# Patient Record
Sex: Female | Born: 1950
Health system: Southern US, Community
[De-identification: ages and names within clinical notes are randomized; demographics above are authoritative.]

## PROBLEM LIST (undated history)

## (undated) ENCOUNTER — Emergency Department (HOSPITAL_COMMUNITY): Payer: PRIVATE HEALTH INSURANCE | Source: Home / Self Care

## (undated) DIAGNOSIS — F419 Anxiety disorder, unspecified: Secondary | ICD-10-CM

## (undated) DIAGNOSIS — R42 Dizziness and giddiness: Secondary | ICD-10-CM

## (undated) DIAGNOSIS — Z972 Presence of dental prosthetic device (complete) (partial): Secondary | ICD-10-CM

## (undated) DIAGNOSIS — E785 Hyperlipidemia, unspecified: Secondary | ICD-10-CM

## (undated) DIAGNOSIS — Z794 Long term (current) use of insulin: Secondary | ICD-10-CM

## (undated) DIAGNOSIS — M1990 Unspecified osteoarthritis, unspecified site: Secondary | ICD-10-CM

## (undated) DIAGNOSIS — G8929 Other chronic pain: Secondary | ICD-10-CM

## (undated) DIAGNOSIS — F329 Major depressive disorder, single episode, unspecified: Secondary | ICD-10-CM

## (undated) DIAGNOSIS — R5383 Other fatigue: Secondary | ICD-10-CM

## (undated) DIAGNOSIS — G4700 Insomnia, unspecified: Secondary | ICD-10-CM

## (undated) DIAGNOSIS — I10 Essential (primary) hypertension: Secondary | ICD-10-CM

## (undated) DIAGNOSIS — E119 Type 2 diabetes mellitus without complications: Secondary | ICD-10-CM

## (undated) DIAGNOSIS — N182 Chronic kidney disease, stage 2 (mild): Secondary | ICD-10-CM

## (undated) DIAGNOSIS — G629 Polyneuropathy, unspecified: Secondary | ICD-10-CM

## (undated) DIAGNOSIS — E1140 Type 2 diabetes mellitus with diabetic neuropathy, unspecified: Secondary | ICD-10-CM

## (undated) DIAGNOSIS — F32A Depression, unspecified: Secondary | ICD-10-CM

## (undated) HISTORY — DX: Polyneuropathy, unspecified: G62.9

## (undated) HISTORY — DX: Major depressive disorder, single episode, unspecified: F32.9

## (undated) HISTORY — DX: Other fatigue: R53.83

## (undated) HISTORY — DX: Insomnia, unspecified: G47.00

## (undated) HISTORY — DX: Type 2 diabetes mellitus with diabetic neuropathy, unspecified: E11.40

## (undated) HISTORY — PX: ABDOMINAL HYSTERECTOMY: SHX81

## (undated) HISTORY — DX: Other chronic pain: G89.29

## (undated) HISTORY — DX: Long term (current) use of insulin: Z79.4

## (undated) HISTORY — DX: Chronic kidney disease, stage 2 (mild): N18.2

## (undated) HISTORY — DX: Hyperlipidemia, unspecified: E78.5

## (undated) HISTORY — DX: Essential (primary) hypertension: I10

## (undated) HISTORY — DX: Depression, unspecified: F32.A

## (undated) HISTORY — DX: Type 2 diabetes mellitus without complications: E11.9

## (undated) HISTORY — PX: COLON SURGERY: SHX602

## (undated) HISTORY — DX: Anxiety disorder, unspecified: F41.9

---

## 2012-10-20 ENCOUNTER — Inpatient Hospital Stay: Payer: Self-pay | Admitting: Specialist

## 2012-10-20 LAB — COMPREHENSIVE METABOLIC PANEL
Albumin: 4.1 g/dL (ref 3.4–5.0)
Alkaline Phosphatase: 101 U/L (ref 50–136)
Anion Gap: 13 (ref 7–16)
BUN: 15 mg/dL (ref 7–18)
Bilirubin,Total: 0.5 mg/dL (ref 0.2–1.0)
Calcium, Total: 9.3 mg/dL (ref 8.5–10.1)
Co2: 21 mmol/L (ref 21–32)
Creatinine: 0.97 mg/dL (ref 0.60–1.30)
EGFR (African American): >60
Osmolality: 286 (ref 275–301)
SGOT(AST): 11 U/L — ABNORMAL LOW (ref 15–37)
Sodium: 136 mmol/L (ref 136–145)

## 2012-10-20 LAB — URINALYSIS, COMPLETE
Bilirubin,UR: NEGATIVE
Blood: NEGATIVE
Ketone: NEGATIVE
Nitrite: NEGATIVE
Specific Gravity: 1.015 (ref 1.003–1.030)

## 2012-10-20 LAB — IRON AND TIBC: Iron Saturation: 3 %

## 2012-10-20 LAB — RETICULOCYTES: Reticulocyte: 2.93 % — ABNORMAL HIGH (ref 0.5–2.2)

## 2012-10-20 LAB — CBC
HCT: 23.4 % — ABNORMAL LOW (ref 35.0–47.0)
HGB: 6.6 g/dL — ABNORMAL LOW (ref 12.0–16.0)
MCV: 61 fL — ABNORMAL LOW (ref 80–100)
RBC: 3.81 10*6/uL (ref 3.80–5.20)
RDW: 18.9 % — ABNORMAL HIGH (ref 11.5–14.5)
WBC: 4.7 10*3/uL (ref 3.6–11.0)

## 2012-10-20 LAB — FERRITIN: Ferritin (ARMC): 3 ng/mL — ABNORMAL LOW (ref 8–388)

## 2012-10-20 LAB — FOLATE: Folic Acid: 16.4 ng/mL (ref 3.1–100.0)

## 2012-10-21 LAB — CBC WITH DIFFERENTIAL/PLATELET
Basophil #: 0 10*3/uL (ref 0.0–0.1)
Basophil %: 0.4 %
Comment - H1-Com5: NORMAL
Eosinophil #: 0 10*3/uL (ref 0.0–0.7)
Eosinophil %: 0.6 %
Eosinophil: 2 %
HCT: 21.5 % — ABNORMAL LOW (ref 35.0–47.0)
Lymphocyte %: 19.6 %
Lymphocytes: 19 %
MCHC: 30.8 g/dL — ABNORMAL LOW (ref 32.0–36.0)
Monocytes: 2 %
Neutrophil %: 72.8 %
RDW: 22 % — ABNORMAL HIGH (ref 11.5–14.5)
Segmented Neutrophils: 77 %
WBC: 4.7 10*3/uL (ref 3.6–11.0)

## 2012-10-21 LAB — HEMOGLOBIN: HGB: 8.9 g/dL — ABNORMAL LOW (ref 12.0–16.0)

## 2012-10-22 LAB — URINE CULTURE

## 2012-11-27 ENCOUNTER — Ambulatory Visit: Payer: Self-pay | Admitting: Emergency Medicine

## 2012-11-27 LAB — CBC WITH DIFFERENTIAL/PLATELET
Basophil #: 0 10*3/uL (ref 0.0–0.1)
Basophil %: 0.5 %
Eosinophil #: 0.1 10*3/uL (ref 0.0–0.7)
Eosinophil %: 2.1 %
HCT: 32.6 % — ABNORMAL LOW (ref 35.0–47.0)
HGB: 10.5 g/dL — ABNORMAL LOW (ref 12.0–16.0)
Lymphocyte %: 19.7 %
MCH: 25.4 pg — ABNORMAL LOW (ref 26.0–34.0)
MCHC: 32.4 g/dL (ref 32.0–36.0)
MCV: 79 fL — ABNORMAL LOW (ref 80–100)
Monocyte %: 5.8 %
Neutrophil #: 4.4 10*3/uL (ref 1.4–6.5)
RBC: 4.15 10*6/uL (ref 3.80–5.20)
RDW: 27.5 % — ABNORMAL HIGH (ref 11.5–14.5)
WBC: 6.1 10*3/uL (ref 3.6–11.0)

## 2012-11-27 LAB — BASIC METABOLIC PANEL
BUN: 14 mg/dL (ref 7–18)
Creatinine: 0.75 mg/dL (ref 0.60–1.30)
Potassium: 3.7 mmol/L (ref 3.5–5.1)
Sodium: 138 mmol/L (ref 136–145)

## 2012-11-28 ENCOUNTER — Inpatient Hospital Stay: Payer: Self-pay | Admitting: Emergency Medicine

## 2012-11-29 LAB — HEMOGLOBIN
HGB: 9.7 g/dL — ABNORMAL LOW (ref 12.0–16.0)
HGB: 9.6 g/dL — ABNORMAL LOW (ref 12.0–16.0)

## 2012-11-29 LAB — HEMOGLOBIN A1C: Hemoglobin A1C: 7.1 % — ABNORMAL HIGH (ref 4.2–6.3)

## 2012-11-30 DIAGNOSIS — I517 Cardiomegaly: Secondary | ICD-10-CM

## 2012-11-30 LAB — COMPREHENSIVE METABOLIC PANEL
Albumin: 2.9 g/dL — ABNORMAL LOW (ref 3.4–5.0)
Alkaline Phosphatase: 68 U/L (ref 50–136)
Anion Gap: 6 — ABNORMAL LOW (ref 7–16)
BUN: 8 mg/dL (ref 7–18)
Bilirubin,Total: 0.8 mg/dL (ref 0.2–1.0)
Calcium, Total: 8.4 mg/dL — ABNORMAL LOW (ref 8.5–10.1)
Chloride: 102 mmol/L (ref 98–107)
Co2: 31 mmol/L (ref 21–32)
Creatinine: 0.73 mg/dL (ref 0.60–1.30)
EGFR (African American): >60
EGFR (Non-African Amer.): >60
Glucose: 153 mg/dL — ABNORMAL HIGH (ref 65–99)
Osmolality: 279 (ref 275–301)
Potassium: 3.1 mmol/L — ABNORMAL LOW (ref 3.5–5.1)
SGOT(AST): 20 U/L (ref 15–37)
SGPT (ALT): 18 U/L (ref 12–78)
Sodium: 139 mmol/L (ref 136–145)
Total Protein: 6.8 g/dL (ref 6.4–8.2)

## 2012-11-30 LAB — CBC WITH DIFFERENTIAL/PLATELET
Eosinophil #: 0 10*3/uL (ref 0.0–0.7)
Eosinophil %: 0.6 %
HCT: 28.2 % — ABNORMAL LOW (ref 35.0–47.0)
Lymphocyte #: 0.4 10*3/uL — ABNORMAL LOW (ref 1.0–3.6)
Lymphocyte %: 6.8 %
MCHC: 33 g/dL (ref 32.0–36.0)
MCV: 79 fL — ABNORMAL LOW (ref 80–100)
Neutrophil #: 5.6 10*3/uL (ref 1.4–6.5)
Neutrophil %: 87.9 %
RBC: 3.57 10*6/uL — ABNORMAL LOW (ref 3.80–5.20)
RDW: 27 % — ABNORMAL HIGH (ref 11.5–14.5)

## 2012-12-01 LAB — BASIC METABOLIC PANEL
Anion Gap: 5 — ABNORMAL LOW (ref 7–16)
Calcium, Total: 8.3 mg/dL — ABNORMAL LOW (ref 8.5–10.1)
Creatinine: 0.6 mg/dL (ref 0.60–1.30)
EGFR (Non-African Amer.): >60
Osmolality: 281 (ref 275–301)
Potassium: 3.3 mmol/L — ABNORMAL LOW (ref 3.5–5.1)
Sodium: 141 mmol/L (ref 136–145)

## 2012-12-01 LAB — PATHOLOGY REPORT

## 2012-12-02 LAB — BASIC METABOLIC PANEL
Anion Gap: 3 — ABNORMAL LOW (ref 7–16)
Co2: 33 mmol/L — ABNORMAL HIGH (ref 21–32)
Osmolality: 279 (ref 275–301)
Sodium: 139 mmol/L (ref 136–145)

## 2012-12-06 LAB — CULTURE, BLOOD (SINGLE)

## 2012-12-26 ENCOUNTER — Ambulatory Visit: Payer: Self-pay | Admitting: Oncology

## 2012-12-27 ENCOUNTER — Ambulatory Visit: Payer: Self-pay | Admitting: Oncology

## 2012-12-27 LAB — CBC CANCER CENTER
Eosinophil #: 0 x10 3/mm (ref 0.0–0.7)
Eosinophil %: 0.4 %
MCH: 25.8 pg — ABNORMAL LOW (ref 26.0–34.0)
MCHC: 33.1 g/dL (ref 32.0–36.0)
Monocyte %: 6.5 %
Neutrophil #: 5.8 x10 3/mm (ref 1.4–6.5)
Platelet: 200 x10 3/mm (ref 150–440)
RBC: 4.8 10*6/uL (ref 3.80–5.20)

## 2012-12-27 LAB — COMPREHENSIVE METABOLIC PANEL
Alkaline Phosphatase: 96 U/L (ref 50–136)
BUN: 11 mg/dL (ref 7–18)
Co2: 30 mmol/L (ref 21–32)
Creatinine: 0.9 mg/dL (ref 0.60–1.30)
EGFR (Non-African Amer.): >60
Osmolality: 276 (ref 275–301)
SGOT(AST): 11 U/L — ABNORMAL LOW (ref 15–37)
SGPT (ALT): 19 U/L (ref 12–78)
Total Protein: 8.5 g/dL — ABNORMAL HIGH (ref 6.4–8.2)

## 2012-12-29 LAB — CEA: CEA: 4.6 ng/mL (ref 0.0–4.7)

## 2013-01-02 ENCOUNTER — Ambulatory Visit: Payer: Self-pay | Admitting: Oncology

## 2013-02-01 ENCOUNTER — Ambulatory Visit: Payer: Self-pay | Admitting: Oncology

## 2013-02-18 ENCOUNTER — Emergency Department: Payer: Self-pay | Admitting: Internal Medicine

## 2013-02-18 LAB — COMPREHENSIVE METABOLIC PANEL
Alkaline Phosphatase: 72 U/L (ref 50–136)
BUN: 8 mg/dL (ref 7–18)
Bilirubin,Total: 0.5 mg/dL (ref 0.2–1.0)
Calcium, Total: 9.4 mg/dL (ref 8.5–10.1)
Chloride: 100 mmol/L (ref 98–107)
Creatinine: 0.81 mg/dL (ref 0.60–1.30)
EGFR (Non-African Amer.): >60
Osmolality: 282 (ref 275–301)
SGOT(AST): 25 U/L (ref 15–37)
Sodium: 135 mmol/L — ABNORMAL LOW (ref 136–145)
Total Protein: 7.8 g/dL (ref 6.4–8.2)

## 2013-02-18 LAB — CBC
HCT: 36.5 % (ref 35.0–47.0)
HGB: 12.2 g/dL (ref 12.0–16.0)
MCHC: 33.3 g/dL (ref 32.0–36.0)
RDW: 15.2 % — ABNORMAL HIGH (ref 11.5–14.5)

## 2013-04-03 ENCOUNTER — Ambulatory Visit: Payer: Self-pay | Admitting: Oncology

## 2013-04-06 ENCOUNTER — Emergency Department: Payer: Self-pay | Admitting: Emergency Medicine

## 2013-04-06 LAB — BASIC METABOLIC PANEL
Co2: 30 mmol/L (ref 21–32)
Creatinine: 0.94 mg/dL (ref 0.60–1.30)
EGFR (Non-African Amer.): >60
Glucose: 226 mg/dL — ABNORMAL HIGH (ref 65–99)
Sodium: 141 mmol/L (ref 136–145)

## 2013-04-06 LAB — CBC WITH DIFFERENTIAL/PLATELET
Basophil %: 0.3 %
Eosinophil #: 0 10*3/uL (ref 0.0–0.7)
HGB: 12.4 g/dL (ref 12.0–16.0)
Lymphocyte #: 1.1 10*3/uL (ref 1.0–3.6)
Lymphocyte %: 18.2 %
MCH: 27.4 pg (ref 26.0–34.0)
MCV: 81 fL (ref 80–100)
Monocyte #: 0.3 x10 3/mm (ref 0.2–0.9)
Monocyte %: 5.4 %
Neutrophil %: 75.6 %
Platelet: 193 10*3/uL (ref 150–440)
RBC: 4.53 10*6/uL (ref 3.80–5.20)
RDW: 16.7 % — ABNORMAL HIGH (ref 11.5–14.5)
WBC: 6.2 10*3/uL (ref 3.6–11.0)

## 2013-04-11 ENCOUNTER — Ambulatory Visit: Payer: Self-pay

## 2013-04-25 LAB — CBC CANCER CENTER
Basophil %: 0.5 %
Eosinophil #: 0 x10 3/mm (ref 0.0–0.7)
Eosinophil %: 0.7 %
HCT: 37.8 % (ref 35.0–47.0)
HGB: 13.4 g/dL (ref 12.0–16.0)
MCHC: 35.3 g/dL (ref 32.0–36.0)
Monocyte %: 6.4 %
Neutrophil %: 60.8 %
Platelet: 197 x10 3/mm (ref 150–440)
RBC: 4.57 10*6/uL (ref 3.80–5.20)
RDW: 17.3 % — ABNORMAL HIGH (ref 11.5–14.5)
WBC: 4.1 x10 3/mm (ref 3.6–11.0)

## 2013-04-25 LAB — COMPREHENSIVE METABOLIC PANEL
Albumin: 3.9 g/dL (ref 3.4–5.0)
Anion Gap: 4 — ABNORMAL LOW (ref 7–16)
Calcium, Total: 9.4 mg/dL (ref 8.5–10.1)
Co2: 35 mmol/L — ABNORMAL HIGH (ref 21–32)
EGFR (African American): >60
Glucose: 239 mg/dL — ABNORMAL HIGH (ref 65–99)
Osmolality: 284 (ref 275–301)
SGPT (ALT): 17 U/L (ref 12–78)
Sodium: 139 mmol/L (ref 136–145)
Total Protein: 7.3 g/dL (ref 6.4–8.2)

## 2013-04-26 LAB — CEA: CEA: 1.9 ng/mL (ref 0.0–4.7)

## 2013-05-04 ENCOUNTER — Ambulatory Visit: Payer: Self-pay | Admitting: Oncology

## 2013-09-11 LAB — HM COLONOSCOPY

## 2013-10-26 ENCOUNTER — Ambulatory Visit: Payer: Self-pay | Admitting: Oncology

## 2013-11-22 ENCOUNTER — Ambulatory Visit: Payer: Self-pay | Admitting: Oncology

## 2013-11-22 LAB — CBC CANCER CENTER
Basophil #: 0 x10 3/mm (ref 0.0–0.1)
Basophil %: 0.4 %
Eosinophil #: 0.1 x10 3/mm (ref 0.0–0.7)
Eosinophil %: 1.7 %
HCT: 40.2 % (ref 35.0–47.0)
HGB: 13.3 g/dL (ref 12.0–16.0)
LYMPHS ABS: 0.8 x10 3/mm — AB (ref 1.0–3.6)
LYMPHS PCT: 15.6 %
MCH: 29.8 pg (ref 26.0–34.0)
MCHC: 33 g/dL (ref 32.0–36.0)
MCV: 90 fL (ref 80–100)
MONO ABS: 0.2 x10 3/mm (ref 0.2–0.9)
Monocyte %: 4.1 %
NEUTROS PCT: 78.2 %
Neutrophil #: 4.1 x10 3/mm (ref 1.4–6.5)
PLATELETS: 180 x10 3/mm (ref 150–440)
RBC: 4.45 10*6/uL (ref 3.80–5.20)
RDW: 13.3 % (ref 11.5–14.5)
WBC: 5.2 x10 3/mm (ref 3.6–11.0)

## 2013-11-22 LAB — COMPREHENSIVE METABOLIC PANEL
ANION GAP: 9 (ref 7–16)
Albumin: 3.9 g/dL (ref 3.4–5.0)
Alkaline Phosphatase: 72 U/L
BILIRUBIN TOTAL: 0.5 mg/dL (ref 0.2–1.0)
BUN: 13 mg/dL (ref 7–18)
CHLORIDE: 95 mmol/L — AB (ref 98–107)
CREATININE: 1 mg/dL (ref 0.60–1.30)
Calcium, Total: 8.6 mg/dL (ref 8.5–10.1)
Co2: 28 mmol/L (ref 21–32)
EGFR (African American): >60
EGFR (Non-African Amer.): 60 — ABNORMAL LOW
Glucose: 537 mg/dL (ref 65–99)
Osmolality: 289 (ref 275–301)
Potassium: 4.6 mmol/L (ref 3.5–5.1)
SGOT(AST): 14 U/L — ABNORMAL LOW (ref 15–37)
SGPT (ALT): 20 U/L (ref 12–78)
Sodium: 132 mmol/L — ABNORMAL LOW (ref 136–145)
TOTAL PROTEIN: 7.5 g/dL (ref 6.4–8.2)

## 2013-11-23 LAB — CEA: CEA: 4.9 ng/mL — ABNORMAL HIGH (ref 0.0–4.7)

## 2013-12-02 ENCOUNTER — Ambulatory Visit: Payer: Self-pay | Admitting: Oncology

## 2014-01-30 ENCOUNTER — Ambulatory Visit: Payer: Self-pay

## 2014-01-30 LAB — HM MAMMOGRAPHY

## 2014-04-09 ENCOUNTER — Telehealth: Payer: Self-pay

## 2014-04-09 NOTE — Telephone Encounter (Signed)
Message opened in error

## 2014-08-16 DIAGNOSIS — E1129 Type 2 diabetes mellitus with other diabetic kidney complication: Secondary | ICD-10-CM | POA: Insufficient documentation

## 2014-08-16 DIAGNOSIS — Z794 Long term (current) use of insulin: Secondary | ICD-10-CM | POA: Insufficient documentation

## 2014-08-16 DIAGNOSIS — E1149 Type 2 diabetes mellitus with other diabetic neurological complication: Secondary | ICD-10-CM | POA: Insufficient documentation

## 2014-08-16 DIAGNOSIS — G629 Polyneuropathy, unspecified: Secondary | ICD-10-CM | POA: Insufficient documentation

## 2014-08-16 DIAGNOSIS — R809 Proteinuria, unspecified: Secondary | ICD-10-CM

## 2014-08-29 IMAGING — CR DG LUMBAR SPINE 2-3V
1 series · 3 of 3 positions shown · non-contrast
Comparison: none

REASON FOR EXAM: back pain
COMMENTS:

PROCEDURE:     DXR - DXR LUMBAR SPINE AP AND LATERAL  - April 06, 2013  [DATE]
RESULT:     Comparison: None

[Series 1: t lumbar spine ap · 0.14mm/px · 3 of 3 slices shown]
[im 1/3]
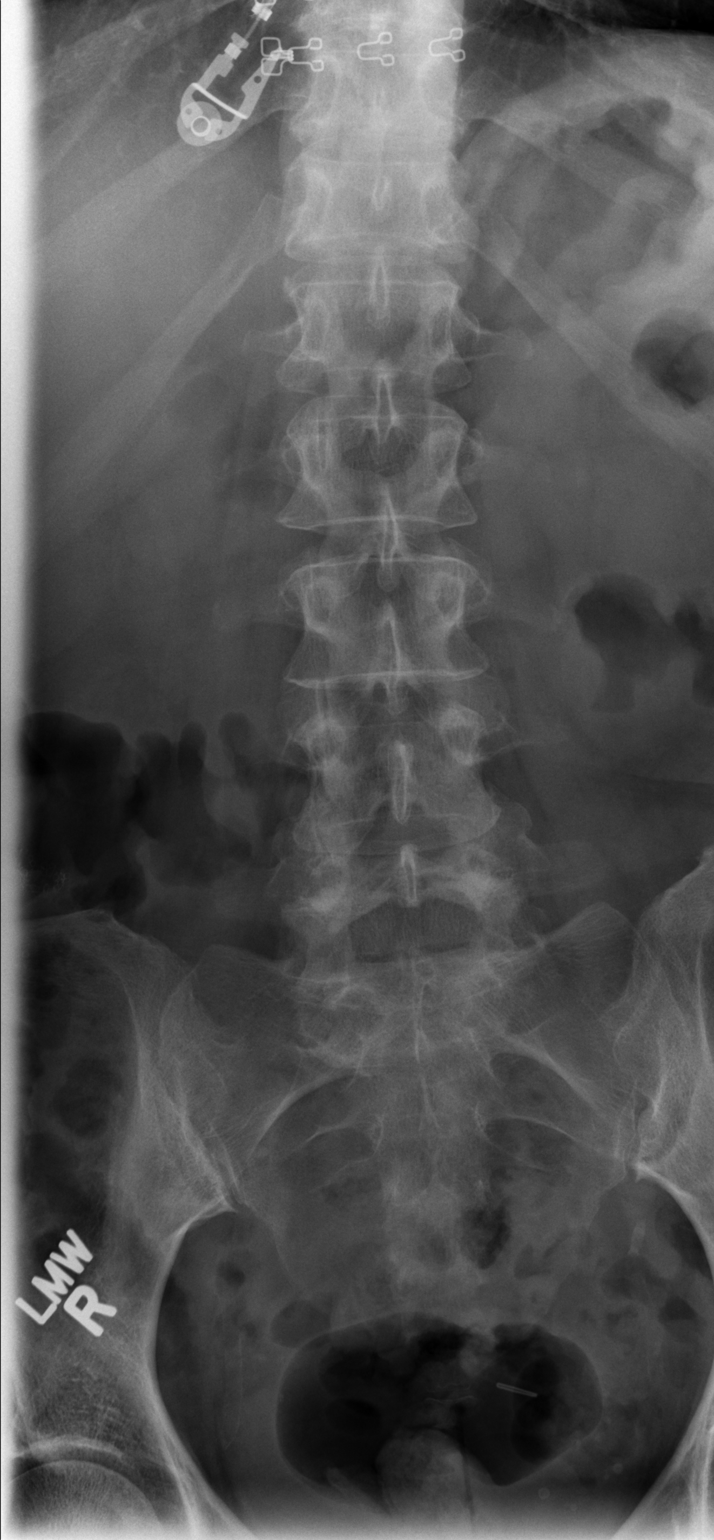
[im 2/3]
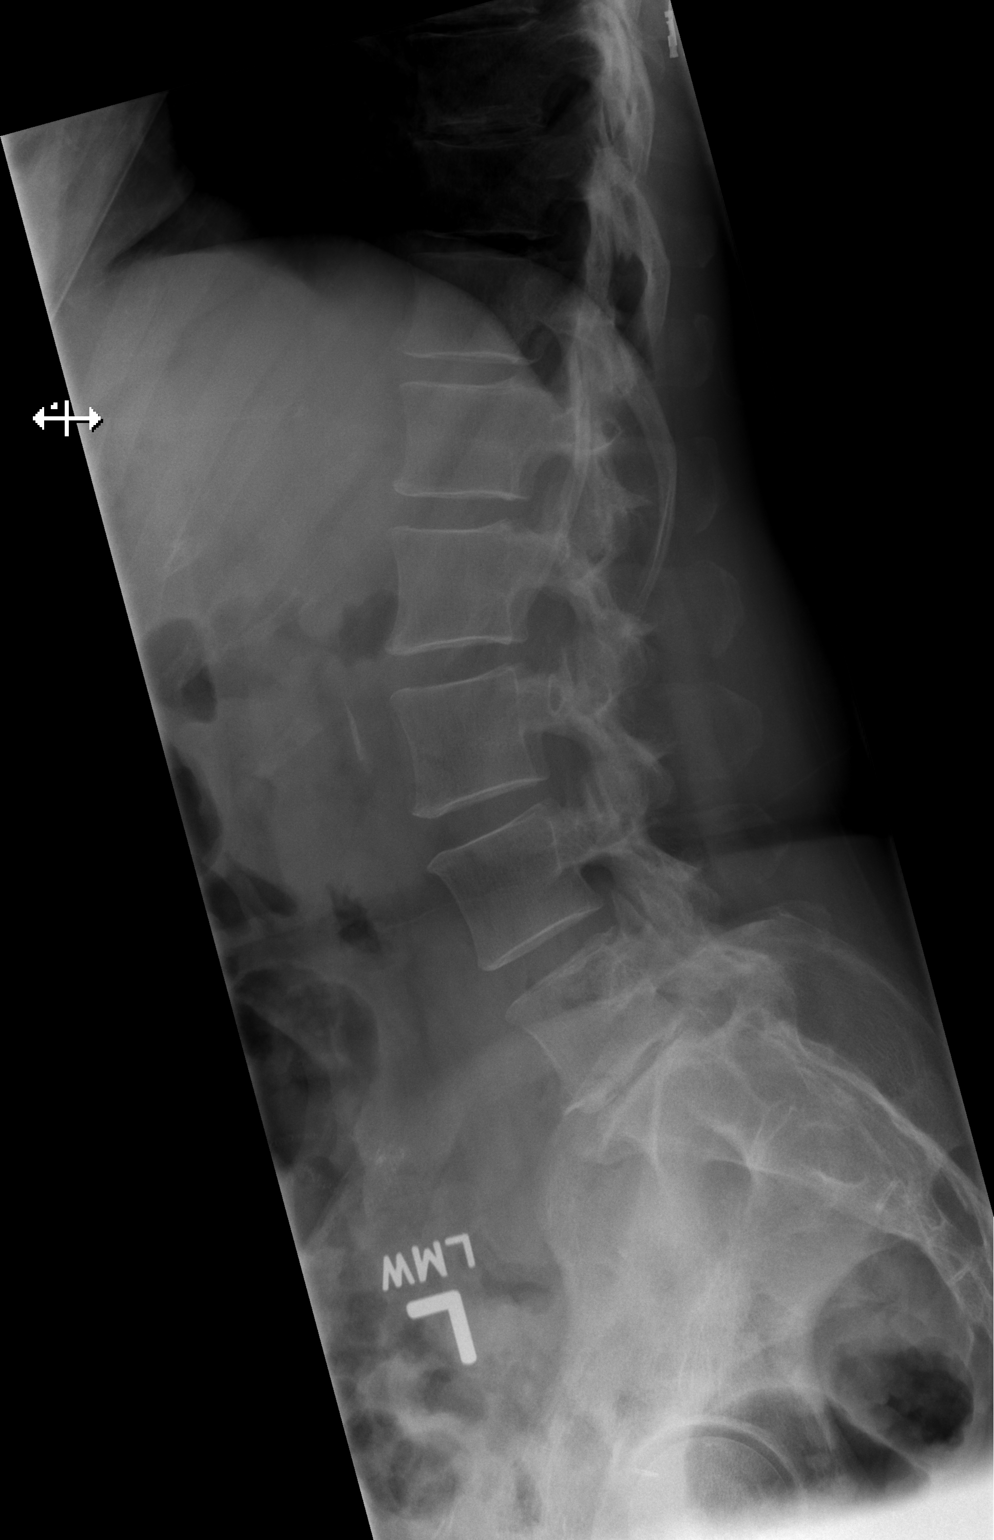
[im 3/3]
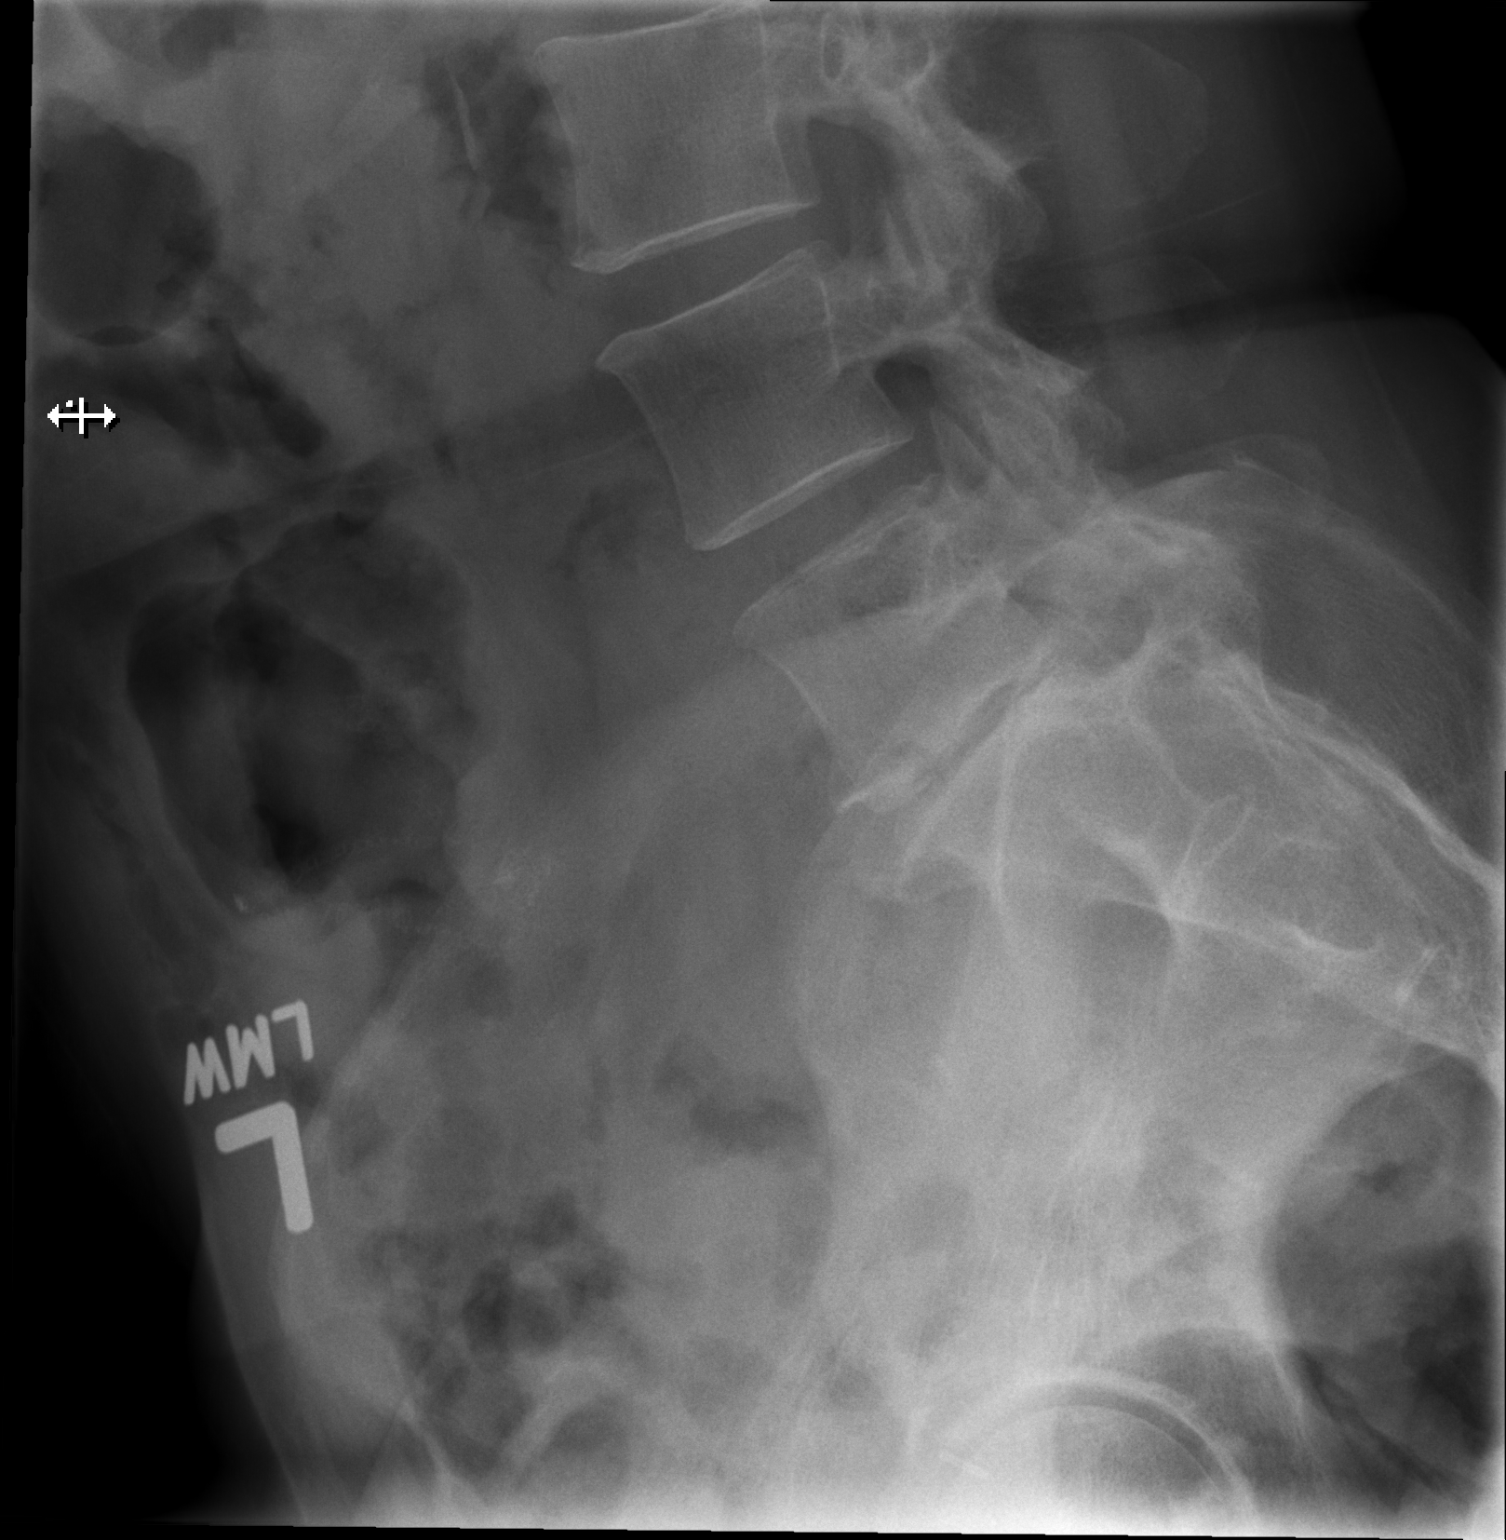

[3 of 3 positions shown; findings below may reference images not displayed]

FINDINGS: AP and lateral views of the lumbar spine and a coned down view of the
lumbosacral junction are provided.

There are 5 nonrib bearing lumbar-type vertebral bodies. The vertebral body
heights are maintained. The alignment is anatomic. There is no
spondylolysis. There is no acute fracture or static listhesis. There is
degenerative disc disease at L5-S1.

The SI joints are unremarkable.
IMPRESSION: 1. No acute osseous abnormality of the lumbar spine.

[REDACTED]

## 2015-01-24 NOTE — Consult Note (Signed)
Brief Consult Note: Comments: Patient admitted with symptomatic anemia most likely secondary to chronic GI blood loss. No signs of active GI bleed such as melena, hematochezia etc. Case discussed with Dr. Verdell Carmine yesterday and agree with OP folow up in 1-2 weeks for an OP colonoscopy/ EGD. Discussed with patient and she is in full agreement. She is being discharged after completing blood transfusion and will see her as above. Please call me if needed. Thanks.  Electronic Signatures: Jill Side (MD)  (Signed 18-Jan-14 12:07)  Authored: Brief Consult Note   Last Updated: 18-Jan-14 12:07 by Jill Side (MD)

## 2015-01-24 NOTE — H&P (Signed)
PATIENT NAME:  Jessica Montoya, Jessica Montoya MR#:  [id number] DATE OF BIRTH:  [birth year]  DATE OF ADMISSION:  10/20/2012  PRIMARY CARE PHYSICIAN: Dr. Golden Pop.   CHIEF COMPLAINT: Weakness, fatigue and dizziness.   HISTORY OF PRESENT ILLNESS: This is a 64 year old female who presents from her primary care physician's office due to weakness, fatigue and dizziness. The patient says she has been feeling like that for the past few days to a few weeks. The patient apparently was seen by her primary care physician's office in October of last year and was told to be somewhat anemic, but cannot recall what her hemoglobin was. She has been feeling increasingly fatigued, weak and dizzy and therefore went to see them, had a repeat CBC that showed a hemoglobin of 6.3. She was then referred to the ER for further evaluation. The patient denies any chest pain. She denies any shortness of breath. She denies any nausea, vomiting, abdominal pain. She denies any melena, any hematochezia, any hematuria, any hemoptysis or hematemesis. Given her symptomatic anemia and need for transfusion, hospitalist services were called in for further treatment, evaluation and admission.   REVIEW OF SYSTEMS:  CONSTITUTIONAL: No documented fever. Positive fatigue. Positive weakness. Positive 8-pound weight loss. No weight gain.  EYES: No blurred or double vision.  ENT: No tinnitus, no postnasal drip, no redness of the oropharynx.  RESPIRATORY: No cough, no wheeze, no hemoptysis, no dyspnea.  CARDIOVASCULAR: No chest pain, no orthopnea, no palpitations, no syncope.  GASTROINTESTINAL: No nausea, no vomiting, no diarrhea, no abdominal pain, no melena, no hematochezia.  GENITOURINARY: No dysuria or hematuria.  ENDOCRINE: No polyuria or nocturia. No heat or cold intolerance.  HEMATOLOGIC: Positive anemia. No acute bruising or bleeding.  INTEGUMENTARY: No rashes, no lesions.  MUSCULOSKELETAL: No arthritis, no swelling, no gout.  NEUROLOGIC: No  numbness, no tingling, no ataxia, no seizure-type activity.  PSYCHIATRIC: No anxiety, no insomnia, no ADD.   PAST MEDICAL HISTORY: Consistent with diabetes, hypertension, GERD, depression.   ALLERGIES: No known drug allergies.   SOCIAL HISTORY: No smoking. No alcohol abuse. No illicit drug abuse. Lives at home with her husband.   FAMILY HISTORY: The patient's mother died from complications of breast cancer and melanoma. Father died from pancreatic cancer.   CURRENT MEDICATIONS: Prozac 20 mg daily, metformin 1000 mg b.i.d., Lotensin 20 mg daily, Lantus 60 units in the morning and 72 units in the evening, lansoprazole 30 mg daily, Apidra 2 to 10 units with a sliding scale, Ambien 10 mg at bedtime as needed, Prilosec 20 mg daily.   PHYSICAL EXAMINATION: On admission:  VITAL SIGNS: Temperature is 99, pulse 92, respiration 20, blood pressure 124/48, sats 100% on room air.  GENERAL: She is a pleasant appearing female, in no apparent distress.  HEENT: Atraumatic, normocephalic. Her extraocular muscles are intact. Her pupils are equal and reactive to light. Sclerae anicteric. No conjunctival injection. Positive conjunctival pallor. No oropharyngeal erythema.  NECK: Supple. There is no jugular venous distention, no bruits, no lymphadenopathy, no thyromegaly.  HEART: Regular rate and rhythm, tachycardic. No murmurs, rubs or clicks.  LUNGS: Clear to auscultation bilaterally. No rales or rhonchi. No wheezes.  ABDOMEN: Soft, flat, nontender, nondistended. Has good bowel sounds. No hepatosplenomegaly appreciated.  EXTREMITIES: No evidence of any cyanosis, clubbing or peripheral edema. Has +2 pedal and radial pulses bilaterally.  NEUROLOGIC: The patient is alert, awake, oriented x 3 with no focal motor or sensory deficits appreciated bilaterally.  SKIN: Moist and warm with no rash appreciated.  LYMPHATIC: There is no cervical or axillary lymphadenopathy.   LABORATORY DATA: Serum glucose 343, BUN 15,  creatinine 0.9, sodium 136, potassium 3.9, chloride 102, bicarbonate 21. LFTs are within normal limits. White cell count 4.7, hemoglobin 6.6, hematocrit 23.4, platelet count 316, MCV 61.   ASSESSMENT AND PLAN: This is a 64 year old female with a history of diabetes, gastroesophageal reflux disease, depression and hypertension who presented to the hospital with weakness, fatigue and dizziness and with symptomatic anemia.  1.  Symptomatic microcytic anemia: The patient presented with fatigue, weakness and dizziness. Likely secondary to her anemia. I will go ahead and transfuse her 1 unit of packed red blood cells, follow serial hemoglobins. Will check iron, TIBC, ferritin, B12, folate and reticulocyte count. The patient is heme-negative from below. She has never had a screening colonoscopy and likely will need one as an outpatient. I discussed with Dr. Dionne Milo. He said to set her up an appointment with his office within a week after discharge. Will follow her clinically.  2.  Diabetes: Seems to be slightly uncontrolled. I will continue her Lantus, metformin and Apidra sliding scale. Follow blood sugars closely. Place her on a carb-controlled diet. 3.  Gastroesophageal reflux disease: Continue Prilosec.  4.  Hypertension: Blood pressure has been on the lower side. Hold her Lotensin for now.  5.  Depression: Continue Prozac.   CODE STATUS: The patient is a full code.   TIME SPENT: 45 minutes.   ____________________________ Belia Heman. Verdell Carmine, MD vjs:jm D: 10/20/2012 17:52:04 ET T: 10/20/2012 20:04:05 ET JOB#: [id number]  cc: Belia Heman. Verdell Carmine, MD, <Dictator> Henreitta Leber MD ELECTRONICALLY SIGNED 10/20/2012 21:04

## 2015-01-24 NOTE — Discharge Summary (Signed)
PATIENT NAME:  TISH, BEGIN MR#:  [id number] DATE OF BIRTH:  [birth year]  DATE OF ADMISSION:  10/20/2012 DATE OF DISCHARGE:  10/21/2012  ADMITTING DIAGNOSIS:  Anemia.   DISCHARGE DIAGNOSES:  1. Severe symptomatic iron deficiency anemia, status post 3 units of packed red blood cells transfusion.  2. Fatigue, generalized weakness, dizziness.  3. Urinary tract infection.  4. History of diabetes mellitus.  5. Hypertension.  6. Gastroesophageal reflux disease.  7. Depression.   DISCHARGE CONDITION: Stable.   DISCHARGE MEDICATIONS: The patient is to resume her outpatient medications which are: 1. Zolpidem 10 mg p.o. at bedtime as needed.  2. Apidra sliding scale and 5 to 6 units subcutaneously 3 times daily after meals.  3. Lantus 60 units subcutaneously once in the morning and 72 units subcutaneously in the evening.  4. Lotensin 20 mg p.o. daily.  5. Metformin 500 mg, 2 tablets which would be 1000 mg twice daily.  6. Fluoxetine 20 mg p.o. daily.   New medications:  1. Omeprazole 40 mg p.o. twice daily.  2. Ciprofloxacin 500 mg p.o. twice daily for 3days.  3. Vitamin C 500 mg p.o. once daily.  4. Feosol 325 mg p.o. 3 times a day.   NOTE: The patient is not to take Latanoprost or aspirin.   HOME OXYGEN: None.  DIET: 2 grams salt, low fat, low cholesterol, carbohydrate-controlled diet, regular consistency.   ACTIVITY LIMITATIONS: As tolerated.   FOLLOW-UP APPOINTMENT: With Dr. Dionne Milo in 1 to 2 weeks after discharge, as well as Dr. Jeananne Rama in 2 to 3 days after discharge.    CONSULTANTS: Dr. Dionne Milo.   RADIOLOGIC STUDIES: None   HISTORY AND PHYSICAL: The patient is a 64 year old Caucasian female with history of diabetes who presented to the hospital with complaints of fatigue, weakness, as well as dizziness. Please refer to Dr. Edward Jolly admission on 08/20/2013.  Apparently, the patient was seen by her primary care physician and had her CBC done which revealed a hemoglobin level  of 6.3. She was referred to the Emergency Room for further evaluation. In the Emergency Room, she had guaiac of her stool checked, and it was negative; but because of symptomatic anemia, hospitalist services were contacted for admission.   On the day of admission, temperature was 99, pulse was 92, respiratory rate was 20, blood pressure 124/48, and saturation was 100% on room air. The physical exam was unremarkable.   HOSPITAL LABORATORY DATA: The patient's lab data done on the day of admission, 10/20/2012, showed glucose of 343. BMP was unremarkable. The patient's folic acid level was normal at 16.4. Iron in serum level, however, was only 15, iron binding capacity was elevated at 473, iron saturation was only 3, and ferritin level was only 3.0. The patient's liver enzymes revealed a total protein elevated to 8.3, otherwise unremarkable study. The patient's CBC white blood cell count was 4.7, hemoglobin was 6.6, and platelet count 316 with MCV low at 61. The patient's reticulocyte count was elevated at 2.93, absolute reticulocyte count was 0.0912.  The patient's urinalysis revealed hazy yellow urine, more than 500 glucose, negative for bilirubin or ketones, specific gravity 1.015, pH was 5.0, negative for blood, protein, nitrites, 3+ leukocytes esterase, 7 red blood cells, 9 white blood cells, trace bacteria was noted as well as 5 epithelial cells and mucous was present. Vitamin B12 level was normal at 314.   HOSPITAL COURSE: The patient was transfused with 1 unit of packed red blood cells. After transfusion, however, in the morning  of 10/21/2013, the patient's hemoglobin level was still found to be 6.6. It was felt that the patient would benefit from 2 more units of packed red blood cell transfusion, and very likely the patient's hemoglobin level was somewhat low because of rehydration which he received in the Emergency Room. If, after 2 more units of packed red blood cell transfusion on 10/21/2012 the  patient's hemoglobin level is better, the patient will likely be discharged home. The patient was seen by Dr. Dionne Milo, consultant, on 10/21/2012 before the patient's symptomatic anemia, most likely secondary to chronic GI blood loss. However, no signs of active GI bleed such as melena, hematochezia were noted. He recommended for the patient to follow up with him in the next 1 to 2 weeks after discharge for outpatient colonoscopy as well as EGD.  This was discussed with the patient, and she was in full agreement. The patient will be likely discharged today after completing blood transfusion. The patient is to hold her aspirin therapy. Her proton pump inhibitor was increased to 40 mg twice daily dose. She is also to continue iron supplementation 45 by vitamin C orally.   In regards to other medical problems such as urinary tract infection, the patient is to continue antibiotic therapy for a few more days to complete course. For chronic medical problems such as diabetes, hypertension, gastroesophageal reflux disease as well as depression, the patient is to continue her outpatient medications.   DISPOSITION: The patient is being discharged in stable condition with the above-mentioned medications and followup. Her hemoglobin level A1c will be checked later after transfusion, and depending on the level, we will make decisions about discharge.   During the patient's transfusion, the patient's vital signs were temperature 97.4, pulse 89, respiration rate was 18, blood pressure 110/65, saturation 96% on room air at rest.   TIME SPENT:  40 minutes.   ____________________________ Theodoro Grist, MD rv:cb D: 10/21/2012 18:12:28 ET T: 10/22/2012 19:59:57 ET JOB#: [id number]  cc: Theodoro Grist, MD, <Dictator> Guadalupe Maple, MD [place] MD ELECTRONICALLY SIGNED 10/29/2012 21:38

## 2015-01-24 NOTE — Op Note (Signed)
PATIENT NAME:  Jessica Montoya, RYBKA MR#:  [id number] DATE OF BIRTH:  06-20-1951  DATE OF PROCEDURE:  11/28/2012  PREOPERATIVE DIAGNOSIS: Cancer of the cecum.   POSTOPERATIVE DIAGNOSIS: Ventral hernia.   PROCEDURE:  1.  Exploratory laparotomy and lysis of small bowel adhesions for partial small bowel obstruction due to multiple adhesions in the previous surgery.  2.  Partial omentectomy. 3.  Left hemicolectomy for cancer of the cecum.   SURGEON: Sharene Butters, M.D.   FINDINGS: This patient had previous history of endometriosis for which she had a hysterectomy and bilateral salpingo-oophorectomy. The patient had massive adhesions. Preoperatively she was complaining of pain in the left side of the abdomen and in the suprapubic area.  The tumor she was found to have on the cecum because she was found to have anemia and it was a lesion, which was next to the cecum. The patient had a CAT scan  done in the office and a CT scan was basically negative but her CEA was elevated over 7.  I really could not feel any big mass in the abdomen. I thought I could but she was an obese lady and I able to feel the mass before surgery.   OPERATIVE FINDINGS: The patient had massive small bowel adhesions. There was an area of the proximal small bowel which looked like a volvulus because of the adhesions and it was stuck in the left lower quadrant on the previous bladder flap which was then dissected.  All the loops of small bowel were then straightened out.  It took more time to straighten the small bowel loop and finally, after releasing all the adhesions we were able to do the left hemicolectomy and took part of the omentum  for possible metastatic disease to the pathologist.   OPERATIVE PROCEDURE:  Partial small bowel obstruction release:  After the patient was put to sleep, the abdomen was then prepped and draped. Foley catheter was inserted. The abdomen opened from the right paramedian incision. After cutting skin and  subcutaneous tissue, the fascia was cut, the muscle was separated and the peritoneum was entered. After entering the abdomen, first exploration revealed that the patient had no liver metastasis. I could not feel any major lymph nodes. I could feel a  mass in the cecum, but the cecum was way down in the pelvis stuck to the previous surgery site.  First of all, I grasped the small bowel and went to the ligament of Treitz and started the jejunum.  The middle of the jejunum was stuck to each other and then stuck down to the pelvis like a volvulus.  I dissected that off completely and taken off from of the bladder flap and then straightened out all the small bowel.  One by one, there were massive adhesions down there and took almost an hour and a half to do that.  The terminal ileum was the most difficult part. It was twisted on itself and was going into the bladder flap area behind the cecum. The cecum the cecum itself was also stuck to the retroperitoneal structures as well stuck down to the bladder flap. It was also then dissected off of the right ureter and we dissected off of all the cecum and the terminal ileum, completely freeing from the adhesions. After that I put a lap pad there to stop the oozing of the blood again ran all the small bowel.  Then the omentum and the right side of the omentum showed like metastatic  nodules. I omentectomy on that side completely. The other side looked okay and there was no other obvious disease anywhere else, but the cecum. The right hemicolectomy was then performed by taking the blood vessels in tying with 0 Vicryl suture. Also used the Harmonic scalpel here and there also and transected the transverse colon with the GIA stapler and the terminal ileum with the GIA stapler and then and did a side-to-side anastomosis of the terminal ileum to the transverse colon with the GIA and then closed with a TA stapler. The mesocolon was then closed to close the rent space. After that,  irrigation of the abdomen was performed to make sure there was no bleeding. Again, I saw the ureter going okay and after this was done, the liver was palpated again, okay and there was no tumor in the liver seen. The abdomen was closed in routine fashion, after nurses told me all the sponge count and instrument counts were correct. The peritoneum was closed with a running 0 Vicryl suture. The fascia was closed with interrupted 0 Vicryl sutures. Skin was closed with staples.   SUMMARY OF THE CASE: The patient had advanced tumor of the right colon due to cecum had metastatic nodules in omentum and in between the small bowel I did not see any tumor.  The small bowel was stuck to the pelvic floor with multiple adhesions from the previous surgery which was the most difficult part of the surgery.  I did not see any liver metastasis.  Overall she did very well and she tolerated the procedure well and was sent to the recovery room in satisfactory condition.    ____________________________ Welford Roche Phylis Bougie, MD msh:ct D: 11/28/2012 14:17:58 ET T: 11/28/2012 14:56:53 ET JOB#: [id number]  cc: Demani Mcbrien S. Phylis Bougie, MD, <Dictator> Hervey Ard. Julian Hy, NP Jill Side, MD Wendee Copp Sherilyn Banker MD ELECTRONICALLY SIGNED 11/29/2012 12:48

## 2015-01-24 NOTE — Discharge Summary (Signed)
Dates of Admission and Diagnosis:  Date of Admission 28-Nov-2012   Date of Discharge 03-Dec-2012   Admitting Diagnosis cancer cecum   Final Diagnosis cancer ceccum    Chief Complaint/History of Present Illness pt had colonoscopy showed lesion in the cecum and had right hemicolectomy done with partial omentectomy   Chief Complaint/History of Present Illness cont'd pt was found to be anemic   LabObservation:  27-Feb-14 19:23   OBSERVATION Reason for Test  Hepatic:  27-Feb-14 11:41   Bilirubin, Total 0.8  Alkaline Phosphatase 68  SGPT (ALT) 18  SGOT (AST) 20  Total Protein, Serum 6.8  Albumin, Serum  2.9  Pathology:  25-Feb-14 00:12   Pathology Report ========== TEST NAME ==========  ========= RESULTS =========  = REFERENCE RANGE =  PATHOLOGY REPORT  Pathology Report .                               [   Final Report         ]                   Material submitted:         [name] PART A: INTESTINAL MASS PART B: COLON, RIGHT .                               [   Final Report         ]                   Pre-operative diagnosis:                                        . INTESTINAL MASS, EXP LAP RIGHT HEMICOLECTOMY .                               [   Final Report         ]                   Frozen section diagnosis:                                       . FSA1. REPRESENTATIVE SECTION:       - METASTATIC ADENOCARCINOMA.       - CALLED TO DR. Marion AT 1:27 PMON 11/28/12. . DR. TARA RUBINAS Aram Beecham .                               [   Final Report         ]                   ********************************************************************** Diagnosis: Part A: OMENTUM, EXCISION: - NEGATIVE FOR MALIGNANCY. - SEE COMMENT. . Part B: RIGHT COLON, RIGHT HEMICOLECTOMY: - INVASIVE COLORECTAL ADENOCARCINOMA. - SEE CANCER SUMMARY BELOW. - TUBULAR ADENOMA, 0.9 CM. - APPENDIX NOT IDENTIFIED. [name] ONCOLOGY SUMMARY: COLON AND RECTUM, RESECTION, AJCC 7TH EDITION Specimen: right colon with  terminal ileum, omentum Procedure: right hemicolectomy with biopsy of omentum Tumor site: cecum Tumor size: 3.9  cm Macroscopic tumor perforation: not specified Histologic type: invasive adenocarcinoma Histologic grade: well differentiated Microscopic tumor extension: into but not through muscularis propria Margins:      Proximal margin: negative      Distal margin: negative      Circumferential (radial) or mesenteric margin: negative      Distanceof invasive carcinoma from closest margin: 3.5 cm to retroperitoneal margin Treatment effect: not applicable Lymph-vascular invasion: not identified Perineural invasion: not identified Tumor deposits (discontinuous extramural extension): not identified Pathologic staging:      Primary tumor: pT2      Regional lymph nodes: pN0           Number of nodes examined: 18           Number of nodes involved: 0      Distant metastases: not applicable . Comment Representative sections of partA (omental fat) were submitted for frozen section. On the frozen section slide, fragments of carcinoma are present within incomplete sections of difficult to cut fat. However, on the permanent sections, several deeper sections, and multiple additional sections carcinoma is not identified. These slides underwent intra departmental review and the presence of carcinoma in the frozen section slide is interpreted as likely contamination in the laboratory. The omentum is negative for malignancy. These findings were discussed with Dr. Phylis Bougie on 12/01/12. Of note, per outside pathology report of the patients prior biopsy of the cecal mass immunohistochemical stains for MSH6 and PMS2 are normal. This indicates that the tumor is most likely microsatellite stable and that Lynch syndrome is very unlikely. RUB/12/01/2012 ********************************************************************** .                               [   Final Report         ]                    Electronically signed:                                     [name] Hulda Humphrey, MD, Pathologist .                               [   Final Report         ]                   Gross description:                                         . A. Received fresh for frozen section labeled Jessica Montoya and carcinoma of cecum is a 15.5 x 11.5 x 2.2 cm portion of apparent omental fat. The exterior surfaces are mottled pink yellow to red purple.  Sectioning demonstrates ill defined slightly firm areas. Four representative sections are submitted for frozen section as FSA1-FSA2. The frozen section remnants are entirely resubmitted in cassettes A1-A2. No definitive gross discrete masses are grossly seen. Additional representative sections are submitted for routine processing in cassettes A3-A4. A5-9 - Additional representative sections. . B. Right colon: Received in a formalin filled container labeled Jessica Montoya is a 34.5 cm in length segment of bowel including the terminal ileum  and attached right colon.The segment of terminal ileum measures 11.0 cm in length. Within the cecum is a 3.9 X 3.0 X 1.5 cm pink tan to red tan sessile polypoid mass. The serosal surfaces overlying the mass demonstrate focal pink purple adhesions. Sectioning demonstratesa pink white slightly friable cut surface. Gross invasion demonstrates an apparent focal transmural extension into the attached pericolonic fat. The mass is situated 13.0 cm from the proximal margin and 18.6 cm from the distal margin. The mass issituated 3.5 cm from the retroperitoneal margin. Distal from the mass is a 0.9 X 0.5 X 0.4 cm pink tan sessile polyp located 3.2 cm from the distal margin. Sectioning of the polyp demonstrates no discreet gross invasion. Elsewhere the mucosa is tan pink with grossly normal transverse folds. The appendix is not present. Palpation of the attached fat demonstrates 19 lymph node candidates ranging from 0.4 to 1.0  cm in greatest dimensions. Representative sections are submitted; B1- proximal and distal margins en fosse; B2-B5 - mass; B6- polyp; B7- representative sections of terminal ileum and right colon; B8- five intact lymph node candidates; B9- four intact lymph node candidates; B10- one bisected lymph node candidate; B11- four intact lymph node candidates; B12- five intact lymph node candidates. XAC . QAC/KCT .                               [   Final Report         ]                   Pathologist provided ICD-9: 153.4 .                               [   Final Report        ]                   CPT                                                        . [id number], [id number], [phone], [place]            No: [id number]           [address], [place], [place] [address]           [place], [place]                                         [phone] [id number]   Result(s) reported on 01 Dec 2012 at 04:47PM.  Routine BB:  24-Feb-14 15:53   Crossmatch Unit 1 Cancelled  Crossmatch Unit 2 Cancelled  Routine Micro:  27-Feb-14 11:41   Micro Text Report BLOOD CULTURE   COMMENT                   NO GROWTH IN 48 HOURS   ANTIBIOTIC  Culture Comment NO GROWTH IN 48 HOURS  Result(s) reported on 02 Dec 2012 at 08:40AM.  Cardiology:  27-Feb-14 13:11   Echo Doppler REASON FOR EXAM:     COMMENTS:     PROCEDURE: [facility] - ECHO DOPPLER  - Nov 30 2012  1:11PM   RESULT: Transthoracic Echocardiogram Report  Patient Name:   Jessica Montoya Date of Exam: 11/30/2012 Medical Rec #:  [id number]              Custom1: Date of Birth:  July 30, 1951           Height:       66.0 in Patient Age:    29 years            Weight:       156.0 lb Patient Gender: F                   BSA:          1.80 m??  Indications: Shortness of Breath Sonographer:    Janalee Dane RCS Referring Phys: Phylis Bougie, MASUD, S  2D AND M-MODE  MEASUREMENTS (normal ranges within parentheses): Left Ventricle:          Normal    Aorta/Left Atrium:    Normal IVSd (2D):      0.93 cm (0.7-1.1) Aortic Root (Mmode):  (2.4-3.7) LVPWd (2D):     0.93cm (0.7-1.1) AoV Cusp Separation:  (1.5-2.6) LVIDd (2D):     3.95 cm (3.4-5.7) Left Atrium (Mmode):  (1.9-4.0) LVIDs (2D):     2.27 cm LV FS (2D):     42.5 %   (>25%)   Aorta/LA:                  Normal LV EF (2D):     74.2 %   (>50%)   Aortic Root (2D): 2.90 cm (2.4-3.7)                                   Left Atrium (2D): 4.50 cm (1.9-4.0)                                    Right Ventricle:                                   RVd (2D): LV DIASTOLIC FUNCTION: MV Peak E: 1.15 m/s E/e' Ratio: 10.50 MV Peak A: 1.12 m/s Decel Time: 116 msec E/A Ratio: 1.03 SPECTRAL DOPPLER ANALYSIS (where applicable): Mitral Valve: MV P1/2 Time: 33.64 msec MV Area, PHT: 6.54 cm??  PROCEDURE: After discussion of the risks and benefits of the TEE, an informed consent was obtained. The TEE probe was passed without  difficulty. The patient's vital signs; including heart rate, blood  pressure, and oxygen saturation; remained stable throughout the  procedure. The patient tolerated the procedure well and without  complications. PHYSICIAN INTERPRETATION: Left Ventricle: Normal left ventricular size and wall thicknesses, with  normal systolic and diastolic function. The left ventricular internal  cavity size was normal. LV posterior wall thickness was normal. Left  ventricular ejection fraction, by visual estimation, is 55 to 60%. Right Ventricle: Normal right ventricular size, wall thickness, and  systolic function. Left Atrium: The left atrium is normal in size and structure. Theleft  atrium is  mildly dilated. Right Atrium: The right atrium is normal in size and structure. Pericardium: There is no evidence of pericardial effusion. There is  pleural effusion in either the left or right lateral region. Mitral  Valve: Structurally normal mitral valve, with normal leaflet  excursion; without any evidence of mitral stenosis or significant  regurgitation. Tricuspid Valve: Structurally normal tricuspid valve, with normal leaflet  excursion. The tricuspid valve is normal. Trivial tricuspid regurgitation  is visualized. Aortic Valve: The aortic valve is trileaflet and structurally normal,  with normal leaflet excursion; without any evidence of aortic stenosis or  insufficiency. Pulmonic Valve: Structurally normalpulmonic valve, with normal leaflet  excursion. Trace pulmonic valve regurgitation. Aorta: The aortic root and ascending aorta are structurally normal, with  no evidence of dilitation. Pulmonary Artery: The main pulmonary artery is normal in size, origin and  position; with normal bifurcation into the left and right pulmonary  arteries. Shunts: There is no evidence of a patent ductus arteriosus. There is no  evidence of a patent foramen ovale. No ventricular septal defect is seen   or detected. There is no evidence of an atrial septal defect.  Summary:  1. Sinus tachycardia noted  2. Normal Echocardiogram.  3. Left ventricular ejection fraction, by visual estimation, is 55 to  60%.  4. All cardiac valves are normal in structure and function.  5. Mildly dilated left atrium.  6. RVSP is within normal range. [id number] Ida Rogue MD Electronically signed by [id number] Ida Rogue MD Signature Date/Time: 11/30/2012/7:27:04 PM  *** Final *** IMPRESSION: .    Verified By: Minna Merritts, M.D., MD  Routine Chem:  24-Feb-14 15:53   Result Comment XMATCH - PRODUCTS NOT USED FOR SURGERY  Result(s) reported on 30 Nov 2012 at 02:58PM.  26-Feb-14 13:38   Hemoglobin A1c [facility])  7.1 (The American Diabetes Association recommends that a primary goal of therapy should be <7% and that physicians should reevaluate the treatment regimen in patients with HbA1c values consistently >8%.)  27-Feb-14  11:41   Glucose, Serum  153  BUN 8  Creatinine (comp) 0.73  Sodium, Serum 139  Potassium, Serum  3.1  Chloride, Serum 102  CO2, Serum 31  Calcium (Total), Serum  8.4  Anion Gap  6  Osmolality (calc) 279  eGFR (African American) >60  eGFR (Non-African American) >60 (eGFR values <64m/min/1.73 m2 may be an indication of chronic kidney disease (CKD). Calculated eGFR is useful in patients with stable renal function. The eGFR calculation will not be reliable in acutely ill patients when serum creatinine is changing rapidly. It is not useful in  patients on dialysis. The eGFR calculation may not be applicable to patients at the low and high extremes of body sizes, pregnant women, and vegetarians.)  28-Feb-14 05:51   Magnesium, Serum  1.4 (1.8-2.4 THERAPEUTIC RANGE: 4-7 mg/dL TOXIC: > 10 mg/dL  -----------------------)  Glucose, Serum  129  BUN 7  Creatinine (comp) 0.60  Sodium, Serum 141  Potassium, Serum  3.3  Chloride, Serum 105  CO2, Serum 31  Calcium (Total), Serum  8.3  Anion Gap  5  Osmolality (calc) 281  eGFR (African American) >60  eGFR (Non-African American) >60 (eGFR values <65mmin/1.73 m2 may be an indication of chronic kidney disease (CKD). Calculated eGFR is useful in patients with stable renal function. The eGFR calculation will not be reliable in acutely ill patients when serum creatinine is changing rapidly. It is not useful in  patients on dialysis. The eGFR calculation may not be  applicable to patients at the low and high extremes of body sizes, pregnant women, and vegetarians.)  01-Mar-14 05:41   Magnesium, Serum 2.3 (1.8-2.4 THERAPEUTIC RANGE: 4-7 mg/dL TOXIC: > 10 mg/dL  -----------------------)  Glucose, Serum  161  BUN  6  Creatinine (comp) 0.60  Sodium, Serum 139  Potassium, Serum  3.4  Chloride, Serum 103  CO2, Serum  33  Calcium (Total), Serum  8.1  Anion Gap  3  Osmolality (calc) 279  eGFR (African American) >60  eGFR (Non-African  American) >60 (eGFR values <38m/min/1.73 m2 may be an indication of chronic kidney disease (CKD). Calculated eGFR is useful in patients with stable renal function. The eGFR calculation will not be reliable in acutely ill patients when serum creatinine is changing rapidly. It is not useful in  patients on dialysis. The eGFR calculation may not be applicable to patients at the low and high extremes of body sizes, pregnant women, and vegetarians.)  Routine Hem:  26-Feb-14 13:38   Hemoglobin (CBC)  9.7 (Result(s) reported on 29 Nov 2012 at 02:20PM.)    20:24   Hemoglobin (CBC)  9.6 (Result(s) reported on 29 Nov 2012 at 08:57PM.)  27-Feb-14 04:19   Hemoglobin (CBC)  9.1 (Result(s) reported on 30 Nov 2012 at 05:32AM.)    11:41   WBC (CBC) 6.4  RBC (CBC)  3.57  Hemoglobin (CBC)  9.3  Hematocrit (CBC)  28.2  Platelet Count (CBC) 200  MCV  79  MCH 26.0  MCHC 33.0  RDW  27.0  Neutrophil % 87.9  Lymphocyte % 6.8  Monocyte % 4.3  Eosinophil % 0.6  Basophil % 0.4  Neutrophil # 5.6  Lymphocyte #  0.4  Monocyte # 0.3  Eosinophil # 0.0  Basophil # 0.0 (Result(s) reported on 30 Nov 2012 at 12:18PM.)   PERTINENT RADIOLOGY STUDIES: XRay:    26-Feb-14 22:41, Chest Portable Single View  Chest Portable Single View   REASON FOR EXAM:    shortness of breath  COMMENTS:       PROCEDURE: DXR - DXR PORTABLE CHEST SINGLE VIEW  - Nov 29 2012 10:41PM     RESULT: Comparison: None    Findings:     Single portable AP chest radiograph is provided. There is bilateral   diffuse interstitial thickening likely representing interstitial edema   versus interstitial pneumonitis secondary to an infectious or   inflammatory etiology. There is no pleural effusion or pneumothorax.   Normal cardiomediastinal silhouette. The osseous structures are   unremarkable.  There is a small amount of pneumoperitoneum under the right hemidiaphragm   which may be related to recent hemicolectomy.    IMPRESSION:      1. There is bilateral diffuse interstitial thickening likely representing   interstitial edema versus interstitial pneumonitis secondary to an   infectious or inflammatory etiology.    2. There is a small amount of pneumoperitoneum under the right   hemidiaphragm which may be related to recent hemicolectomy.    Dictation Site: 1      Verified By: HJennette Banker M.D., MVincoDid very well and statrted to move bowels and discharged without any complication   Condition on Discharge Good   DISCHARGE INSTRUCTIONS HOME MEDS:  Medication Reconciliation: Patient's Home Medications at Discharge:     Medication Instructions  zolpidem 10 mg oral tablet  1 tab(s) orally once a day (at bedtime) as needed for sleep.   omeprazole 40 mg oral delayed release  capsule  1 cap(s) orally 2 times a day   vitamin c 500 mg oral tablet  1 tab(s) orally once a day   feosol 325 mg oral tablet  1 tab(s) orally 3 times a day   fluoxetine 20 mg oral capsule  1 cap(s) orally once a day (at bedtime)   benazepril 40 mg oral tablet  1 tab(s) orally once a day (in the morning)   acetaminophen-oxycodone 325 mg-5 mg oral tablet  1 tab(s) orally every 4 hours, As needed, pain   percocet 5/325  1 tab(s) orrally 4 times a day, As Needed    PRESCRIPTIONS: PRINTED AND PLACED ON CHART   Physician's Instructions:  Home Health? No   Treatments None   Dressing Care Stitches have been used to close your incision.  Keep this area clean and dry.  Keep the bandage in place unless it becomes loose or soiled.  keep staples open   Home Oxygen? No   Diet Carbohydrate Controlled (ADA) Diet   Diet Consistency Regular Consistency   Activity Limitations No exertional activity   Referrals endocrinologist   Return to Work after follow up visit with MD   Time frame for Follow Up Appointment 1-2 weeks  friday   Electronic Signatures: Vella Kohler (MD)  (Signed 02-Mar-14  11:37)  Authored: ADMISSION DATE AND DIAGNOSIS, CHIEF COMPLAINT/HPI, PERTINENT LABS, [place], PATIENT INSTRUCTIONS   Last Updated: 02-Mar-14 11:37 by Vella Kohler (MD)

## 2015-01-24 NOTE — Consult Note (Signed)
PATIENT NAME:  Jessica Montoya, Jessica Montoya MR#:  [id number] DATE OF BIRTH:  1951-06-07  DATE OF CONSULTATION:  11/29/2012  REFERRING PHYSICIAN:  Dr. Trey Paula.   CONSULTING PHYSICIAN:  Jessica Montoya, Jessica Montoya  REASON FOR CONSULTATION:  Medical management. The patient underwent a right hemicolectomy for cancer of the cecum.   HISTORY OF PRESENT ILLNESS:  [name] is a 64 year old pleasant Caucasian female with a history of systemic hypertension, diabetes mellitus type 2, and gastroesophageal reflux disease, recently found to have anemia, and workup revealed colon cancer located at the cecal area. The patient was admitted to the hospital and underwent a right hemicolectomy and exploratory laparotomy and lysis of small bowel adhesions. Along with that underwent also a partial omentectomy. Postoperatively, the patient is doing well, however today she developed a generalized itching sensation. She is receiving PCA using morphine for pain control.   Regarding her diabetes she is on insulin, sliding-scale. The patient is still n.p.o.   REVIEW OF SYSTEMS:  CONSTITUTIONAL:  Denies having any fever. No chills. No fatigue.  EYES:  No blurred vision. No double vision.  ENT: No hearing impairment. No sore throat. No dysphagia.  CARDIOVASCULAR:  No chest pain. No shortness of breath. No edema.  RESPIRATORY:  No cough. No sputum production. No chest pain. No shortness of breath.  GASTROINTESTINAL:  No abdominal pain other than the tenderness at the site of the surgery, and no vomiting. No diarrhea.  GENITOURINARY:  No dysuria. No frequency of urination.  MUSCULOSKELETAL:  No joint pain. No swelling. No muscular pain or swelling.  INTEGUMENTARY:  No skin rash. No ulcers, but she has generalized itching.  NEUROLOGIC:  No focal weakness. No seizure activity. No headache.  PSYCHIATRIC:  She has no anxiety, but she has a history of depression.  ENDOCRINE:  No polyuria or polydipsia. No heat or cold intolerance.   PAST  MEDICAL HISTORY:  Systemic hypertension, diabetes mellitus type 2,  gastroesophageal reflux disease, depression.   SOCIAL HABITS:  Nonsmoker. No history of alcohol or other drug abuse.   SOCIAL HISTORY:  The patient is married, living with her husband.   FAMILY HISTORY:  Her mother suffered from melanoma with metastasis, and then at the end of her life she also suffered from breast cancer. Her father died from pancreatic cancer. Two of her grandmothers died from cancer.   MEDICATIONS:  Prozac 20 mg a day, Lantus 60 units in the morning and 60 units in the evening, Apidra; she uses that only occasionally for concurrent blood sugar according to sliding scale, Ambien 10 mg at bedtime. Lotensin 20 mg a day, metformin 1000 mg twice a day, Prozac 20 mg a day, Prilosec 20 mg a day.   ALLERGIES:  No known drug allergies.   Blood pressure 116/70, respiratory rate 16, pulse 99, temperature 98.2, oxygen saturation 93%.   GENERAL APPEARANCE:  This is an elderly female lying in bed in no acute distress.  HEAD AND NECK EXAMINATION:  No pallor. No icterus. No cyanosis.  EAR EXAMINATION:  Reveals normal hearing. No discharge, no lesions. No ulcers. Nasal mucosa was normal, without ulcers. No discharge. Oropharyngeal area was normal, without oral thrush. No exudates.  EYE EXAMINATION:  Revealed normal eyelids and conjunctivae. Pupils are about 4 to 5 mm, round, equal, and sluggishly reactive to light.  NECK:  Supple. Trachea is midline. No thyromegaly. No cervical lymphadenopathy. No masses.  HEART EXAM:  Normal S1, S2. No S3, S4. No murmur. No gallop. No carotid  bruits.  RESPIRATORY EXAM:  Showed normal breathing pattern without use of accessory muscles. No rales. No wheezing.  ABDOMEN:  Soft, without tenderness. No hepatosplenomegaly. She is slightly tender around the incision site of the midline. The incision is covered with a clean dressing, without any evidence of discharge or bleeding.  MUSCULOSKELETAL:   No joint swelling. No clubbing.  SKIN EXAM:  No ulcers. No subcutaneous nodules.  NEUROLOGIC:  Cranial nerves II-XII were intact. No focal motor deficit.  PSYCHIATRIC EVALUATION:  The patient is alert and oriented x 3. Mood and affect were normal.   LABORATORY FINDINGS:  Had a glucose of 140; subsequent Accu-Cheks showed 216, 222, 226. BUN 14, creatinine 0.7, sodium 138, potassium 3.7, calcium 8.6.   CBC showed a white count of 6000, hemoglobin 10.5, hematocrit 32.6, platelet count 210.   ASSESSMENT: 1.  Perioperative medical evaluation.  2.  Patient is status post right hemicolectomy due to presence of cancer of the cecum. The patient also underwent adhesiolysis and partial omentectomy.  3.  Diabetes mellitus, type 2.  4.  Systemic hypertension.  5.  The patient developed generalized itching, likely secondary to the morphine use.  6.  Iron deficiency anemia.  PLAN: Benadryl was given to the patient, with resolution of her itching. Continue Accu-Cheks and insulin sliding-scale. Her Lantus is on-hold for the time being, and also metformin since the patient is n.p.o.  These need to be resumed once she is able to tolerate oral feeding.   I thank Dr. Andres Shad for asking [place] to participate in this pleasant patient's care. The medical team will continue to follow up on her condition.   Time spent in evaluating this patient took more than 30 minutes.      ____________________________ Jessica Montoya, Jessica Montoya amd:dm D: 11/29/2012 06:05:00 ET T: 11/29/2012 08:54:31 ET JOB#: [id number]  cc: Jessica Montoya, Jessica Montoya, <Dictator> Jessica Montoya ELECTRONICALLY SIGNED 11/30/2012 0:15

## 2015-04-21 LAB — HM DIABETES EYE EXAM

## 2015-04-28 ENCOUNTER — Other Ambulatory Visit: Payer: Self-pay | Admitting: Unknown Physician Specialty

## 2015-04-28 MED ORDER — RAMIPRIL 2.5 MG PO CAPS: 2.5000 mg | ORAL_CAPSULE | Freq: Every day | ORAL | Status: DC

## 2015-04-28 NOTE — Telephone Encounter (Signed)
Routing to provider. Patient was last seen on 09/11/14 with no follow-up indicated, practice partner number is [id number], and pharmacy is [facility] in [place].

## 2015-04-28 NOTE — Telephone Encounter (Signed)
E-Fax came through for refill on: Rx: Ramipril Copy in basket

## 2015-05-05 ENCOUNTER — Other Ambulatory Visit: Payer: Self-pay

## 2015-05-05 NOTE — Telephone Encounter (Signed)
Opened encounter for med refill, but I noticed that the medication was already refilled.

## 2015-05-21 ENCOUNTER — Encounter: Payer: Self-pay | Admitting: Unknown Physician Specialty

## 2015-05-21 ENCOUNTER — Ambulatory Visit (INDEPENDENT_AMBULATORY_CARE_PROVIDER_SITE_OTHER): Payer: BLUE CROSS/BLUE SHIELD | Admitting: Unknown Physician Specialty

## 2015-05-21 DIAGNOSIS — F5101 Primary insomnia: Secondary | ICD-10-CM

## 2015-05-21 DIAGNOSIS — E1165 Type 2 diabetes mellitus with hyperglycemia: Secondary | ICD-10-CM

## 2015-05-21 DIAGNOSIS — N181 Chronic kidney disease, stage 1: Secondary | ICD-10-CM

## 2015-05-21 DIAGNOSIS — F32A Depression, unspecified: Secondary | ICD-10-CM

## 2015-05-21 DIAGNOSIS — E785 Hyperlipidemia, unspecified: Secondary | ICD-10-CM

## 2015-05-21 DIAGNOSIS — I129 Hypertensive chronic kidney disease with stage 1 through stage 4 chronic kidney disease, or unspecified chronic kidney disease: Secondary | ICD-10-CM

## 2015-05-21 DIAGNOSIS — E1142 Type 2 diabetes mellitus with diabetic polyneuropathy: Secondary | ICD-10-CM

## 2015-05-21 DIAGNOSIS — N182 Chronic kidney disease, stage 2 (mild): Secondary | ICD-10-CM | POA: Diagnosis not present

## 2015-05-21 DIAGNOSIS — G894 Chronic pain syndrome: Secondary | ICD-10-CM

## 2015-05-21 DIAGNOSIS — G629 Polyneuropathy, unspecified: Secondary | ICD-10-CM

## 2015-05-21 DIAGNOSIS — K219 Gastro-esophageal reflux disease without esophagitis: Secondary | ICD-10-CM

## 2015-05-21 DIAGNOSIS — I10 Essential (primary) hypertension: Secondary | ICD-10-CM

## 2015-05-21 DIAGNOSIS — N185 Chronic kidney disease, stage 5: Secondary | ICD-10-CM

## 2015-05-21 DIAGNOSIS — F329 Major depressive disorder, single episode, unspecified: Secondary | ICD-10-CM

## 2015-05-21 DIAGNOSIS — E119 Type 2 diabetes mellitus without complications: Secondary | ICD-10-CM

## 2015-05-21 DIAGNOSIS — N183 Chronic kidney disease, stage 3 (moderate): Secondary | ICD-10-CM

## 2015-05-21 DIAGNOSIS — N189 Chronic kidney disease, unspecified: Secondary | ICD-10-CM | POA: Diagnosis not present

## 2015-05-21 DIAGNOSIS — IMO0002 Reserved for concepts with insufficient information to code with codable children: Secondary | ICD-10-CM | POA: Insufficient documentation

## 2015-05-21 DIAGNOSIS — E1159 Type 2 diabetes mellitus with other circulatory complications: Secondary | ICD-10-CM | POA: Insufficient documentation

## 2015-05-21 DIAGNOSIS — N184 Chronic kidney disease, stage 4 (severe): Secondary | ICD-10-CM

## 2015-05-21 DIAGNOSIS — E1122 Type 2 diabetes mellitus with diabetic chronic kidney disease: Secondary | ICD-10-CM | POA: Insufficient documentation

## 2015-05-21 DIAGNOSIS — E1169 Type 2 diabetes mellitus with other specified complication: Secondary | ICD-10-CM | POA: Insufficient documentation

## 2015-05-21 DIAGNOSIS — F419 Anxiety disorder, unspecified: Secondary | ICD-10-CM

## 2015-05-21 DIAGNOSIS — Z794 Long term (current) use of insulin: Secondary | ICD-10-CM

## 2015-05-21 DIAGNOSIS — E1140 Type 2 diabetes mellitus with diabetic neuropathy, unspecified: Secondary | ICD-10-CM | POA: Insufficient documentation

## 2015-05-21 LAB — BAYER DCA HB A1C WAIVED: HB A1C: 9.5 % — AB (ref <7.0)

## 2015-05-21 LAB — LIPID PANEL PICCOLO, WAIVED
CHOL/HDL RATIO PICCOLO,WAIVE: 2.6 mg/dL
CHOLESTEROL PICCOLO, WAIVED: 125 mg/dL (ref <200)
HDL CHOL PICCOLO, WAIVED: 47 mg/dL — AB (ref >59)
LDL CHOL CALC PICCOLO WAIVED: 33 mg/dL (ref <100)
TRIGLYCERIDES PICCOLO,WAIVED: 225 mg/dL — AB (ref <150)
VLDL Chol Calc Piccolo,Waive: 45 mg/dL — ABNORMAL HIGH (ref <30)

## 2015-05-21 MED ORDER — ZOLPIDEM TARTRATE 10 MG PO TABS: 10.0000 mg | ORAL_TABLET | Freq: Every day | ORAL | Status: DC

## 2015-05-21 MED ORDER — FLUOXETINE HCL 20 MG PO CAPS: 20.0000 mg | ORAL_CAPSULE | Freq: Every day | ORAL | Status: DC

## 2015-05-21 MED ORDER — INSULIN GLARGINE 100 UNIT/ML SOLOSTAR PEN: 60.0000 [IU] | PEN_INJECTOR | Freq: Every day | SUBCUTANEOUS | Status: DC

## 2015-05-21 MED ORDER — GABAPENTIN 300 MG PO CAPS: 300.0000 mg | ORAL_CAPSULE | Freq: Every day | ORAL | Status: DC

## 2015-05-21 MED ORDER — SIMVASTATIN 40 MG PO TABS: 40.0000 mg | ORAL_TABLET | Freq: Every day | ORAL | Status: DC

## 2015-05-21 MED ORDER — INSULIN GLULISINE 100 UNIT/ML SOLOSTAR PEN: 15.0000 [IU] | PEN_INJECTOR | Freq: Three times a day (TID) | SUBCUTANEOUS | Status: DC

## 2015-05-21 MED ORDER — OMEPRAZOLE 20 MG PO CPDR: 20.0000 mg | DELAYED_RELEASE_CAPSULE | Freq: Every day | ORAL | Status: DC

## 2015-05-21 MED ORDER — RAMIPRIL 2.5 MG PO CAPS: 2.5000 mg | ORAL_CAPSULE | Freq: Every day | ORAL | Status: DC

## 2015-05-21 NOTE — Assessment & Plan Note (Addendum)
Agreed to wean off Ambien.  Continue Gabapentin 300 mg QHS.

## 2015-05-21 NOTE — Assessment & Plan Note (Signed)
Taking Ramipril for kidney protection.  But, we will stop and seee if it helps her dizzyness.  Will need to restart it.

## 2015-05-21 NOTE — Progress Notes (Signed)
BP 111/71 mmHg  Pulse 105  Ht 5' 6.2" (1.681 m)  Wt 176 lb 9.6 oz (80.105 kg)  BMI 28.35 kg/m2  SpO2 96%  LMP  (LMP Unknown)   Subjective:    Patient ID: Jessica Montoya, female    DOB: Nov 28, 1950, 64 y.o.   MRN: [id number]  HPI: Jessica Montoya is a 64 y.o. female  Chief Complaint  Patient presents with  . Medication Refill    pt states she needs all medications refilled, also wants to talk about medications   Diabetes Has been seeing Endocrine but missed an appointment.  States her numbers "have been running high." 190 this AM but 336 yesterday.  She is taking 50 units of Lantus at night.  Taking Apidra sliding scale 16-18 with meals.  Later in the day BS is around 180.    Hypertension This is a chronic problem. The problem is controlled. The current treatment provides significant improvement. There are no compliance problems.   Hyperlipidemia This is a chronic problem. The problem is controlled. There are no compliance problems.    Vertigo Has been seeing Dr. Richardson Landry.  She is now set up with Dr. Manuella Ghazi.  He wants her to talk to me with any medications that cause dizzyness.  She has cut back to Gabapentin QHS and it helps her restless legs.  Discussed the Ambien which helps her sleep through the night.    Relevant past medical, surgical, family and social history reviewed and updated as indicated. Interim medical history since our last visit reviewed. Allergies and medications reviewed and updated.  Review of Systems  Per HPI unless specifically indicated above     Objective:    BP 111/71 mmHg  Pulse 105  Ht 5' 6.2" (1.681 m)  Wt 176 lb 9.6 oz (80.105 kg)  BMI 28.35 kg/m2  SpO2 96%  LMP  (LMP Unknown)  Wt Readings from Last 3 Encounters:  05/21/15 176 lb 9.6 oz (80.105 kg)  09/11/14 180 lb (81.647 kg)    Physical Exam  Results for orders placed or performed in visit on 05/21/15  HM MAMMOGRAPHY  Result Value Ref Range   HM Mammogram from [facility]   HM  COLONOSCOPY  Result Value Ref Range   HM Colonoscopy from [facility]       Assessment & Plan:   Problem List Items Addressed This Visit      Unprioritized   Depression    ENT suggested Fluoxetine may be contributing to dizzyness but I am reluctant to change as has bee on it for a number of years.  Substituting with something else should have similar or worse potential side effects.        Hyperlipidemia   Relevant Orders   Lipid Panel Piccolo, Waived   Primary insomnia    Agreed to wean off Ambien.  Continue Gabapentin 300 mg QHS.        Hypertensive CKD (chronic kidney disease)    Suspect Ramipril may be contributing to dizzyness but needs an ACE or ARB for kidney protection.        Relevant Orders   Lipid Panel Piccolo, Waived   Microalbumin, Urine Waived   Uric acid   Comprehensive metabolic panel    Other Visit Diagnoses    diabetes uncontrolled with chronic kidney        Relevant Orders    Bayer DCA Hb A1c Waived        Follow up plan: Return in about 4 weeks (around  06/18/2015).       

## 2015-05-21 NOTE — Assessment & Plan Note (Signed)
Recommeded to titrate Lantus up.  Written instructions given.  Make appointment with endocrinologist.

## 2015-05-21 NOTE — Patient Instructions (Signed)
Base your long acting insulin on your fasting (usually in the morning) blood sugar.  Increase long acting (daily insulin) 2 units if fasting blood sugar is greater than 120.  Decrease by 2 units if fasting blood sugar is less than 95.    

## 2015-05-21 NOTE — Assessment & Plan Note (Signed)
ENT suggested Fluoxetine may be contributing to dizzyness but I am reluctant to change as has bee on it for a number of years.  Substituting with something else should have similar or worse potential side effects.

## 2015-05-21 NOTE — Assessment & Plan Note (Signed)
Suspect Ramipril may be contributing to dizzyness but needs an ACE or ARB for kidney protection.

## 2015-05-22 ENCOUNTER — Telehealth: Payer: Self-pay | Admitting: Unknown Physician Specialty

## 2015-05-22 ENCOUNTER — Encounter: Payer: Self-pay | Admitting: Unknown Physician Specialty

## 2015-05-22 LAB — COMPREHENSIVE METABOLIC PANEL
ALBUMIN: 4.1 g/dL (ref 3.6–4.8)
ALT: 15 IU/L (ref 0–32)
AST: 14 IU/L (ref 0–40)
Albumin/Globulin Ratio: 1.9 (ref 1.1–2.5)
Alkaline Phosphatase: 59 IU/L (ref 39–117)
BILIRUBIN TOTAL: 0.4 mg/dL (ref 0.0–1.2)
BUN / CREAT RATIO: 19 (ref 11–26)
BUN: 18 mg/dL (ref 8–27)
CHLORIDE: 100 mmol/L (ref 97–108)
CO2: 21 mmol/L (ref 18–29)
CREATININE: 0.96 mg/dL (ref 0.57–1.00)
Calcium: 9.5 mg/dL (ref 8.7–10.3)
GFR calc non Af Amer: 63 mL/min/{1.73_m2} (ref >59)
GFR, EST AFRICAN AMERICAN: 72 mL/min/{1.73_m2} (ref >59)
GLOBULIN, TOTAL: 2.2 g/dL (ref 1.5–4.5)
GLUCOSE: 168 mg/dL — AB (ref 65–99)
Potassium: 4.4 mmol/L (ref 3.5–5.2)
Sodium: 140 mmol/L (ref 134–144)
TOTAL PROTEIN: 6.3 g/dL (ref 6.0–8.5)

## 2015-05-22 LAB — URIC ACID: Uric Acid: 4.9 mg/dL (ref 2.5–7.1)

## 2015-05-22 MED ORDER — FLUCONAZOLE 150 MG PO TABS: 150.0000 mg | ORAL_TABLET | Freq: Once | ORAL | Status: DC

## 2015-05-22 NOTE — Telephone Encounter (Signed)
PT CALLED IN AND WOULD LIKE TO HAVE A PILL SENT TO [facility] FOR A YEAST INFECTION.

## 2015-05-22 NOTE — Telephone Encounter (Signed)
Called and let patient know that [facility] sent something for her to the pharmacy.

## 2015-05-22 NOTE — Telephone Encounter (Signed)
No, but it is OK.

## 2015-05-22 NOTE — Telephone Encounter (Signed)
Patient was just seen yesterday. I dont see anything in the note about a yeast infection. Did she mention anything to you about this?

## 2015-06-26 ENCOUNTER — Other Ambulatory Visit: Payer: Self-pay | Admitting: Neurology

## 2015-06-26 DIAGNOSIS — R51 Headache: Secondary | ICD-10-CM

## 2015-06-26 DIAGNOSIS — R42 Dizziness and giddiness: Secondary | ICD-10-CM

## 2015-06-26 DIAGNOSIS — R519 Headache, unspecified: Secondary | ICD-10-CM

## 2015-06-30 ENCOUNTER — Ambulatory Visit: Payer: BLUE CROSS/BLUE SHIELD | Admitting: Unknown Physician Specialty

## 2015-07-02 ENCOUNTER — Ambulatory Visit (INDEPENDENT_AMBULATORY_CARE_PROVIDER_SITE_OTHER): Payer: BLUE CROSS/BLUE SHIELD | Admitting: Unknown Physician Specialty

## 2015-07-02 ENCOUNTER — Encounter: Payer: Self-pay | Admitting: Unknown Physician Specialty

## 2015-07-02 VITALS — BP 137/75 | HR 89 | Temp 98.2°F | Ht 65.8 in | Wt 176.4 lb

## 2015-07-02 DIAGNOSIS — E1122 Type 2 diabetes mellitus with diabetic chronic kidney disease: Secondary | ICD-10-CM | POA: Diagnosis not present

## 2015-07-02 DIAGNOSIS — N189 Chronic kidney disease, unspecified: Secondary | ICD-10-CM

## 2015-07-02 DIAGNOSIS — N183 Chronic kidney disease, stage 3 (moderate): Secondary | ICD-10-CM | POA: Diagnosis not present

## 2015-07-02 DIAGNOSIS — I129 Hypertensive chronic kidney disease with stage 1 through stage 4 chronic kidney disease, or unspecified chronic kidney disease: Secondary | ICD-10-CM

## 2015-07-02 DIAGNOSIS — IMO0002 Reserved for concepts with insufficient information to code with codable children: Secondary | ICD-10-CM

## 2015-07-02 DIAGNOSIS — N182 Chronic kidney disease, stage 2 (mild): Secondary | ICD-10-CM | POA: Diagnosis not present

## 2015-07-02 DIAGNOSIS — G4700 Insomnia, unspecified: Secondary | ICD-10-CM | POA: Diagnosis not present

## 2015-07-02 DIAGNOSIS — N184 Chronic kidney disease, stage 4 (severe): Secondary | ICD-10-CM

## 2015-07-02 DIAGNOSIS — N181 Chronic kidney disease, stage 1: Secondary | ICD-10-CM

## 2015-07-02 DIAGNOSIS — N185 Chronic kidney disease, stage 5: Secondary | ICD-10-CM | POA: Diagnosis not present

## 2015-07-02 DIAGNOSIS — E1165 Type 2 diabetes mellitus with hyperglycemia: Principal | ICD-10-CM

## 2015-07-02 MED ORDER — METFORMIN HCL ER 500 MG PO TB24: 2000.0000 mg | ORAL_TABLET | Freq: Every day | ORAL | Status: DC

## 2015-07-02 MED ORDER — RAMIPRIL 2.5 MG PO CAPS: 2.5000 mg | ORAL_CAPSULE | Freq: Every day | ORAL | Status: DC

## 2015-07-02 NOTE — Progress Notes (Addendum)
BP 137/75 mmHg  Pulse 89  Temp(Src) 98.2 F (36.8 C)  Ht 5' 5.8" (1.671 m)  Wt 176 lb 6.4 oz (80.015 kg)  BMI 28.66 kg/m2  SpO2 95%  LMP  (LMP Unknown)   Subjective:    Patient ID: Jessica Montoya, female    DOB: 04-22-1951, 64 y.o.   MRN: [id number]  HPI: Jessica Montoya is a 64 y.o. female  Chief Complaint  Patient presents with  . Hyperglycemia    pt states she is here because her blood sugar has been high. States she has been checking at home, runs higher in the mornings. States she is also seeing Dr. Manuella Ghazi   Diabetes Pt is not currently seeing her endocrinologist as she missed her appointment.  She is planning on rescheduling and takes 4-6 weeks to see her.  Her blood sugars are 190 this AM.  The highest is 246 while the lowest is 118.  She is taking 60 u Lantus QHS with Apidra on a sliding scale but 15u if not checking her blood sugar at that time.  She checks her BS twice a day. Last Hgb A1C is 9.5.  Dr. Brigitte Pulse ordered additional tests and were reviewed.  She is also taking Metformin  Dizzyness W/u started with Dr. Brigitte Pulse who feels symptoms may be related to neuropathy.    Insomnia She has tapered down her sleeping pills and now she "doesn't sleep."  She states she goes to bed 10-8.  She is not sleeping during the day.  She is walking for exercise.  She states she went to the web sites I recommended previously but has not committed to a program.    Relevant past medical, surgical, family and social history reviewed and updated as indicated. Interim medical history since our last visit reviewed. Allergies and medications reviewed and updated.  Review of Systems  Per HPI unless specifically indicated above     Objective:    BP 137/75 mmHg  Pulse 89  Temp(Src) 98.2 F (36.8 C)  Ht 5' 5.8" (1.671 m)  Wt 176 lb 6.4 oz (80.015 kg)  BMI 28.66 kg/m2  SpO2 95%  LMP  (LMP Unknown)  Wt Readings from Last 3 Encounters:  07/02/15 176 lb 6.4 oz (80.015 kg)  05/21/15  176 lb 9.6 oz (80.105 kg)  09/11/14 180 lb (81.647 kg)    Physical Exam  Constitutional: She is oriented to person, place, and time. She appears well-developed and well-nourished. No distress.  HENT:  Head: Normocephalic and atraumatic.  Eyes: Conjunctivae and lids are normal. Right eye exhibits no discharge. Left eye exhibits no discharge. No scleral icterus.  Cardiovascular: Normal rate, regular rhythm and normal heart sounds.   Pulmonary/Chest: Effort normal and breath sounds normal. No respiratory distress.  Abdominal: Normal appearance. There is no splenomegaly or hepatomegaly.  Musculoskeletal: Normal range of motion.  Neurological: She is alert and oriented to person, place, and time.  Skin: Skin is intact. No rash noted. No pallor.  Psychiatric: She has a normal mood and affect. Her behavior is normal. Judgment and thought content normal.    Results for orders placed or performed in visit on 05/21/15  Bayer DCA Hb A1c Waived  Result Value Ref Range   Bayer DCA Hb A1c Waived 9.5 (H) <7.0 %  Lipid Panel Piccolo, Waived  Result Value Ref Range   Cholesterol Piccolo, Waived 125 <200 mg/dL   HDL Chol Piccolo, Waived 47 (L) >59 mg/dL   Triglycerides Piccolo,Waived 225 (H) <  150 mg/dL   Chol/HDL Ratio Piccolo,Waive 2.6 mg/dL   LDL Chol Calc Piccolo Waived 33 <100 mg/dL   VLDL Chol Calc Piccolo,Waive 45 (H) <30 mg/dL  Uric acid  Result Value Ref Range   Uric Acid 4.9 2.5 - 7.1 mg/dL  Comprehensive metabolic panel  Result Value Ref Range   Glucose 168 (H) 65 - 99 mg/dL   BUN 18 8 - 27 mg/dL   Creatinine, Ser 0.96 0.57 - 1.00 mg/dL   GFR calc non Af Amer 63 >59 mL/min/1.73   GFR calc Af Amer 72 >59 mL/min/1.73   BUN/Creatinine Ratio 19 11 - 26   Sodium 140 134 - 144 mmol/L   Potassium 4.4 3.5 - 5.2 mmol/L   Chloride 100 97 - 108 mmol/L   CO2 21 18 - 29 mmol/L   Calcium 9.5 8.7 - 10.3 mg/dL   Total Protein 6.3 6.0 - 8.5 g/dL   Albumin 4.1 3.6 - 4.8 g/dL   Globulin, Total  2.2 1.5 - 4.5 g/dL   Albumin/Globulin Ratio 1.9 1.1 - 2.5   Bilirubin Total 0.4 0.0 - 1.2 mg/dL   Alkaline Phosphatase 59 39 - 117 IU/L   AST 14 0 - 40 IU/L   ALT 15 0 - 32 IU/L      Assessment & Plan:   Problem List Items Addressed This Visit      Unprioritized   Hypertensive CKD (chronic kidney disease)    Stable, continue present medications.       Uncontrolled type 2 diabetes mellitus with chronic kidney disease - Primary    Again asked pt to make an appointment with her endocrinologist who needs to f/u on her blood sugar.  This was recommended last visit as well.  Needs Metformin until she can see her specialist.        Relevant Medications   ramipril (ALTACE) 2.5 MG capsule   metFORMIN (GLUCOPHAGE-XR) 500 MG 24 hr tablet   Insomnia    Tapered off Ambien.  Not sleeping at all.  Encouraged to do CBT training through on-line resources          Follow up plan: Return in about 5 months (around 12/02/2015) for Welcome to Medicare exam.

## 2015-07-02 NOTE — Assessment & Plan Note (Signed)
Tapered off Ambien.  Not sleeping at all.  Encouraged to do CBT training through on-line resources

## 2015-07-02 NOTE — Assessment & Plan Note (Signed)
Stable, continue present medications.   

## 2015-07-02 NOTE — Assessment & Plan Note (Addendum)
Again asked pt to make an appointment with her endocrinologist who needs to f/u on her blood sugar.  This was recommended last visit as well.  Needs Metformin until she can see her specialist.

## 2015-07-03 ENCOUNTER — Ambulatory Visit: Payer: BLUE CROSS/BLUE SHIELD

## 2015-07-18 ENCOUNTER — Ambulatory Visit
Admission: RE | Admit: 2015-07-18 | Discharge: 2015-07-18 | Disposition: A | Discharge status: Home / Self Care | Payer: Medicare Other | Source: Ambulatory Visit | Attending: Neurology | Admitting: Neurology

## 2015-07-18 DIAGNOSIS — Z85038 Personal history of other malignant neoplasm of large intestine: Secondary | ICD-10-CM | POA: Insufficient documentation

## 2015-07-18 DIAGNOSIS — R51 Headache: Secondary | ICD-10-CM | POA: Insufficient documentation

## 2015-07-18 DIAGNOSIS — R42 Dizziness and giddiness: Secondary | ICD-10-CM | POA: Diagnosis present

## 2015-07-18 DIAGNOSIS — R296 Repeated falls: Secondary | ICD-10-CM | POA: Diagnosis not present

## 2015-07-18 DIAGNOSIS — R519 Headache, unspecified: Secondary | ICD-10-CM

## 2015-07-18 MED ORDER — GADOBENATE DIMEGLUMINE 529 MG/ML IV SOLN
20.0000 mL | Freq: Once | INTRAVENOUS | Status: AC | PRN
  Administered 2015-07-18: 16 mL via INTRAVENOUS

## 2015-08-06 ENCOUNTER — Encounter: Payer: Self-pay | Admitting: Unknown Physician Specialty

## 2015-08-06 ENCOUNTER — Ambulatory Visit (INDEPENDENT_AMBULATORY_CARE_PROVIDER_SITE_OTHER): Payer: Medicare Other | Admitting: Unknown Physician Specialty

## 2015-08-06 VITALS — BP 106/73 | HR 81 | Temp 98.1°F | Ht 65.7 in | Wt 175.4 lb

## 2015-08-06 DIAGNOSIS — Z794 Long term (current) use of insulin: Secondary | ICD-10-CM

## 2015-08-06 DIAGNOSIS — Z0000 Encounter for general adult medical examination without abnormal findings: Secondary | ICD-10-CM | POA: Diagnosis not present

## 2015-08-06 DIAGNOSIS — F332 Major depressive disorder, recurrent severe without psychotic features: Secondary | ICD-10-CM

## 2015-08-06 DIAGNOSIS — E785 Hyperlipidemia, unspecified: Secondary | ICD-10-CM | POA: Diagnosis not present

## 2015-08-06 DIAGNOSIS — E1165 Type 2 diabetes mellitus with hyperglycemia: Secondary | ICD-10-CM | POA: Diagnosis not present

## 2015-08-06 DIAGNOSIS — G6289 Other specified polyneuropathies: Secondary | ICD-10-CM | POA: Diagnosis not present

## 2015-08-06 DIAGNOSIS — E1122 Type 2 diabetes mellitus with diabetic chronic kidney disease: Secondary | ICD-10-CM | POA: Diagnosis not present

## 2015-08-06 DIAGNOSIS — I10 Essential (primary) hypertension: Secondary | ICD-10-CM

## 2015-08-06 DIAGNOSIS — Z23 Encounter for immunization: Secondary | ICD-10-CM

## 2015-08-06 LAB — MICROALBUMIN, URINE WAIVED
Creatinine, Urine Waived: 200 mg/dL (ref 10–300)
Microalb, Ur Waived: 80 mg/L — ABNORMAL HIGH (ref 0–19)

## 2015-08-06 LAB — BAYER DCA HB A1C WAIVED: HB A1C (BAYER DCA - WAIVED): 7.4 % — ABNORMAL HIGH (ref <7.0)

## 2015-08-06 NOTE — Progress Notes (Signed)
BP 106/73 mmHg  Pulse 81  Temp(Src) 98.1 F (36.7 C)  Ht 5' 5.7" (1.669 m)  Wt 175 lb 6.4 oz (79.561 kg)  BMI 28.56 kg/m2  SpO2 98%  LMP  (LMP Unknown)   Subjective:    Patient ID: Jessica Montoya, female    DOB: 1951/09/15, 64 y.o.   MRN: [id number]  HPI: Jessica Montoya is a 64 y.o. female  Chief Complaint  Patient presents with  . Medicare Wellness   Diabetes through Endocrine  Hypertension  Using medications without difficulty Average home BPs have been running high at times   Using medication without problems or lightheadedness.  She has had persistent vertigo evaluated by ENT and Dr. Brigitte Pulse.   No chest pain with exertion or shortness of breath Edema: no  Elevated Cholesterol: Using medications without problems Muscle aches: no Diet compliance:no Exercise:no  Depression screen PHQ 2/9 08/06/2015  Decreased Interest 0  Down, Depressed, Hopeless 0  PHQ - 2 Score 0    Fall Risk  08/06/2015  Falls in the past year? Yes  Number falls in past yr: 2 or more  Injury with Fall? No   Relevant past medical, surgical, family and social history reviewed and updated as indicated. Interim medical history since our last visit reviewed. Allergies and medications reviewed and updated.  See functional status, depression screen, and fall's risk assessment  under the appropriate section.    Pt is able to perform complex mental tasks, recognize clock face, recognize time and do a 3 item recall.      Relevant past medical, surgical, family and social history reviewed and updated as indicated. Interim medical history since our last visit reviewed. Allergies and medications reviewed and updated.  Review of Systems  Per HPI unless specifically indicated above     Objective:    BP 106/73 mmHg  Pulse 81  Temp(Src) 98.1 F (36.7 C)  Ht 5' 5.7" (1.669 m)  Wt 175 lb 6.4 oz (79.561 kg)  BMI 28.56 kg/m2  SpO2 98%  LMP  (LMP Unknown)  Wt Readings from Last 3  Encounters:  08/06/15 175 lb 6.4 oz (79.561 kg)  07/02/15 176 lb 6.4 oz (80.015 kg)  05/21/15 176 lb 9.6 oz (80.105 kg)    Eye exam done  Physical Exam  Constitutional: She is oriented to person, place, and time. She appears well-developed and well-nourished.  HENT:  Head: Normocephalic and atraumatic.  Eyes: Pupils are equal, round, and reactive to light. Right eye exhibits no discharge. Left eye exhibits no discharge. No scleral icterus.  Neck: Normal range of motion. Neck supple. Carotid bruit is not present. No thyromegaly present.  Cardiovascular: Normal rate, regular rhythm and normal heart sounds.  Exam reveals no gallop and no friction rub.   No murmur heard. Pulmonary/Chest: Effort normal and breath sounds normal. No respiratory distress. She has no wheezes. She has no rales.  Abdominal: Soft. Bowel sounds are normal. There is no tenderness. There is no rebound.  Genitourinary: No breast swelling, tenderness or discharge.  Musculoskeletal: Normal range of motion.  Lymphadenopathy:    She has no cervical adenopathy.  Neurological: She is alert and oriented to person, place, and time.  Skin: Skin is warm, dry and intact. No rash noted.  Psychiatric: She has a normal mood and affect. Her speech is normal and behavior is normal. Judgment and thought content normal. Cognition and memory are normal.  Vitals reviewed.  Diabetic Foot Exam - Simple   Simple Foot  Form  Visual Inspection  No deformities, no ulcerations, no other skin breakdown bilaterally:  Yes  Sensation Testing  See comments:  Yes  Pulse Check  Posterior Tibialis and Dorsalis pulse intact bilaterally:  Yes  Comments  Decreased bilaterally     EKG without acute changes.  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   -   Assessment & Plan:   Problem List Items Addressed This Visit      Unprioritized   Peripheral neuropathy ([place])   Hypertension    Stable, continue present  medications.       Relevant Orders   EKG 12-Lead (Completed)   Comprehensive metabolic panel   Microalbumin, Urine Waived   Uric acid   Hyperlipidemia   Relevant Orders   Lipid Panel w/o Chol/HDL Ratio   Long term current use of insulin ([place])   Uncontrolled type 2 diabetes mellitus with chronic kidney disease (HCC)   Relevant Orders   Microalbumin, Urine Waived   CBC with Differential/Platelet   Bayer DCA Hb A1c Waived    Other Visit Diagnoses    Routine general medical examination at a health care facility    -  Primary    Relevant Orders    EKG 12-Lead (Completed)    HIV antibody    Hepatitis C antibody    CBC with Differential/Platelet    TSH    Pneumococcal conjugate vaccine 13-valent IM (Completed)    Severe episode of recurrent major depressive disorder, without psychotic features ([place])           Diagnosis stable.  Continue with present meds.  Hgb A1C is 7.4 and will bring number to endocrine.  Await Lipid panel  Follow up plan: Return in about 6 months (around 02/03/2016).

## 2015-08-06 NOTE — Assessment & Plan Note (Signed)
Stable, continue present medications.   

## 2015-08-07 ENCOUNTER — Encounter: Payer: Self-pay | Admitting: Unknown Physician Specialty

## 2015-08-07 LAB — COMPREHENSIVE METABOLIC PANEL
ALBUMIN: 4.4 g/dL (ref 3.6–4.8)
ALK PHOS: 67 IU/L (ref 39–117)
ALT: 29 IU/L (ref 0–32)
AST: 30 IU/L (ref 0–40)
Albumin/Globulin Ratio: 1.8 (ref 1.1–2.5)
BILIRUBIN TOTAL: 0.4 mg/dL (ref 0.0–1.2)
BUN / CREAT RATIO: 16 (ref 11–26)
BUN: 16 mg/dL (ref 8–27)
CHLORIDE: 101 mmol/L (ref 97–106)
CO2: 20 mmol/L (ref 18–29)
Calcium: 9.5 mg/dL (ref 8.7–10.3)
Creatinine, Ser: 0.97 mg/dL (ref 0.57–1.00)
GFR calc Af Amer: 71 mL/min/{1.73_m2} (ref >59)
GFR calc non Af Amer: 61 mL/min/{1.73_m2} (ref >59)
GLOBULIN, TOTAL: 2.5 g/dL (ref 1.5–4.5)
GLUCOSE: 64 mg/dL — AB (ref 65–99)
Potassium: 4.6 mmol/L (ref 3.5–5.2)
SODIUM: 140 mmol/L (ref 136–144)
Total Protein: 6.9 g/dL (ref 6.0–8.5)

## 2015-08-07 LAB — LIPID PANEL W/O CHOL/HDL RATIO
CHOLESTEROL TOTAL: 161 mg/dL (ref 100–199)
HDL: 53 mg/dL (ref >39)
LDL Calculated: 55 mg/dL (ref 0–99)
Triglycerides: 267 mg/dL — ABNORMAL HIGH (ref 0–149)
VLDL Cholesterol Cal: 53 mg/dL — ABNORMAL HIGH (ref 5–40)

## 2015-08-07 LAB — CBC WITH DIFFERENTIAL/PLATELET
BASOS: 0 %
Basophils Absolute: 0 10*3/uL (ref 0.0–0.2)
EOS (ABSOLUTE): 0.2 10*3/uL (ref 0.0–0.4)
EOS: 3 %
HEMATOCRIT: 37.7 % (ref 34.0–46.6)
HEMOGLOBIN: 12.3 g/dL (ref 11.1–15.9)
IMMATURE GRANS (ABS): 0 10*3/uL (ref 0.0–0.1)
IMMATURE GRANULOCYTES: 1 %
LYMPHS: 24 %
Lymphocytes Absolute: 1.5 10*3/uL (ref 0.7–3.1)
MCH: 29.5 pg (ref 26.6–33.0)
MCHC: 32.6 g/dL (ref 31.5–35.7)
MCV: 90 fL (ref 79–97)
MONOCYTES: 6 %
Monocytes Absolute: 0.4 10*3/uL (ref 0.1–0.9)
NEUTROS PCT: 66 %
Neutrophils Absolute: 4.2 10*3/uL (ref 1.4–7.0)
PLATELETS: 218 10*3/uL (ref 150–379)
RBC: 4.17 x10E6/uL (ref 3.77–5.28)
RDW: 15.9 % — ABNORMAL HIGH (ref 12.3–15.4)
WBC: 6.3 10*3/uL (ref 3.4–10.8)

## 2015-08-07 LAB — URIC ACID: Uric Acid: 6.1 mg/dL (ref 2.5–7.1)

## 2015-08-07 LAB — HIV ANTIBODY (ROUTINE TESTING W REFLEX): HIV SCREEN 4TH GENERATION: NONREACTIVE

## 2015-08-07 LAB — TSH: TSH: 2.16 u[IU]/mL (ref 0.450–4.500)

## 2015-08-07 LAB — HEPATITIS C ANTIBODY: Hep C Virus Ab: <0.1 s/co ratio (ref 0.0–0.9)

## 2015-10-23 ENCOUNTER — Other Ambulatory Visit: Payer: Self-pay

## 2015-10-23 MED ORDER — GABAPENTIN 300 MG PO CAPS: 300.0000 mg | ORAL_CAPSULE | Freq: Every day | ORAL | Status: DC

## 2015-10-23 NOTE — Telephone Encounter (Signed)
Patient was seen 08/06/15 and has appointment 02/04/16. Pharmacy is [facility] and they are requesting a 90 day supply.

## 2015-11-27 ENCOUNTER — Other Ambulatory Visit: Payer: Self-pay | Admitting: Unknown Physician Specialty

## 2016-01-07 ENCOUNTER — Telehealth: Payer: Self-pay | Admitting: Unknown Physician Specialty

## 2016-01-07 MED ORDER — FLUCONAZOLE 150 MG PO TABS: 150.0000 mg | ORAL_TABLET | Freq: Once | ORAL | Status: DC

## 2016-01-07 NOTE — Telephone Encounter (Signed)
sent 

## 2016-01-07 NOTE — Telephone Encounter (Signed)
Called and spoke to patient. She states she is having some burning and itching in vaginal area. Pharmacy is [facility].

## 2016-01-07 NOTE — Telephone Encounter (Signed)
Pt needs something for a yeast infection sent to [facility]

## 2016-01-12 ENCOUNTER — Other Ambulatory Visit: Payer: Self-pay | Admitting: Unknown Physician Specialty

## 2016-01-16 ENCOUNTER — Other Ambulatory Visit: Payer: Self-pay

## 2016-01-16 MED ORDER — SIMVASTATIN 40 MG PO TABS: 40.0000 mg | ORAL_TABLET | Freq: Every day | ORAL | Status: DC

## 2016-01-16 NOTE — Telephone Encounter (Signed)
Patient was last seen in November and has f/u scheduled 02/04/16. Pharmacy is [facility].

## 2016-01-26 ENCOUNTER — Other Ambulatory Visit: Payer: Self-pay

## 2016-01-26 MED ORDER — SIMVASTATIN 40 MG PO TABS: 40.0000 mg | ORAL_TABLET | Freq: Every day | ORAL | Status: DC

## 2016-01-26 MED ORDER — RAMIPRIL 2.5 MG PO CAPS: 2.5000 mg | ORAL_CAPSULE | Freq: Every day | ORAL | Status: DC

## 2016-01-26 NOTE — Telephone Encounter (Signed)
We refilled these medications earlier in the month but pharmacy sent a fax requesting 90 day supplies. Pharmacy is [facility].

## 2016-02-04 ENCOUNTER — Ambulatory Visit (INDEPENDENT_AMBULATORY_CARE_PROVIDER_SITE_OTHER): Payer: Medicare Other | Admitting: Unknown Physician Specialty

## 2016-02-04 ENCOUNTER — Encounter: Payer: Self-pay | Admitting: Unknown Physician Specialty

## 2016-02-04 VITALS — BP 112/70 | HR 108 | Temp 98.2°F | Ht 65.6 in | Wt 169.4 lb

## 2016-02-04 DIAGNOSIS — Z794 Long term (current) use of insulin: Secondary | ICD-10-CM

## 2016-02-04 DIAGNOSIS — E1165 Type 2 diabetes mellitus with hyperglycemia: Secondary | ICD-10-CM

## 2016-02-04 DIAGNOSIS — I10 Essential (primary) hypertension: Secondary | ICD-10-CM

## 2016-02-04 DIAGNOSIS — E785 Hyperlipidemia, unspecified: Secondary | ICD-10-CM | POA: Diagnosis not present

## 2016-02-04 DIAGNOSIS — E1122 Type 2 diabetes mellitus with diabetic chronic kidney disease: Secondary | ICD-10-CM | POA: Diagnosis not present

## 2016-02-04 DIAGNOSIS — K529 Noninfective gastroenteritis and colitis, unspecified: Secondary | ICD-10-CM | POA: Diagnosis not present

## 2016-02-04 LAB — BAYER DCA HB A1C WAIVED: HB A1C: 8.7 % — AB (ref <7.0)

## 2016-02-04 MED ORDER — GABAPENTIN 300 MG PO CAPS: 300.0000 mg | ORAL_CAPSULE | Freq: Every day | ORAL | Status: DC

## 2016-02-04 MED ORDER — SIMVASTATIN 40 MG PO TABS: 40.0000 mg | ORAL_TABLET | Freq: Every day | ORAL | Status: DC

## 2016-02-04 MED ORDER — RAMIPRIL 2.5 MG PO CAPS: 2.5000 mg | ORAL_CAPSULE | Freq: Every day | ORAL | Status: DC

## 2016-02-04 MED ORDER — OMEPRAZOLE 20 MG PO CPDR: 20.0000 mg | DELAYED_RELEASE_CAPSULE | Freq: Every day | ORAL | Status: DC

## 2016-02-04 MED ORDER — FLUOXETINE HCL 20 MG PO CAPS: 20.0000 mg | ORAL_CAPSULE | Freq: Every day | ORAL | Status: DC

## 2016-02-04 MED ORDER — METFORMIN HCL ER 500 MG PO TB24: 2000.0000 mg | ORAL_TABLET | Freq: Every day | ORAL | Status: DC

## 2016-02-04 NOTE — Assessment & Plan Note (Signed)
Stable, continue present medications.   

## 2016-02-04 NOTE — Patient Instructions (Signed)
Take Align.  It's a OTC probiotic  Take Metamucil in the AM.  Stop artificial sweeteners.  Consider dcing Metformin

## 2016-02-04 NOTE — Assessment & Plan Note (Signed)
Unclear if IBS vs physiologic.  Check for celiac.  Take Align.  Take Metamucil in the AM.  Stop artificial sweeteners.  Consider dcing Metformin

## 2016-02-04 NOTE — Progress Notes (Signed)
BP 112/70 mmHg  Pulse 108  Temp(Src) 98.2 F (36.8 C)  Ht 5' 5.6" (1.666 m)  Wt 169 lb 6.4 oz (76.839 kg)  BMI 27.68 kg/m2  SpO2 97%  LMP  (LMP Unknown)   Subjective:    Patient ID: Jessica Montoya, female    DOB: 1951/03/25, 65 y.o.   MRN: [id number]  HPI: Jessica Montoya is a 65 y.o. female  Chief Complaint  Patient presents with  . Diabetes  . Hyperlipidemia  . Hypertension  . Medication Refill    pt states she needs refills on simvastatin  . Diarrhea    pt states she has been struggling with diarrhea for the last 4 years every since her colon surgery   Diabetes:  Through Dr. [name].   Last hgb A1C was around 7.4 6 months ago.  I need to check here for Dr. [name] next week.    Hypertension:  Dizzyness better Using medications without difficulty Average home BPs  Not sure what they are but checks Using medication without problems or lightheadedness No chest pain with exertion or shortness of breath No Edema  Elevated Cholesterol: Using medications without problems No Muscle aches Diet compliance:limits sugar Exercise:Dirrhea is getting in the way  Diarrhea This has been going on for about 4 years.  It seems to be Ok at the beginning of the day.  Describes.  Frequency comes and goes but once about 18 times.  She needs a diarrhea pill daily.  Has bloating and pain.  She is followed for colon cancer and right colon removed.  Never tried going off Metformin.  She has tried a gluten free and no change  Relevant past medical, surgical, family and social history reviewed and updated as indicated. Interim medical history since our last visit reviewed. Allergies and medications reviewed and updated.  Review of Systems  Per HPI unless specifically indicated above     Objective:    BP 112/70 mmHg  Pulse 108  Temp(Src) 98.2 F (36.8 C)  Ht 5' 5.6" (1.666 m)  Wt 169 lb 6.4 oz (76.839 kg)  BMI 27.68 kg/m2  SpO2 97%  LMP  (LMP Unknown)  Wt Readings from  Last 3 Encounters:  02/04/16 169 lb 6.4 oz (76.839 kg)  08/06/15 175 lb 6.4 oz (79.561 kg)  07/02/15 176 lb 6.4 oz (80.015 kg)    Physical Exam  Constitutional: She is oriented to person, place, and time. She appears well-developed and well-nourished. No distress.  HENT:  Head: Normocephalic and atraumatic.  Eyes: Conjunctivae and lids are normal. Right eye exhibits no discharge. Left eye exhibits no discharge. No scleral icterus.  Neck: Normal range of motion. Neck supple. No JVD present. Carotid bruit is not present.  Cardiovascular: Normal rate, regular rhythm and normal heart sounds.   Pulmonary/Chest: Effort normal and breath sounds normal.  Abdominal: Soft. Normal appearance. She exhibits no distension. There is no splenomegaly or hepatomegaly. There is no tenderness.  Musculoskeletal: Normal range of motion.  Neurological: She is alert and oriented to person, place, and time.  Skin: Skin is warm, dry and intact. No rash noted. No pallor.  Psychiatric: She has a normal mood and affect. Her behavior is normal. Judgment and thought content normal.     Assessment & Plan:   Problem List Items Addressed This Visit      Unprioritized   Chronic diarrhea    Unclear if IBS vs physiologic.  Check for celiac.  Take Align.  Take Metamucil in  the AM.  Stop artificial sweeteners.  Consider dcing Metformin      Relevant Orders   Celiac Ab tTG DGP TIgA   CBC with Differential/Platelet   Hyperlipidemia    Check lipid panel      Relevant Medications   ramipril (ALTACE) 2.5 MG capsule   simvastatin (ZOCOR) 40 MG tablet   Other Relevant Orders   Lipid Panel w/o Chol/HDL Ratio   Hypertension - Primary    Stable, continue present medications.        Relevant Medications   ramipril (ALTACE) 2.5 MG capsule   simvastatin (ZOCOR) 40 MG tablet   Other Relevant Orders   Comprehensive metabolic panel   Uncontrolled type 2 diabetes mellitus with chronic kidney disease (HCC)    Check Hgb  A1C for Dr. [name]      Relevant Medications   ramipril (ALTACE) 2.5 MG capsule   simvastatin (ZOCOR) 40 MG tablet   metFORMIN (GLUCOPHAGE-XR) 500 MG 24 hr tablet   Other Relevant Orders   Bayer DCA Hb A1c Waived       Follow up plan: Return in about 6 months (around 08/06/2016).

## 2016-02-04 NOTE — Assessment & Plan Note (Signed)
Check lipid panel  

## 2016-02-04 NOTE — Assessment & Plan Note (Signed)
Check Hgb A1C for Dr. [name]

## 2016-02-06 ENCOUNTER — Encounter: Payer: Self-pay | Admitting: Unknown Physician Specialty

## 2016-02-06 ENCOUNTER — Telehealth: Payer: Self-pay | Admitting: Unknown Physician Specialty

## 2016-02-06 NOTE — Telephone Encounter (Signed)
Talked to pt about low GFR.  Recheck in 6 months.  Avoid NSAIDS.  Improve glycemic control.

## 2016-02-09 LAB — CBC WITH DIFFERENTIAL/PLATELET
BASOS: 0 %
Basophils Absolute: 0 10*3/uL (ref 0.0–0.2)
EOS (ABSOLUTE): 0.1 10*3/uL (ref 0.0–0.4)
Eos: 2 %
HEMATOCRIT: 36.9 % (ref 34.0–46.6)
HEMOGLOBIN: 11.9 g/dL (ref 11.1–15.9)
Immature Grans (Abs): 0 10*3/uL (ref 0.0–0.1)
Immature Granulocytes: 0 %
LYMPHS ABS: 1.3 10*3/uL (ref 0.7–3.1)
Lymphs: 22 %
MCH: 30.2 pg (ref 26.6–33.0)
MCHC: 32.2 g/dL (ref 31.5–35.7)
MCV: 94 fL (ref 79–97)
MONOCYTES: 5 %
Monocytes Absolute: 0.3 10*3/uL (ref 0.1–0.9)
NEUTROS ABS: 4.2 10*3/uL (ref 1.4–7.0)
Neutrophils: 71 %
Platelets: 211 10*3/uL (ref 150–379)
RBC: 3.94 x10E6/uL (ref 3.77–5.28)
RDW: 14.1 % (ref 12.3–15.4)
WBC: 5.9 10*3/uL (ref 3.4–10.8)

## 2016-02-09 LAB — LIPID PANEL W/O CHOL/HDL RATIO
Cholesterol, Total: 138 mg/dL (ref 100–199)
HDL: 48 mg/dL (ref >39)
LDL CALC: 33 mg/dL (ref 0–99)
TRIGLYCERIDES: 283 mg/dL — AB (ref 0–149)
VLDL Cholesterol Cal: 57 mg/dL — ABNORMAL HIGH (ref 5–40)

## 2016-02-09 LAB — COMPREHENSIVE METABOLIC PANEL
A/G RATIO: 1.6 (ref 1.2–2.2)
ALBUMIN: 4.3 g/dL (ref 3.6–4.8)
ALT: 25 IU/L (ref 0–32)
AST: 21 IU/L (ref 0–40)
Alkaline Phosphatase: 60 IU/L (ref 39–117)
BILIRUBIN TOTAL: 0.4 mg/dL (ref 0.0–1.2)
BUN / CREAT RATIO: 19 (ref 12–28)
BUN: 22 mg/dL (ref 8–27)
CHLORIDE: 100 mmol/L (ref 96–106)
CO2: 19 mmol/L (ref 18–29)
Calcium: 9.2 mg/dL (ref 8.7–10.3)
Creatinine, Ser: 1.15 mg/dL — ABNORMAL HIGH (ref 0.57–1.00)
GFR calc non Af Amer: 50 mL/min/{1.73_m2} — ABNORMAL LOW (ref >59)
GFR, EST AFRICAN AMERICAN: 58 mL/min/{1.73_m2} — AB (ref >59)
Globulin, Total: 2.7 g/dL (ref 1.5–4.5)
Glucose: 184 mg/dL — ABNORMAL HIGH (ref 65–99)
Potassium: 5.4 mmol/L — ABNORMAL HIGH (ref 3.5–5.2)
Sodium: 139 mmol/L (ref 134–144)
TOTAL PROTEIN: 7 g/dL (ref 6.0–8.5)

## 2016-02-09 LAB — CELIAC AB TTG DGP TIGA
ANTIGLIADIN ABS, IGA: 5 U (ref 0–19)
Gliadin IgG: 2 units (ref 0–19)
IGA/IMMUNOGLOBULIN A, SERUM: 241 mg/dL (ref 87–352)
Tissue Transglut Ab: <2 U/mL (ref 0–5)
Transglutaminase IgA: <2 U/mL (ref 0–3)

## 2016-03-08 ENCOUNTER — Telehealth: Payer: Self-pay | Admitting: Unknown Physician Specialty

## 2016-03-08 MED ORDER — FLUCONAZOLE 150 MG PO TABS: 150.0000 mg | ORAL_TABLET | Freq: Once | ORAL | Status: DC

## 2016-03-08 NOTE — Telephone Encounter (Signed)
Routing to provider  

## 2016-03-08 NOTE — Telephone Encounter (Signed)
Pt would like to have something called in to [facility] for a yeast infection.

## 2016-05-07 ENCOUNTER — Telehealth: Payer: Self-pay | Admitting: Unknown Physician Specialty

## 2016-05-07 MED ORDER — FLUCONAZOLE 150 MG PO TABS: 150.0000 mg | ORAL_TABLET | Freq: Once | ORAL | 2 refills | Status: DC

## 2016-05-07 NOTE — Telephone Encounter (Signed)
Routing to provider  

## 2016-05-07 NOTE — Telephone Encounter (Signed)
Last rx seems up to date

## 2016-05-07 NOTE — Telephone Encounter (Signed)
Pt would like fluconazole (DIFLUCAN) 150 MG tablet to [facility].

## 2016-07-08 ENCOUNTER — Telehealth: Payer: Self-pay | Admitting: Unknown Physician Specialty

## 2016-07-08 NOTE — Telephone Encounter (Signed)
Routing to provider  

## 2016-07-09 MED ORDER — FLUCONAZOLE 150 MG PO TABS: 150.0000 mg | ORAL_TABLET | Freq: Once | ORAL | 0 refills | Status: AC

## 2016-07-09 NOTE — Telephone Encounter (Signed)
yes

## 2016-08-05 ENCOUNTER — Telehealth: Payer: Self-pay | Admitting: Unknown Physician Specialty

## 2016-08-05 NOTE — Telephone Encounter (Signed)
Pt called to cancel her follow up appt she did not wish to reschedule at this time.

## 2016-08-06 ENCOUNTER — Ambulatory Visit: Payer: Medicare Other | Admitting: Unknown Physician Specialty

## 2016-11-18 ENCOUNTER — Telehealth: Payer: Self-pay | Admitting: Unknown Physician Specialty

## 2016-11-18 NOTE — Telephone Encounter (Signed)
Pt would like something sent to [facility] for a yeast infection.  °

## 2016-11-19 MED ORDER — FLUCONAZOLE 150 MG PO TABS: 150.0000 mg | ORAL_TABLET | Freq: Once | ORAL | 0 refills | Status: AC

## 2016-11-19 NOTE — Telephone Encounter (Signed)
OK, but pt also needs an appointment for f/u

## 2016-11-19 NOTE — Telephone Encounter (Signed)
Routing to provider  

## 2016-11-19 NOTE — Telephone Encounter (Signed)
Called and let patient know that medication was sent in for her. Patient already has appointment scheduled for 11/24/16.

## 2016-11-24 ENCOUNTER — Encounter: Payer: Self-pay | Admitting: Unknown Physician Specialty

## 2016-11-24 ENCOUNTER — Ambulatory Visit (INDEPENDENT_AMBULATORY_CARE_PROVIDER_SITE_OTHER): Payer: Medicare Other | Admitting: Unknown Physician Specialty

## 2016-11-24 VITALS — BP 98/63 | HR 88 | Temp 98.5°F | Ht 66.6 in | Wt 167.6 lb

## 2016-11-24 DIAGNOSIS — Z23 Encounter for immunization: Secondary | ICD-10-CM | POA: Diagnosis not present

## 2016-11-24 DIAGNOSIS — E1122 Type 2 diabetes mellitus with diabetic chronic kidney disease: Secondary | ICD-10-CM | POA: Diagnosis not present

## 2016-11-24 DIAGNOSIS — E782 Mixed hyperlipidemia: Secondary | ICD-10-CM | POA: Diagnosis not present

## 2016-11-24 DIAGNOSIS — Z794 Long term (current) use of insulin: Secondary | ICD-10-CM | POA: Diagnosis not present

## 2016-11-24 DIAGNOSIS — E1165 Type 2 diabetes mellitus with hyperglycemia: Secondary | ICD-10-CM

## 2016-11-24 DIAGNOSIS — F329 Major depressive disorder, single episode, unspecified: Secondary | ICD-10-CM

## 2016-11-24 DIAGNOSIS — G6289 Other specified polyneuropathies: Secondary | ICD-10-CM | POA: Diagnosis not present

## 2016-11-24 DIAGNOSIS — I10 Essential (primary) hypertension: Secondary | ICD-10-CM

## 2016-11-24 DIAGNOSIS — Z1239 Encounter for other screening for malignant neoplasm of breast: Secondary | ICD-10-CM

## 2016-11-24 DIAGNOSIS — Z1231 Encounter for screening mammogram for malignant neoplasm of breast: Secondary | ICD-10-CM

## 2016-11-24 DIAGNOSIS — Z7189 Other specified counseling: Secondary | ICD-10-CM

## 2016-11-24 DIAGNOSIS — F32A Depression, unspecified: Secondary | ICD-10-CM

## 2016-11-24 DIAGNOSIS — Z0000 Encounter for general adult medical examination without abnormal findings: Secondary | ICD-10-CM

## 2016-11-24 MED ORDER — OMEPRAZOLE 20 MG PO CPDR: 20.0000 mg | DELAYED_RELEASE_CAPSULE | Freq: Every day | ORAL | 1 refills | Status: DC

## 2016-11-24 MED ORDER — RAMIPRIL 2.5 MG PO CAPS: 2.5000 mg | ORAL_CAPSULE | Freq: Every day | ORAL | 3 refills | Status: DC

## 2016-11-24 MED ORDER — GABAPENTIN 300 MG PO CAPS: 300.0000 mg | ORAL_CAPSULE | Freq: Two times a day (BID) | ORAL | 1 refills | Status: DC

## 2016-11-24 MED ORDER — FLUOXETINE HCL 20 MG PO CAPS: 20.0000 mg | ORAL_CAPSULE | Freq: Every day | ORAL | 3 refills | Status: DC

## 2016-11-24 MED ORDER — SIMVASTATIN 40 MG PO TABS: 40.0000 mg | ORAL_TABLET | Freq: Every day | ORAL | 3 refills | Status: DC

## 2016-11-24 NOTE — Assessment & Plan Note (Signed)
Per Dr. Solum 

## 2016-11-24 NOTE — Assessment & Plan Note (Signed)
Continues to be a problem.  She monitors her feet

## 2016-11-24 NOTE — Patient Instructions (Signed)

## 2016-11-24 NOTE — Assessment & Plan Note (Signed)
Check labs today.

## 2016-11-24 NOTE — Assessment & Plan Note (Signed)
Stable, continue present medications.   

## 2016-11-24 NOTE — Assessment & Plan Note (Signed)
Pt has not advanced directives but wants to fill one out.  I've given her the information and reviewed the forms and why it's useful.  She wants her husband to be her health care proxy

## 2016-11-24 NOTE — Progress Notes (Signed)
BP 98/63 (BP Location: Left Arm, Patient Position: Sitting, Cuff Size: Large)   Pulse 88   Temp 98.5 F (36.9 C)   Ht 5' 6.6" (1.692 m)   Wt 167 lb 9.6 oz (76 kg)   LMP  (LMP Unknown)   SpO2 100%   BMI 26.57 kg/m    Subjective:    Patient ID: Jessica Montoya, female    DOB: [birth year], 66 y.o.   MRN: [id number]  HPI: Jessica Montoya is a 66 y.o. female  Chief Complaint  Patient presents with  . Diabetes  . Hyperlipidemia  . Hypertension   Functional Status Survey: Is the patient deaf or have difficulty hearing?: Yes (left ear) Does the patient have difficulty seeing, even when wearing glasses/contacts?: No Does the patient have difficulty concentrating, remembering, or making decisions?: No Does the patient have difficulty walking or climbing stairs?: No Does the patient have difficulty dressing or bathing?: No Does the patient have difficulty doing errands alone such as visiting a doctor's office or shopping?: No  Fall Risk  08/06/2015  Falls in the past year? Yes  Number falls in past yr: 2 or more  Injury with Fall? No   Diabetes: Per Endocrine Poorly controlled with last Hgb A1C 11.4.  Notes reviewed  Hypertension  Using medications without difficulty Average home BPs   Using medication without problems or lightheadedness No chest pain with exertion or shortness of breath No Edema  Elevated Cholesterol Using medications without problems No Muscle aches  Diet: Tries to stick to a good diet Exercise: Not great with weather  Depression Doing well.  Still has trouble sleeping Depression screen [facility] 2/9 11/24/2016 08/06/2015  Decreased Interest 1 0  Down, Depressed, Hopeless 0 0  PHQ - 2 Score 1 0    Relevant past medical, surgical, family and social history reviewed and updated as indicated. Interim medical history since our last visit reviewed. Allergies and medications reviewed and updated.  Review of Systems  Constitutional: Positive for  fatigue.  HENT: Negative.   Eyes: Negative.   Respiratory: Negative.   Cardiovascular: Negative.   Gastrointestinal: Negative.   Endocrine: Negative.   Genitourinary: Negative.   Musculoskeletal: Negative.   Skin:       Area she wants me to look at.    Allergic/Immunologic: Negative.   Neurological: Negative.   Hematological: Negative.   Psychiatric/Behavioral: Negative.     Per HPI unless specifically indicated above     Objective:    BP 98/63 (BP Location: Left Arm, Patient Position: Sitting, Cuff Size: Large)   Pulse 88   Temp 98.5 F (36.9 C)   Ht 5' 6.6" (1.692 m)   Wt 167 lb 9.6 oz (76 kg)   LMP  (LMP Unknown)   SpO2 100%   BMI 26.57 kg/m   Wt Readings from Last 3 Encounters:  11/24/16 167 lb 9.6 oz (76 kg)  02/04/16 169 lb 6.4 oz (76.8 kg)  08/06/15 175 lb 6.4 oz (79.6 kg)    Physical Exam  Constitutional: She is oriented to person, place, and time. She appears well-developed and well-nourished.  HENT:  Head: Normocephalic and atraumatic.  Eyes: Pupils are equal, round, and reactive to light. Right eye exhibits no discharge. Left eye exhibits no discharge. No scleral icterus.  Neck: Normal range of motion. Neck supple. Carotid bruit is not present. No thyromegaly present.  Cardiovascular: Normal rate, regular rhythm and normal heart sounds.  Exam reveals no gallop and no friction rub.  No murmur heard. Pulmonary/Chest: Effort normal and breath sounds normal. No respiratory distress. She has no wheezes. She has no rales.  Abdominal: Soft. Bowel sounds are normal. There is no tenderness. There is no rebound.  Genitourinary: No breast swelling, tenderness or discharge.  Musculoskeletal: Normal range of motion.  Lymphadenopathy:    She has no cervical adenopathy.  Neurological: She is alert and oriented to person, place, and time.  Skin: Skin is warm, dry and intact. No rash noted.  Psychiatric: She has a normal mood and affect. Her speech is normal and  behavior is normal. Judgment and thought content normal. Cognition and memory are normal.   Diabetic Foot Exam - Simple   Simple Foot Form Visual Inspection No deformities, no ulcerations, no other skin breakdown bilaterally:  Yes Sensation Testing See comments:  Yes Pulse Check Posterior Tibialis and Dorsalis pulse intact bilaterally:  Yes Comments Numbness bilateral toes     Results for orders placed or performed in visit on 04/23/16  HM DIABETES EYE EXAM  Result Value Ref Range   HM Diabetic Eye Exam No Retinopathy No Retinopathy      Assessment & Plan:   Problem List Items Addressed This Visit      Unprioritized   Advanced care planning/counseling discussion    Pt has not advanced directives but wants to fill one out.  I've given her the information and reviewed the forms and why it's useful.  She wants her husband to be her health care proxy      Depression    Stable, continue present medications.        Hyperlipidemia    Check labs today      Hypertension    Stable, continue present medications.        Relevant Orders   Comprehensive metabolic panel   Lipid Panel w/o Chol/HDL Ratio   Peripheral neuropathy (HCC)    Continues to be a problem.  She monitors her feet      Relevant Medications   gabapentin (NEURONTIN) 300 MG capsule   Uncontrolled type 2 diabetes mellitus with chronic kidney disease ([place])    Per Dr. [name]      Relevant Medications   HUMALOG KWIKPEN 100 UNIT/ML KiwkPen   Other Relevant Orders   Comprehensive metabolic panel   CBC with Differential/Platelet   Lipid Panel w/o Chol/HDL Ratio    Other Visit Diagnoses    Need for pneumococcal vaccination    -  Primary   Relevant Orders   Pneumococcal polysaccharide vaccine 23-valent greater than or equal to 2yo subcutaneous/IM (Completed)   Annual physical exam       Breast cancer screening       Relevant Orders   MM DIGITAL SCREENING BILATERAL       Follow up plan: Return in  about 6 months (around 05/24/2017).

## 2016-11-25 ENCOUNTER — Encounter: Payer: Self-pay | Admitting: Unknown Physician Specialty

## 2016-11-25 LAB — COMPREHENSIVE METABOLIC PANEL
A/G RATIO: 1.5 (ref 1.2–2.2)
ALBUMIN: 4.1 g/dL (ref 3.6–4.8)
ALK PHOS: 61 IU/L (ref 39–117)
ALT: 13 IU/L (ref 0–32)
AST: 13 IU/L (ref 0–40)
BUN / CREAT RATIO: 17 (ref 12–28)
BUN: 17 mg/dL (ref 8–27)
Bilirubin Total: 0.4 mg/dL (ref 0.0–1.2)
CO2: 23 mmol/L (ref 18–29)
Calcium: 9.3 mg/dL (ref 8.7–10.3)
Chloride: 96 mmol/L (ref 96–106)
Creatinine, Ser: 1 mg/dL (ref 0.57–1.00)
GFR calc Af Amer: 68 (ref >59)
GFR, EST NON AFRICAN AMERICAN: 59 — AB (ref >59)
GLOBULIN, TOTAL: 2.8 (ref 1.5–4.5)
Glucose: 392 mg/dL — ABNORMAL HIGH (ref 65–99)
POTASSIUM: 4.4 mmol/L (ref 3.5–5.2)
SODIUM: 135 mmol/L (ref 134–144)
Total Protein: 6.9 g/dL (ref 6.0–8.5)

## 2016-11-25 LAB — CBC WITH DIFFERENTIAL/PLATELET
BASOS: 0 %
Basophils Absolute: 0 10*3/uL (ref 0.0–0.2)
EOS (ABSOLUTE): 0.1 10*3/uL (ref 0.0–0.4)
Eos: 2 %
HEMATOCRIT: 37 % (ref 34.0–46.6)
Hemoglobin: 11.6 g/dL (ref 11.1–15.9)
IMMATURE GRANULOCYTES: 0 %
Immature Grans (Abs): 0 10*3/uL (ref 0.0–0.1)
Lymphocytes Absolute: 1.1 10*3/uL (ref 0.7–3.1)
Lymphs: 23 %
MCH: 29.5 pg (ref 26.6–33.0)
MCHC: 31.4 g/dL — AB (ref 31.5–35.7)
MCV: 94 fL (ref 79–97)
Monocytes Absolute: 0.2 10*3/uL (ref 0.1–0.9)
Monocytes: 4 %
NEUTROS PCT: 71 %
Neutrophils Absolute: 3.4 10*3/uL (ref 1.4–7.0)
PLATELETS: 188 10*3/uL (ref 150–379)
RBC: 3.93 x10E6/uL (ref 3.77–5.28)
RDW: 13.9 % (ref 12.3–15.4)
WBC: 4.8 10*3/uL (ref 3.4–10.8)

## 2016-11-25 LAB — LIPID PANEL W/O CHOL/HDL RATIO
Cholesterol, Total: 150 mg/dL (ref 100–199)
HDL: 53 mg/dL (ref >39)
LDL Calculated: 51 (ref 0–99)
Triglycerides: 229 mg/dL — ABNORMAL HIGH (ref 0–149)
VLDL Cholesterol Cal: 46 — ABNORMAL HIGH (ref 5–40)

## 2016-12-31 ENCOUNTER — Telehealth: Payer: Self-pay | Admitting: Unknown Physician Specialty

## 2016-12-31 MED ORDER — FLUCONAZOLE 150 MG PO TABS: 150.0000 mg | ORAL_TABLET | Freq: Once | ORAL | 0 refills | Status: AC

## 2016-12-31 NOTE — Telephone Encounter (Signed)
Patient called to see if Jessica Montoya would call her in something for her yeast infection.  Thanks

## 2016-12-31 NOTE — Telephone Encounter (Signed)
Routing to provider  

## 2017-02-16 ENCOUNTER — Telehealth: Payer: Self-pay | Admitting: Unknown Physician Specialty

## 2017-02-16 NOTE — Telephone Encounter (Signed)
Pt would like something sent to [facility] for a yeast infection.

## 2017-02-16 NOTE — Telephone Encounter (Signed)
Routing to provider  

## 2017-02-17 NOTE — Telephone Encounter (Signed)
Patient called to return a call that she received today.   Thank you

## 2017-02-17 NOTE — Telephone Encounter (Signed)
Please have her use the OTC monistat (not the 1 day, at least the 3 day)- if it doesn't get better, she will need to be swabbed to make sure it is yeast

## 2017-02-17 NOTE — Telephone Encounter (Signed)
Left message on machine for pt to return call to the office.  

## 2017-02-17 NOTE — Telephone Encounter (Signed)
Message relayed to patient. Verbalized understanding. Pt stated she had tried 3 day monostat w/o relief. Pt declined OV as she stated she is going out of town. Will call back when she returns if she continues to have issues.

## 2017-03-03 ENCOUNTER — Telehealth: Payer: Self-pay | Admitting: Unknown Physician Specialty

## 2017-03-03 NOTE — Telephone Encounter (Signed)
Called pt to schedule Annual Wellness Visit with Nurse Health Advisor for June:  - knb ° °

## 2017-04-18 ENCOUNTER — Emergency Department
Admission: EM | Admit: 2017-04-18 | Discharge: 2017-04-18 | Disposition: A | Discharge status: Home / Self Care | Payer: Medicare Other | Attending: Emergency Medicine | Admitting: Emergency Medicine

## 2017-04-18 ENCOUNTER — Emergency Department: Payer: Medicare Other

## 2017-04-18 ENCOUNTER — Encounter: Payer: Self-pay | Admitting: Emergency Medicine

## 2017-04-18 DIAGNOSIS — Y929 Unspecified place or not applicable: Secondary | ICD-10-CM | POA: Diagnosis not present

## 2017-04-18 DIAGNOSIS — W100XXA Fall (on)(from) escalator, initial encounter: Secondary | ICD-10-CM | POA: Diagnosis not present

## 2017-04-18 DIAGNOSIS — Y998 Other external cause status: Secondary | ICD-10-CM | POA: Insufficient documentation

## 2017-04-18 DIAGNOSIS — S82045A Nondisplaced comminuted fracture of left patella, initial encounter for closed fracture: Secondary | ICD-10-CM | POA: Insufficient documentation

## 2017-04-18 DIAGNOSIS — N182 Chronic kidney disease, stage 2 (mild): Secondary | ICD-10-CM | POA: Diagnosis not present

## 2017-04-18 DIAGNOSIS — W19XXXA Unspecified fall, initial encounter: Secondary | ICD-10-CM

## 2017-04-18 DIAGNOSIS — Y9389 Activity, other specified: Secondary | ICD-10-CM | POA: Diagnosis not present

## 2017-04-18 DIAGNOSIS — E1140 Type 2 diabetes mellitus with diabetic neuropathy, unspecified: Secondary | ICD-10-CM | POA: Insufficient documentation

## 2017-04-18 DIAGNOSIS — S8992XA Unspecified injury of left lower leg, initial encounter: Secondary | ICD-10-CM | POA: Diagnosis present

## 2017-04-18 DIAGNOSIS — R739 Hyperglycemia, unspecified: Secondary | ICD-10-CM

## 2017-04-18 DIAGNOSIS — I129 Hypertensive chronic kidney disease with stage 1 through stage 4 chronic kidney disease, or unspecified chronic kidney disease: Secondary | ICD-10-CM | POA: Insufficient documentation

## 2017-04-18 DIAGNOSIS — S82002A Unspecified fracture of left patella, initial encounter for closed fracture: Secondary | ICD-10-CM

## 2017-04-18 LAB — GLUCOSE, CAPILLARY: GLUCOSE-CAPILLARY: 408 mg/dL — AB (ref 65–99)

## 2017-04-18 LAB — CBC
HEMATOCRIT: 36.3 % (ref 35.0–47.0)
Hemoglobin: 12.4 g/dL (ref 12.0–16.0)
MCH: 30.6 pg (ref 26.0–34.0)
MCHC: 34.3 g/dL (ref 32.0–36.0)
MCV: 89.4 fL (ref 80.0–100.0)
Platelets: 217 10*3/uL (ref 150–440)
RBC: 4.06 MIL/uL (ref 3.80–5.20)
RDW: 13.8 % (ref 11.5–14.5)
WBC: 6.1 10*3/uL (ref 3.6–11.0)

## 2017-04-18 LAB — BASIC METABOLIC PANEL
Anion gap: 13 (ref 5–15)
BUN: 21 mg/dL — AB (ref 6–20)
CALCIUM: 9.4 mg/dL (ref 8.9–10.3)
CO2: 22 mmol/L (ref 22–32)
Chloride: 95 mmol/L — ABNORMAL LOW (ref 101–111)
Creatinine, Ser: 1.05 mg/dL — ABNORMAL HIGH (ref 0.44–1.00)
GFR calc Af Amer: >60 mL/min (ref >60)
GFR, EST NON AFRICAN AMERICAN: 54 mL/min — AB (ref >60)
Glucose, Bld: 527 mg/dL (ref 65–99)
POTASSIUM: 4.3 mmol/L (ref 3.5–5.1)
SODIUM: 130 mmol/L — AB (ref 135–145)

## 2017-04-18 MED ORDER — INSULIN ASPART 100 UNIT/ML ~~LOC~~ SOLN
10.0000 [IU] | Freq: Once | SUBCUTANEOUS | Status: AC
  Administered 2017-04-18: 10 [IU] via SUBCUTANEOUS
  Filled 2017-04-18: qty 0.1

## 2017-04-18 MED ORDER — OXYCODONE-ACETAMINOPHEN 5-325 MG PO TABS: 1.0000 | ORAL_TABLET | Freq: Four times a day (QID) | ORAL | 0 refills | Status: DC | PRN

## 2017-04-18 MED ORDER — MUPIROCIN 2 % EX OINT: TOPICAL_OINTMENT | CUTANEOUS | 0 refills | Status: DC

## 2017-04-18 MED ORDER — ONDANSETRON HCL 4 MG/2ML IJ SOLN
4.0000 mg | Freq: Once | INTRAMUSCULAR | Status: AC
  Administered 2017-04-18: 4 mg via INTRAVENOUS
  Filled 2017-04-18: qty 2

## 2017-04-18 MED ORDER — SODIUM CHLORIDE 0.9 % IV SOLN
Freq: Once | INTRAVENOUS | Status: AC
  Administered 2017-04-18: 15:00:00 via INTRAVENOUS

## 2017-04-18 MED ORDER — INSULIN ASPART 100 UNIT/ML ~~LOC~~ SOLN
SUBCUTANEOUS | Status: AC
  Administered 2017-04-18: 10 [IU] via SUBCUTANEOUS
  Filled 2017-04-18: qty 1

## 2017-04-18 MED ORDER — BACITRACIN-NEOMYCIN-POLYMYXIN 400-5-5000 EX OINT
TOPICAL_OINTMENT | CUTANEOUS | Status: AC
  Filled 2017-04-18: qty 1

## 2017-04-18 NOTE — ED Triage Notes (Signed)
[address] going down an escalator on Friday as she missed a step.  Has bruises all over, but is really here for her left knee, as it is painful so she cannot walk on it and now needs to use a cane.

## 2017-04-18 NOTE — ED Provider Notes (Signed)
[facility] Emergency Department Provider Note       Time seen: ----------------------------------------- 2:51 PM on 04/18/2017 -----------------------------------------     I have reviewed the triage vital signs and the nursing notes.   HISTORY   Chief Complaint Fall and Knee Injury    HPI Jessica Montoya is a 66 y.o. female who presents to the ED for a recent fall. Patient felt going down escalator on Friday where she missed a step. She presents with bruises and scattered abrasions over most of her body remained complaint is her left knee. Patient reports is painful to perform she has to walk with a walker. She does report that her blood sugar is elevated and has been elevated since the fall. She denies any recent illness or other complaints.   Past Medical History:  Diagnosis Date  . Anxiety   . Chronic kidney disease   . Chronic pain   . Depression   . Diabetes mellitus without complication ([place])   . Diabetic neuropathy ([place])   . Hyperlipidemia   . Hypertension   . Insomnia   . Long term current use of insulin ([place])   . Peripheral neuropathy     Patient Active Problem List   Diagnosis Date Noted  . Advanced care planning/counseling discussion 11/24/2016  . Chronic diarrhea 02/04/2016  . Insomnia 07/02/2015  . Depression 05/21/2015  . Acute anxiety 05/21/2015  . Peripheral neuropathy 05/21/2015  . Diabetes ([place]) 05/21/2015  . Hypertension 05/21/2015  . Diabetic neuropathy ([place]) 05/21/2015  . Hyperlipidemia 05/21/2015  . Chronic pain syndrome 05/21/2015  . CKD (chronic kidney disease), stage II 05/21/2015  . Long term current use of insulin ([place]) 05/21/2015  . Primary insomnia 05/21/2015  . Hypertensive CKD (chronic kidney disease) 05/21/2015  . GERD (gastroesophageal reflux disease) 05/21/2015  . Uncontrolled type 2 diabetes mellitus with chronic kidney disease ([place]) 05/21/2015    Past Surgical History:  Procedure Laterality  Date  . ABDOMINAL HYSTERECTOMY    . COLON SURGERY      Allergies Exenatide; Insulin aspart; Other; and Compazine [prochlorperazine edisylate]  Social History Social History  Substance Use Topics  . Smoking status: Never Smoker  . Smokeless tobacco: Never Used  . Alcohol use No    Review of Systems Constitutional: Negative for fever. Cardiovascular: Negative for chest pain. Respiratory: Negative for shortness of breath. Gastrointestinal: Negative for abdominal pain, vomiting and diarrhea. Genitourinary: Negative for dysuria. Musculoskeletal: Positive for left knee pain Skin: Positive for scattered bruising Neurological: Negative for headaches, focal weakness or numbness.  All systems negative/normal/unremarkable except as stated in the HPI  ____________________________________________   PHYSICAL EXAM:  VITAL SIGNS: ED Triage Vitals [04/18/17 1304]  Enc Vitals Group     BP 112/69     Pulse Rate 94     Resp 14     Temp (!) 97.4 F (36.3 C)     Temp Source Oral     SpO2 99 %     Weight 160 lb (72.6 kg)     Height 5\' 6"  (1.676 m)     Head Circumference      Peak Flow      Pain Score 10     Pain Loc      Pain Edu?      Excl. in [place]?    Constitutional: Alert and oriented. Well appearing and in no distress. Eyes: Conjunctivae are normal. Normal extraocular movements. ENT   Head: Normocephalic and atraumatic.   Nose: No congestion/rhinnorhea.  Mouth/Throat: Mucous membranes are moist.   Neck: No stridor. Cardiovascular: Normal rate, regular rhythm. No murmurs, rubs, or gallops. Respiratory: Normal respiratory effort without tachypnea nor retractions. Breath sounds are clear and equal bilaterally. No wheezes/rales/rhonchi. Gastrointestinal: Soft and nontender. Normal bowel sounds Musculoskeletal: Nontender with normal range of motion in extremities. Mild erythema and swelling of the left knee. There is an overlying left patellar abrasion. Negative  Lachman and McMurray testing on the left knee Neurologic:  Normal speech and language. No gross focal neurologic deficits are appreciated.  Skin:  Scattered bruising and abrasions are appreciated across much of her body sparing her chest, head and neck Psychiatric: Mood and affect are normal. Speech and behavior are normal.  ____________________________________________  ED COURSE:  Pertinent labs & imaging results that were available during my care of the patient were reviewed by me and considered in my medical decision making (see chart for details). Patient presents for a recent fall, we will assess with labs and imaging as indicated.   Procedures ____________________________________________   LABS (pertinent positives/negatives)  Labs Reviewed  BASIC METABOLIC PANEL - Abnormal; Notable for the following:       Result Value   Sodium 130 (*)    Chloride 95 (*)    Glucose, Bld 527 (*)    BUN 21 (*)    Creatinine, Ser 1.05 (*)    GFR calc non Af Amer 54 (*)    All other components within normal limits  GLUCOSE, CAPILLARY - Abnormal; Notable for the following:    Glucose-Capillary 408 (*)    All other components within normal limits  CBC  URINALYSIS, COMPLETE (UACMP) WITH MICROSCOPIC  CBG MONITORING, ED    RADIOLOGY Images were viewed by me  Left knee x-rays IMPRESSION: There is evidence concerning for patellar fracture in addition to bipartite patella. Small joint effusion. No other evidence suggesting fracture. No dislocation. No appreciable arthropathic change.  If clinical symptoms so warrant, CT through the patella may be helpful for further anatomic delineation. ____________________________________________  FINAL ASSESSMENT AND PLAN  Fall, knee contusion, hyperglycemia, Patellar fracture  Plan: Patient's labs and imaging were dictated above. Patient had presented for a fall which seems mechanical in origin. She does have evidence of patellar fracture for which  she was placed in a knee immobilizer. She also has hyperglycemia and was given fluids and insulin here with improvement. She is stable for outpatient orthopedics follow-up.   Earleen Newport, MD   Note: This note was generated in part or whole with voice recognition software. Voice recognition is usually quite accurate but there are transcription errors that can and very often do occur. I apologize for any typographical errors that were not detected and corrected.     Earleen Newport, MD 04/18/17 [id number]

## 2017-04-18 NOTE — ED Notes (Addendum)
PRIMARY RN NOTIFIED OF CBG RESULT AND MADE EDP AWARE.

## 2017-04-18 NOTE — ED Notes (Signed)
FN: pt reports falling down an escalator this past Friday. Scattered bruises noted to right shoulder and arms.

## 2017-04-27 DIAGNOSIS — S82002A Unspecified fracture of left patella, initial encounter for closed fracture: Secondary | ICD-10-CM | POA: Insufficient documentation

## 2017-05-02 ENCOUNTER — Ambulatory Visit (INDEPENDENT_AMBULATORY_CARE_PROVIDER_SITE_OTHER): Payer: Medicare Other | Admitting: Unknown Physician Specialty

## 2017-05-02 ENCOUNTER — Encounter: Payer: Self-pay | Admitting: Unknown Physician Specialty

## 2017-05-02 VITALS — BP 101/68 | HR 87 | Temp 97.9°F | Wt 158.4 lb

## 2017-05-02 DIAGNOSIS — N183 Chronic kidney disease, stage 3 unspecified: Secondary | ICD-10-CM

## 2017-05-02 DIAGNOSIS — I10 Essential (primary) hypertension: Secondary | ICD-10-CM | POA: Diagnosis not present

## 2017-05-02 DIAGNOSIS — Z794 Long term (current) use of insulin: Secondary | ICD-10-CM

## 2017-05-02 DIAGNOSIS — T07XXXA Unspecified multiple injuries, initial encounter: Secondary | ICD-10-CM

## 2017-05-02 DIAGNOSIS — E1122 Type 2 diabetes mellitus with diabetic chronic kidney disease: Secondary | ICD-10-CM | POA: Insufficient documentation

## 2017-05-02 DIAGNOSIS — S82092D Other fracture of left patella, subsequent encounter for closed fracture with routine healing: Secondary | ICD-10-CM | POA: Diagnosis not present

## 2017-05-02 DIAGNOSIS — E1165 Type 2 diabetes mellitus with hyperglycemia: Secondary | ICD-10-CM

## 2017-05-02 MED ORDER — FLUOXETINE HCL 20 MG PO CAPS: 20.0000 mg | ORAL_CAPSULE | Freq: Every day | ORAL | 3 refills | Status: DC

## 2017-05-02 MED ORDER — RAMIPRIL 2.5 MG PO CAPS: 2.5000 mg | ORAL_CAPSULE | Freq: Every day | ORAL | 3 refills | Status: DC

## 2017-05-02 MED ORDER — SIMVASTATIN 40 MG PO TABS: 40.0000 mg | ORAL_TABLET | Freq: Every day | ORAL | 3 refills | Status: DC

## 2017-05-02 MED ORDER — OMEPRAZOLE 20 MG PO CPDR: 20.0000 mg | DELAYED_RELEASE_CAPSULE | Freq: Every day | ORAL | 1 refills | Status: DC

## 2017-05-02 MED ORDER — GABAPENTIN 300 MG PO CAPS: 300.0000 mg | ORAL_CAPSULE | Freq: Two times a day (BID) | ORAL | 1 refills | Status: DC

## 2017-05-02 NOTE — Progress Notes (Signed)
BP 101/68   Pulse 87   Temp 97.9 F (36.6 C)   Wt 158 lb 6.4 oz (71.8 kg)   LMP  (LMP Unknown)   SpO2 99%   BMI 25.57 kg/m    Subjective:    Patient ID: Jessica Montoya, female    DOB: November 10, 1950, 66 y.o.   MRN: [id number]  HPI: Jessica Montoya is a 66 y.o. female  Chief Complaint  Patient presents with  . ER Follow Up    pt states she had a fall down an escalator on 04/15/17, seeing orthopedics for her left knee   Patient with recent mechanical fall while shopping, without loss in consciousness. Evaluated in emergency room and released with follow up with ortho for possible left knee surgery. Patient able to ambulate with cane/walker and knee immobilizer. Using Tramadol for pain 2-3 times daily with Tylenol as needed.  Hypertension Using medications without difficulty Average home BPs   No problems or lightheadedness No chest pain with exertion or shortness of breath No Edema   Hyperlipidemia Using medications without problems: No Muscle aches  Diet compliance   Diabetes With Endocrine.  Lost to f/u.  Blood sugar has been high since accident.     Relevant past medical, surgical, family and social history reviewed and updated as indicated. Interim medical history since our last visit reviewed. Allergies and medications reviewed and updated.  Review of Systems  Per HPI unless specifically indicated above     Objective:    BP 101/68   Pulse 87   Temp 97.9 F (36.6 C)   Wt 158 lb 6.4 oz (71.8 kg)   LMP  (LMP Unknown)   SpO2 99%   BMI 25.57 kg/m   Wt Readings from Last 3 Encounters:  05/02/17 158 lb 6.4 oz (71.8 kg)  04/18/17 160 lb (72.6 kg)  11/24/16 167 lb 9.6 oz (76 kg)    Physical Exam  Constitutional: She is oriented to person, place, and time. She appears well-developed and well-nourished. No distress.  HENT:  Head: Normocephalic and atraumatic.  Eyes: Conjunctivae and lids are normal. Right eye exhibits no discharge. Left eye exhibits  no discharge. No scleral icterus.  Neck: Normal range of motion. Neck supple. No JVD present. Carotid bruit is not present.  Cardiovascular: Normal rate, regular rhythm and normal heart sounds.   Pulmonary/Chest: Effort normal and breath sounds normal.  Abdominal: Normal appearance. There is no splenomegaly or hepatomegaly.  Musculoskeletal:  Knee in an immobilize  Neurological: She is alert and oriented to person, place, and time.  Skin: Skin is warm, dry and intact. No rash noted.  Multiple contusions  Psychiatric: She has a normal mood and affect. Her behavior is normal. Judgment and thought content normal.    Results for orders placed or performed during the hospital encounter of 65/68/12  Basic metabolic panel  Result Value Ref Range   Sodium 130 (L) 135 - 145 mmol/L   Potassium 4.3 3.5 - 5.1 mmol/L   Chloride 95 (L) 101 - 111 mmol/L   CO2 22 22 - 32 mmol/L   Glucose, Bld 527 (HH) 65 - 99 mg/dL   BUN 21 (H) 6 - 20 mg/dL   Creatinine, Ser 1.05 (H) 0.44 - 1.00 mg/dL   Calcium 9.4 8.9 - 10.3 mg/dL   GFR calc non Af Amer 54 (L) >60 mL/min   GFR calc Af Amer >60 >60 mL/min   Anion gap 13 5 - 15  CBC  Result Value  Ref Range   WBC 6.1 3.6 - 11.0 K/uL   RBC 4.06 3.80 - 5.20 MIL/uL   Hemoglobin 12.4 12.0 - 16.0 g/dL   HCT 36.3 35.0 - 47.0 %   MCV 89.4 80.0 - 100.0 fL   MCH 30.6 26.0 - 34.0 pg   MCHC 34.3 32.0 - 36.0 g/dL   RDW 13.8 11.5 - 14.5 %   Platelets 217 150 - 440 K/uL  Glucose, capillary  Result Value Ref Range   Glucose-Capillary 408 (H) 65 - 99 mg/dL   Comment 1 Notify RN    Comment 2 Document in Chart       Assessment & Plan:   Problem List Items Addressed This Visit      Unprioritized   Chronic kidney disease, stage 3    GFR 57 after reviewing ER records      Hypertension    Stable, continue present medications.        Relevant Medications   ramipril (ALTACE) 2.5 MG capsule   simvastatin (ZOCOR) 40 MG tablet   Uncontrolled type 2 diabetes mellitus  with chronic kidney disease ([place])    Encouraged f/u with Dr. [name]      Relevant Medications   ramipril (ALTACE) 2.5 MG capsule   simvastatin (ZOCOR) 40 MG tablet    Other Visit Diagnoses    Closed sleeve fracture of left patella with routine healing, subsequent encounter    -  Primary   Seeing Orthopedics   Multiple contusions       healing       Follow up plan: Return in about 6 months (around 11/02/2017).

## 2017-05-02 NOTE — Assessment & Plan Note (Signed)
Encouraged f/u with Dr. [name]

## 2017-05-02 NOTE — Assessment & Plan Note (Signed)
GFR 57 after reviewing ER records

## 2017-05-02 NOTE — Assessment & Plan Note (Signed)
Stable, continue present medications.   

## 2017-06-13 ENCOUNTER — Other Ambulatory Visit: Payer: Self-pay | Admitting: Unknown Physician Specialty

## 2017-07-01 ENCOUNTER — Telehealth: Payer: Self-pay | Admitting: Unknown Physician Specialty

## 2017-07-01 MED ORDER — FLUCONAZOLE 150 MG PO TABS: 150.0000 mg | ORAL_TABLET | Freq: Once | ORAL | 0 refills | Status: AC

## 2017-07-01 NOTE — Telephone Encounter (Signed)
ERROR

## 2017-07-01 NOTE — Telephone Encounter (Signed)
Routing to provider  

## 2017-07-01 NOTE — Telephone Encounter (Signed)
Patient would like Jessica Montoya to call her in something for a yeast infection.  [place]    [phone]  Thank You

## 2017-07-08 ENCOUNTER — Other Ambulatory Visit: Payer: Self-pay | Admitting: Unknown Physician Specialty

## 2017-07-29 ENCOUNTER — Telehealth: Payer: Self-pay

## 2017-07-29 NOTE — Telephone Encounter (Signed)
Called to schedule medicare wellness visit with patient. Patient believed she had one when she was her in July. Informed her that the appointment in July was for her knee fracture. Pt confirmed and states she is not able to come in at this time due to her husbands health. Will call back at a later time.

## 2017-09-28 ENCOUNTER — Ambulatory Visit: Payer: Medicare Other | Admitting: Unknown Physician Specialty

## 2017-09-28 ENCOUNTER — Ambulatory Visit: Payer: Medicare Other

## 2017-09-28 DIAGNOSIS — F5101 Primary insomnia: Secondary | ICD-10-CM

## 2017-09-28 DIAGNOSIS — B373 Candidiasis of vulva and vagina: Secondary | ICD-10-CM

## 2017-09-28 DIAGNOSIS — E1122 Type 2 diabetes mellitus with diabetic chronic kidney disease: Secondary | ICD-10-CM

## 2017-09-28 DIAGNOSIS — R634 Abnormal weight loss: Secondary | ICD-10-CM | POA: Diagnosis not present

## 2017-09-28 DIAGNOSIS — K219 Gastro-esophageal reflux disease without esophagitis: Secondary | ICD-10-CM | POA: Diagnosis not present

## 2017-09-28 DIAGNOSIS — E1165 Type 2 diabetes mellitus with hyperglycemia: Secondary | ICD-10-CM

## 2017-09-28 DIAGNOSIS — IMO0002 Reserved for concepts with insufficient information to code with codable children: Secondary | ICD-10-CM

## 2017-09-28 DIAGNOSIS — I10 Essential (primary) hypertension: Secondary | ICD-10-CM | POA: Diagnosis not present

## 2017-09-28 DIAGNOSIS — H1032 Unspecified acute conjunctivitis, left eye: Secondary | ICD-10-CM

## 2017-09-28 DIAGNOSIS — D649 Anemia, unspecified: Secondary | ICD-10-CM | POA: Diagnosis not present

## 2017-09-28 DIAGNOSIS — E782 Mixed hyperlipidemia: Secondary | ICD-10-CM

## 2017-09-28 DIAGNOSIS — B3731 Acute candidiasis of vulva and vagina: Secondary | ICD-10-CM

## 2017-09-28 MED ORDER — SIMVASTATIN 40 MG PO TABS: 40.0000 mg | ORAL_TABLET | Freq: Every day | ORAL | 0 refills | Status: DC

## 2017-09-28 MED ORDER — SIMVASTATIN 40 MG PO TABS: 40.0000 mg | ORAL_TABLET | Freq: Every day | ORAL | 3 refills | Status: DC

## 2017-09-28 MED ORDER — FLUOXETINE HCL 20 MG PO CAPS: 20.0000 mg | ORAL_CAPSULE | Freq: Every day | ORAL | 0 refills | Status: DC

## 2017-09-28 MED ORDER — RAMIPRIL 2.5 MG PO CAPS: 2.5000 mg | ORAL_CAPSULE | Freq: Every day | ORAL | 3 refills | Status: DC

## 2017-09-28 MED ORDER — GABAPENTIN 300 MG PO CAPS: 300.0000 mg | ORAL_CAPSULE | Freq: Two times a day (BID) | ORAL | 1 refills | Status: DC

## 2017-09-28 MED ORDER — OMEPRAZOLE 20 MG PO CPDR: 20.0000 mg | DELAYED_RELEASE_CAPSULE | Freq: Every day | ORAL | 1 refills | Status: DC

## 2017-09-28 MED ORDER — FLUCONAZOLE 150 MG PO TABS: 150.0000 mg | ORAL_TABLET | Freq: Once | ORAL | 0 refills | Status: DC

## 2017-09-28 MED ORDER — TRAZODONE HCL 50 MG PO TABS: 25.0000 mg | ORAL_TABLET | Freq: Every evening | ORAL | 3 refills | Status: DC | PRN

## 2017-09-28 MED ORDER — RAMIPRIL 2.5 MG PO CAPS: 2.5000 mg | ORAL_CAPSULE | Freq: Every day | ORAL | 0 refills | Status: DC

## 2017-09-28 MED ORDER — FLUCONAZOLE 150 MG PO TABS: 150.0000 mg | ORAL_TABLET | Freq: Once | ORAL | 0 refills | Status: AC

## 2017-09-28 MED ORDER — POLYMYXIN B-TRIMETHOPRIM 10000-0.1 UNIT/ML-% OP SOLN: 1.0000 [drp] | OPHTHALMIC | 0 refills | Status: DC

## 2017-09-28 NOTE — Assessment & Plan Note (Signed)
Stable, continue present medications.   

## 2017-09-28 NOTE — Progress Notes (Signed)
error 

## 2017-09-28 NOTE — Addendum Note (Signed)
Addended by: Kathrine Haddock on: 09/28/2017 04:23 PM   Modules accepted: Orders

## 2017-09-28 NOTE — Progress Notes (Addendum)
LMP  (LMP Unknown)    Subjective:    Patient ID: Jessica Montoya, female    DOB: May 27, 1951, 66 y.o.   MRN: [id number]  HPI: Jessica Montoya is a 66 y.o. female  Chief Complaint  Patient presents with  . Medication Management    Pt need montly supply sent to local pharmacy, and mail order  . Conjunctivitis    Better    Diabetes: Seeing Dr. [name] Taking 82 units of long acting daily and 22 units with meals.   No hypoglycemic episodes No hyperglycemic episodes Feet problems: none Last Hgb A1C: 13 She is experiencing weight loss  Hypertension  Using medications without difficulty Average home BPs 108/70   Using medication without problems or lightheadedness No chest pain with exertion or shortness of breath No Edema Dizzy with vertigo  Elevated Cholesterol Using medications without problems No Muscle aches  Diet: Exercise: No exercise.  Trying to control diet.    Insomnia Sleeping only about 2 hours/night  Depression Besides sleep, this is well controlled Depression screen [facility] 2/9 09/28/2017 09/28/2017 09/28/2017 05/02/2017 11/24/2016  Decreased Interest 1 1 1 1 1   Down, Depressed, Hopeless 0 0 0 0 0  PHQ - 2 Score 1 1 1 1 1   Altered sleeping 3 3 - 2 -  Tired, decreased energy 3 3 - 1 -  Change in appetite 3 3 - 0 -  Feeling bad or failure about yourself  1 1 - 0 -  Trouble concentrating 1 1 - 0 -  Moving slowly or fidgety/restless 1 1 - 0 -  Suicidal thoughts 0 0 - 0 -  PHQ-9 Score 13 13 - 4 -   Pink eye Started left eye.  Red and matting.  Using husbands medication and almost out.    Relevant past medical, surgical, family and social history reviewed and updated as indicated. Interim medical history since our last visit reviewed. Allergies and medications reviewed and updated.  Review of Systems  Eyes: Positive for discharge and itching.  Respiratory: Negative.   Cardiovascular: Negative.   Gastrointestinal: Negative.   Genitourinary:   Has a yeast infection from high blood sugar    Per HPI unless specifically indicated above     Objective:    LMP  (LMP Unknown)   Wt Readings from Last 3 Encounters:  05/02/17 158 lb 6.4 oz (71.8 kg)  04/18/17 160 lb (72.6 kg)  11/24/16 167 lb 9.6 oz (76 kg)    Physical Exam  Constitutional: She is oriented to person, place, and time. She appears well-developed and well-nourished. No distress.  HENT:  Head: Normocephalic and atraumatic.  Eyes: Lids are normal. Right eye exhibits no discharge. Left eye exhibits no discharge. Right conjunctiva is injected. No scleral icterus.  Neck: Normal range of motion. Neck supple. No JVD present. Carotid bruit is not present.  Cardiovascular: Normal rate, regular rhythm and normal heart sounds.  Pulmonary/Chest: Effort normal and breath sounds normal.  Abdominal: Normal appearance. There is no splenomegaly or hepatomegaly.  Musculoskeletal: Normal range of motion.  Neurological: She is alert and oriented to person, place, and time.  Skin: Skin is warm, dry and intact. No rash noted. No pallor.  Psychiatric: She has a normal mood and affect. Her behavior is normal. Judgment and thought content normal.    Results for orders placed or performed during the hospital encounter of 63/01/60  Basic metabolic panel  Result Value Ref Range   Sodium 130 (L) 135 - 145 mmol/L  Potassium 4.3 3.5 - 5.1 mmol/L   Chloride 95 (L) 101 - 111 mmol/L   CO2 22 22 - 32 mmol/L   Glucose, Bld 527 (HH) 65 - 99 mg/dL   BUN 21 (H) 6 - 20 mg/dL   Creatinine, Ser 1.05 (H) 0.44 - 1.00 mg/dL   Calcium 9.4 8.9 - 10.3 mg/dL   GFR calc non Af Amer 54 (L) >60 mL/min   GFR calc Af Amer >60 >60 mL/min   Anion gap 13 5 - 15  CBC  Result Value Ref Range   WBC 6.1 3.6 - 11.0 K/uL   RBC 4.06 3.80 - 5.20 MIL/uL   Hemoglobin 12.4 12.0 - 16.0 g/dL   HCT 36.3 35.0 - 47.0 %   MCV 89.4 80.0 - 100.0 fL   MCH 30.6 26.0 - 34.0 pg   MCHC 34.3 32.0 - 36.0 g/dL   RDW 13.8 11.5 -  14.5 %   Platelets 217 150 - 440 K/uL  Glucose, capillary  Result Value Ref Range   Glucose-Capillary 408 (H) 65 - 99 mg/dL   Comment 1 Notify RN    Comment 2 Document in Chart       Assessment & Plan:   Problem List Items Addressed This Visit      Unprioritized   Anemia    Taking iron.  Check CMP      Relevant Orders   CBC with Differential/Platelet   GERD (gastroesophageal reflux disease)    Stable, continue present medications.        Relevant Medications   omeprazole (PRILOSEC) 20 MG capsule   Hyperlipidemia    Stable, continue present medications.        Relevant Medications   simvastatin (ZOCOR) 40 MG tablet   ramipril (ALTACE) 2.5 MG capsule   Other Relevant Orders   Lipid Panel w/o Chol/HDL Ratio   Hypertension    Stable, continue present medications.        Relevant Medications   simvastatin (ZOCOR) 40 MG tablet   ramipril (ALTACE) 2.5 MG capsule   Insomnia    Add Trazadone 50 mg QHS.        Uncontrolled type 2 diabetes mellitus with chronic kidney disease ([place])    Per Dr. [name].  Labs reviewed      Relevant Medications   simvastatin (ZOCOR) 40 MG tablet   ramipril (ALTACE) 2.5 MG capsule    Other Visit Diagnoses    Vaginal yeast infection    -  Primary   Relevant Medications   fluconazole (DIFLUCAN) 150 MG tablet   Weight loss       Suspect due to poorly controlled blood sugar   Acute bacterial conjunctivitis of left eye       Relevant Medications   fluconazole (DIFLUCAN) 150 MG tablet      Overdue health maintenance.  Schedule physical and wellness exam in February CMP and Hgb A1C done through Dr. [name]  Follow up plan: Return in about 4 weeks (around 10/26/2017) for physical.

## 2017-09-28 NOTE — Assessment & Plan Note (Signed)
Per Dr. [name].  Labs reviewed

## 2017-09-28 NOTE — Assessment & Plan Note (Signed)
Add Trazadone 50 mg QHS.

## 2017-09-28 NOTE — Assessment & Plan Note (Signed)
Taking iron.  Check CMP

## 2017-09-29 LAB — CBC WITH DIFFERENTIAL/PLATELET
BASOS: 0 %
Basophils Absolute: 0 10*3/uL (ref 0.0–0.2)
EOS (ABSOLUTE): 0.2 10*3/uL (ref 0.0–0.4)
Eos: 4 %
Hematocrit: 38.3 % (ref 34.0–46.6)
Hemoglobin: 12.2 g/dL (ref 11.1–15.9)
IMMATURE GRANULOCYTES: 0 %
Immature Grans (Abs): 0 10*3/uL (ref 0.0–0.1)
Lymphocytes Absolute: 0.7 10*3/uL (ref 0.7–3.1)
Lymphs: 16 %
MCH: 30.6 pg (ref 26.6–33.0)
MCHC: 31.9 g/dL (ref 31.5–35.7)
MCV: 96 fL (ref 79–97)
Monocytes Absolute: 0.2 10*3/uL (ref 0.1–0.9)
Monocytes: 4 %
Neutrophils Absolute: 3.3 10*3/uL (ref 1.4–7.0)
Neutrophils: 76 %
PLATELETS: 205 10*3/uL (ref 150–379)
RBC: 3.99 x10E6/uL (ref 3.77–5.28)
RDW: 13 % (ref 12.3–15.4)
WBC: 4.3 10*3/uL (ref 3.4–10.8)

## 2017-09-29 LAB — LIPID PANEL W/O CHOL/HDL RATIO
CHOLESTEROL TOTAL: 163 mg/dL (ref 100–199)
HDL: 52 mg/dL (ref >39)
LDL Calculated: 61 mg/dL (ref 0–99)
TRIGLYCERIDES: 252 mg/dL — AB (ref 0–149)
VLDL CHOLESTEROL CAL: 50 mg/dL — AB (ref 5–40)

## 2017-09-30 ENCOUNTER — Encounter: Payer: Self-pay | Admitting: Unknown Physician Specialty

## 2017-10-11 LAB — HM DEXA SCAN

## 2017-11-02 ENCOUNTER — Ambulatory Visit: Payer: Medicare Other | Admitting: Unknown Physician Specialty

## 2017-11-09 ENCOUNTER — Encounter: Payer: Self-pay | Admitting: Unknown Physician Specialty

## 2017-11-09 ENCOUNTER — Ambulatory Visit: Payer: Medicare Other | Admitting: Unknown Physician Specialty

## 2017-11-09 DIAGNOSIS — I10 Essential (primary) hypertension: Secondary | ICD-10-CM | POA: Diagnosis not present

## 2017-11-09 DIAGNOSIS — M1990 Unspecified osteoarthritis, unspecified site: Secondary | ICD-10-CM | POA: Insufficient documentation

## 2017-11-09 DIAGNOSIS — F5101 Primary insomnia: Secondary | ICD-10-CM | POA: Diagnosis not present

## 2017-11-09 DIAGNOSIS — M159 Polyosteoarthritis, unspecified: Secondary | ICD-10-CM

## 2017-11-09 DIAGNOSIS — M150 Primary generalized (osteo)arthritis: Secondary | ICD-10-CM

## 2017-11-09 DIAGNOSIS — M8580 Other specified disorders of bone density and structure, unspecified site: Secondary | ICD-10-CM | POA: Insufficient documentation

## 2017-11-09 DIAGNOSIS — E782 Mixed hyperlipidemia: Secondary | ICD-10-CM

## 2017-11-09 NOTE — Progress Notes (Signed)
BP 107/68   Pulse 89   Temp 97.6 F (36.4 C) (Oral)   Wt 153 lb 3.2 oz (69.5 kg)   LMP  (LMP Unknown)   SpO2 99%   BMI 24.73 kg/m    Subjective:    Patient ID: Jessica Montoya, female    DOB: 02-08-1951, 67 y.o.   MRN: [id number]  HPI: Jessica Montoya is a 67 y.o. female  Chief Complaint  Patient presents with  . Follow-up    4 week f/up   Diabetes: Taking 90 units of Lantus at night and 26 u of Humalog before meals.   No hypoglycemic episodes No hyperglycemic episodes Feet problems: Numbness, pain and tingling Blood Sugars averaging:180-210 eye exam within last year Last Hgb A1C: 13.0  Hypertension  Using medications without difficulty Average home BPs High 120's/68   Using medication without problems or lightheadedness No chest pain with exertion or shortness of breath No Edema  Elevated Cholesterol Using medications without problems No Muscle aches  Diet: States it is "pretty good"   Exercise:Hasn't been great with the weather  Insomnia States she is not sleeping and it's "driving me nuts."  She thinks much has to do with her pain which includes between her shoulder blades and in the areas of OA as described below.  States even with the Trazadone 50 mg, she doesn't get to sleep until 2A and wakes up around 10A.  Some days she doesn't sleep at all.    OA Went to see Orthopedics and diagnosed with arthritis in ankles, feet, knees, and hands.  Given Meloxicam.     Relevant past medical, surgical, family and social history reviewed and updated as indicated. Interim medical history since our last visit reviewed. Allergies and medications reviewed and updated.  Review of Systems  Constitutional: Negative.   Respiratory: Negative.   Cardiovascular: Negative.   Gastrointestinal: Negative.     Per HPI unless specifically indicated above     Objective:    BP 107/68   Pulse 89   Temp 97.6 F (36.4 C) (Oral)   Wt 153 lb 3.2 oz (69.5 kg)   LMP   (LMP Unknown)   SpO2 99%   BMI 24.73 kg/m   Wt Readings from Last 3 Encounters:  11/09/17 153 lb 3.2 oz (69.5 kg)  05/02/17 158 lb 6.4 oz (71.8 kg)  04/18/17 160 lb (72.6 kg)    Physical Exam  Constitutional: She is oriented to person, place, and time. She appears well-developed and well-nourished. No distress.  HENT:  Head: Normocephalic and atraumatic.  Eyes: Conjunctivae and lids are normal. Right eye exhibits no discharge. Left eye exhibits no discharge. No scleral icterus.  Neck: Normal range of motion. Neck supple. No JVD present. Carotid bruit is not present.  Cardiovascular: Normal rate, regular rhythm and normal heart sounds.  Pulmonary/Chest: Effort normal and breath sounds normal.  Abdominal: Normal appearance. There is no splenomegaly or hepatomegaly.  Musculoskeletal: Normal range of motion.  Neurological: She is alert and oriented to person, place, and time.  Skin: Skin is warm, dry and intact. No rash noted. No pallor.  Psychiatric: She has a normal mood and affect. Her behavior is normal. Judgment and thought content normal.    Results for orders placed or performed in visit on 11/09/17  HM DEXA SCAN  Result Value Ref Range   HM Dexa Scan in Care Everywhere under [facility] system       Assessment & Plan:   Problem List Items  Addressed This Visit      Unprioritized   Hyperlipidemia    Levels have been stable.  CMP being done with Dr. [name].  All WNL last visit on review.        Hypertension    Stable, continue present medications.        Insomnia    Earliest pt gets to sleep is 2A.  However goes to bed at 11p.  I am recommending she does not go to bed until 2A when she is sleepy.  Take take Trazadone between 1 and 1:30.        Osteoarthritis    Multiple sites.  Discussed activity including yoga and walking.  She wants a handicapped sticker.  I encouraged her to walk as opposed to using a handicapped sticker      Relevant Medications   meloxicam (MOBIC)  15 MG tablet       Follow up plan: Return in about 6 months (around 05/09/2018).

## 2017-11-09 NOTE — Assessment & Plan Note (Signed)
Earliest pt gets to sleep is 2A.  However goes to bed at 11p.  I am recommending she does not go to bed until 2A when she is sleepy.  Take take Trazadone between 1 and 1:30.

## 2017-11-09 NOTE — Assessment & Plan Note (Signed)
Multiple sites.  Discussed activity including yoga and walking.  She wants a handicapped sticker.  I encouraged her to walk as opposed to using a handicapped sticker

## 2017-11-09 NOTE — Assessment & Plan Note (Signed)
Levels have been stable.  CMP being done with Dr. [name].  All WNL last visit on review.

## 2017-11-09 NOTE — Assessment & Plan Note (Signed)
Stable, continue present medications.   

## 2017-11-23 ENCOUNTER — Telehealth: Payer: Self-pay

## 2017-11-23 MED ORDER — FLUCONAZOLE 150 MG PO TABS: 150.0000 mg | ORAL_TABLET | Freq: Once | ORAL | 0 refills | Status: AC

## 2017-11-23 NOTE — Telephone Encounter (Signed)
Copied from [place] [id number]. Topic: General - Other >> Nov 23, 2017  1:35 PM Carolyn Stare wrote:  Pt call to ask if something can be called in for a yeast infection   Pharmacy  [facility] to provider.

## 2017-11-23 NOTE — Telephone Encounter (Signed)
Called and left patient a VM letting her know that a medication was sent in for her.  

## 2018-01-04 LAB — HEMOGLOBIN A1C: Hemoglobin A1C: 9.9

## 2018-01-20 ENCOUNTER — Other Ambulatory Visit: Payer: Self-pay | Admitting: Unknown Physician Specialty

## 2018-02-25 ENCOUNTER — Other Ambulatory Visit: Payer: Self-pay | Admitting: Unknown Physician Specialty

## 2018-02-28 NOTE — Telephone Encounter (Signed)
Trazodone refill Last OV: 11/09/17 Last Refill:09/28/17 #30 tab 3 RF [facility] [address], PCP: Kathrine Haddock NP

## 2018-03-01 ENCOUNTER — Ambulatory Visit: Payer: Medicare Other | Admitting: Unknown Physician Specialty

## 2018-03-01 ENCOUNTER — Encounter: Payer: Self-pay | Admitting: Unknown Physician Specialty

## 2018-03-01 VITALS — BP 109/73 | HR 107 | Wt 154.0 lb

## 2018-03-01 DIAGNOSIS — M79605 Pain in left leg: Secondary | ICD-10-CM | POA: Diagnosis not present

## 2018-03-01 DIAGNOSIS — R0789 Other chest pain: Secondary | ICD-10-CM | POA: Diagnosis not present

## 2018-03-01 DIAGNOSIS — R5383 Other fatigue: Secondary | ICD-10-CM | POA: Diagnosis not present

## 2018-03-01 DIAGNOSIS — R0602 Shortness of breath: Secondary | ICD-10-CM | POA: Diagnosis not present

## 2018-03-01 DIAGNOSIS — M79604 Pain in right leg: Secondary | ICD-10-CM

## 2018-03-01 MED ORDER — AMITRIPTYLINE HCL 10 MG PO TABS: 20.0000 mg | ORAL_TABLET | Freq: Every evening | ORAL | 1 refills | Status: DC | PRN

## 2018-03-01 MED ORDER — FLUCONAZOLE 150 MG PO TABS: 150.0000 mg | ORAL_TABLET | Freq: Once | ORAL | 0 refills | Status: AC

## 2018-03-01 NOTE — Progress Notes (Signed)
BP 109/73   Pulse (!) 107   Wt 154 lb (69.9 kg)   LMP  (LMP Unknown)   SpO2 99%   BMI 24.86 kg/m    Subjective:    Patient ID: Jessica Montoya, female    DOB: 02-05-1951, 67 y.o.   MRN: [id number]  HPI: Jessica Montoya is a 67 y.o. female  Chief Complaint  Patient presents with  . Fatigue    Weak, Stumbling, felt this way 6 years ago when d/x w/ cancer, voice has become gravely over last 2 weeks. Denies pain, irriation, itching   Fatigue:  Pt complaining of complete exhaustion worsening over the last 5-6 months.  Had to stop 3 times getting ready today.  States when she stands, she stumbles and goes sideways and forward.  Denies vertigo.  Voice is hoarse.  Has a dry mouth and things taste salty since started Amitriptyline 50 mg. due to leg pain.   She has not seen cardiology in many years.     DiabetesSees Dr. [name].  Takes 100 u of insulin QHS and 30 units of humalog with meals.  Hgb A1C was 9.9 at her last visit but down from 13.  States she needs medication for a yeast infecton  I have reviewed her labs from Dr [name].  Besides an elevated blood sugar.  Blood chemistries are good.    Relevant past medical, surgical, family and social history reviewed and updated as indicated. Interim medical history since our last visit reviewed. Allergies and medications reviewed and updated.  Review of Systems  Constitutional: Positive for fatigue.  HENT: Positive for voice change.   Respiratory: Positive for chest tightness and shortness of breath.   Cardiovascular:       Chest pressure  Gastrointestinal: Negative.   Musculoskeletal: Negative.     Per HPI unless specifically indicated above     Objective:    BP 109/73   Pulse (!) 107   Wt 154 lb (69.9 kg)   LMP  (LMP Unknown)   SpO2 99%   BMI 24.86 kg/m   Wt Readings from Last 3 Encounters:  03/01/18 154 lb (69.9 kg)  11/09/17 153 lb 3.2 oz (69.5 kg)  05/02/17 158 lb 6.4 oz (71.8 kg)    Physical Exam    Constitutional: She is oriented to person, place, and time. She appears well-developed and well-nourished. No distress.  HENT:  Head: Normocephalic and atraumatic.  Eyes: Conjunctivae and lids are normal. Right eye exhibits no discharge. Left eye exhibits no discharge. No scleral icterus.  Neck: Normal range of motion. Neck supple. No JVD present. Carotid bruit is not present.  Cardiovascular: Normal rate, regular rhythm and normal heart sounds.  Pulmonary/Chest: Effort normal and breath sounds normal.  Abdominal: Normal appearance. There is no splenomegaly or hepatomegaly.  Musculoskeletal: Normal range of motion.  Neurological: She is alert and oriented to person, place, and time.  Skin: Skin is warm, dry and intact. No rash noted. No pallor.  Psychiatric: She has a normal mood and affect. Her behavior is normal. Judgment and thought content normal.   EKG NSR.  No acute changes.   P axis rotation.    Results for orders placed or performed in visit on 11/09/17  HM DEXA SCAN  Result Value Ref Range   HM Dexa Scan in Care Everywhere under [facility] system       Assessment & Plan:   Problem List Items Addressed This Visit    None  Visit Diagnoses    SOB (shortness of breath)    -  Primary   suspect multifactoral but cardiac and pulmonary hypertension must be ruled out   Relevant Orders   EKG 12-Lead (Completed)   Chest pressure       Due to extreme fatigue, SOB, and chest pressure, this high risk pt needs to see cardiology.  Will set up appt with Dr. Fletcher Anon   Relevant Orders   Ambulatory referral to Cardiology   Other fatigue       Extreme.  Check labs: CBC, TSH.  CMP was normal.  DM under poor control but normal for her   Relevant Orders   CBC with Differential/Platelet   TSH   Pain in both lower extremities       Decrease Amitriptyline to 10 mg to take 1-2 QHS       Follow up plan: Return in about 6 weeks (around 04/12/2018).

## 2018-03-02 ENCOUNTER — Encounter: Payer: Self-pay | Admitting: Unknown Physician Specialty

## 2018-03-02 LAB — CBC WITH DIFFERENTIAL/PLATELET
Basophils Absolute: 0 10*3/uL (ref 0.0–0.2)
Basos: 0 %
EOS (ABSOLUTE): 0.1 10*3/uL (ref 0.0–0.4)
EOS: 2 %
HEMATOCRIT: 39.8 % (ref 34.0–46.6)
Hemoglobin: 13 g/dL (ref 11.1–15.9)
Immature Grans (Abs): 0 10*3/uL (ref 0.0–0.1)
Immature Granulocytes: 0 %
Lymphocytes Absolute: 0.9 10*3/uL (ref 0.7–3.1)
Lymphs: 17 %
MCH: 31 pg (ref 26.6–33.0)
MCHC: 32.7 g/dL (ref 31.5–35.7)
MCV: 95 fL (ref 79–97)
MONOS ABS: 0.2 10*3/uL (ref 0.1–0.9)
Monocytes: 4 %
NEUTROS ABS: 3.8 10*3/uL (ref 1.4–7.0)
Neutrophils: 77 %
Platelets: 201 10*3/uL (ref 150–450)
RBC: 4.19 x10E6/uL (ref 3.77–5.28)
RDW: 13.5 % (ref 12.3–15.4)
WBC: 5 10*3/uL (ref 3.4–10.8)

## 2018-03-02 LAB — TSH: TSH: 1.46 u[IU]/mL (ref 0.450–4.500)

## 2018-03-20 ENCOUNTER — Other Ambulatory Visit: Payer: Self-pay | Admitting: Unknown Physician Specialty

## 2018-03-24 ENCOUNTER — Other Ambulatory Visit: Payer: Self-pay | Admitting: Unknown Physician Specialty

## 2018-03-27 NOTE — Telephone Encounter (Signed)
Elavil 10 mg refill request   Pharmacy requesting 90 day supply  LOV 03/01/18 with Kathrine Haddock   (Used for sleep)  [facility] [place] [address]

## 2018-04-10 ENCOUNTER — Telehealth: Payer: Self-pay | Admitting: Unknown Physician Specialty

## 2018-04-10 MED ORDER — FLUCONAZOLE 150 MG PO TABS: 150.0000 mg | ORAL_TABLET | Freq: Once | ORAL | 2 refills | Status: AC

## 2018-04-10 NOTE — Telephone Encounter (Signed)
Copied from [place] [id number]. Topic: Quick Communication - See Telephone Encounter >> Apr 10, 2018  2:41 PM Gardiner Ramus wrote: CRM for notification. See Telephone encounter for: 04/10/18. Pt would like something called in for a yeast infection. Please advise. Cb#[phone] symptoms include itching and burning  [facility]

## 2018-04-11 NOTE — Telephone Encounter (Signed)
I sent in Diflucan.  No appt needed

## 2018-04-11 NOTE — Telephone Encounter (Signed)
I do not see that this medication was called in nor denied does this pt need an appt? Please advise.

## 2018-04-18 ENCOUNTER — Encounter: Payer: Self-pay | Admitting: Unknown Physician Specialty

## 2018-05-05 ENCOUNTER — Other Ambulatory Visit: Payer: Self-pay | Admitting: Unknown Physician Specialty

## 2018-05-05 NOTE — Telephone Encounter (Signed)
Denied. Please schedule OV. Has not been assessed for this > 1 year, last refill 8 mo ago.

## 2018-05-05 NOTE — Telephone Encounter (Signed)
fluoxetine refill Last Refill:09/28/17 # 30 Last OV: 05/21/15 PCP: Kathrine Haddock NP Pharmacy:[facility]

## 2018-05-08 NOTE — Telephone Encounter (Signed)
Patient notified

## 2018-05-09 ENCOUNTER — Encounter: Payer: Medicare Other | Admitting: Unknown Physician Specialty

## 2018-05-11 ENCOUNTER — Ambulatory Visit: Payer: Medicare Other | Admitting: Physician Assistant

## 2018-05-11 ENCOUNTER — Encounter: Payer: Self-pay | Admitting: Physician Assistant

## 2018-05-11 VITALS — BP 130/74 | HR 104 | Ht 66.0 in | Wt 154.0 lb

## 2018-05-11 DIAGNOSIS — E782 Mixed hyperlipidemia: Secondary | ICD-10-CM | POA: Diagnosis not present

## 2018-05-11 DIAGNOSIS — E1149 Type 2 diabetes mellitus with other diabetic neurological complication: Secondary | ICD-10-CM | POA: Diagnosis not present

## 2018-05-11 DIAGNOSIS — Z1231 Encounter for screening mammogram for malignant neoplasm of breast: Secondary | ICD-10-CM

## 2018-05-11 DIAGNOSIS — I10 Essential (primary) hypertension: Secondary | ICD-10-CM | POA: Diagnosis not present

## 2018-05-11 DIAGNOSIS — Z1239 Encounter for other screening for malignant neoplasm of breast: Secondary | ICD-10-CM

## 2018-05-11 DIAGNOSIS — E1122 Type 2 diabetes mellitus with diabetic chronic kidney disease: Secondary | ICD-10-CM | POA: Diagnosis not present

## 2018-05-11 DIAGNOSIS — IMO0002 Reserved for concepts with insufficient information to code with codable children: Secondary | ICD-10-CM

## 2018-05-11 DIAGNOSIS — Z85038 Personal history of other malignant neoplasm of large intestine: Secondary | ICD-10-CM

## 2018-05-11 DIAGNOSIS — E1165 Type 2 diabetes mellitus with hyperglycemia: Secondary | ICD-10-CM

## 2018-05-11 DIAGNOSIS — Z0000 Encounter for general adult medical examination without abnormal findings: Secondary | ICD-10-CM | POA: Diagnosis not present

## 2018-05-11 LAB — BAYER DCA HB A1C WAIVED: HB A1C (BAYER DCA - WAIVED): 12.9 % — ABNORMAL HIGH (ref <7.0)

## 2018-05-11 NOTE — Patient Instructions (Addendum)
Main: [place] Maintenance, Female Adopting a healthy lifestyle and getting preventive care can go a long way to promote health and wellness. Talk with your health care provider about what schedule of regular examinations is right for you. This is a good chance for you to check in with your provider about disease prevention and staying healthy. In between checkups, there are plenty of things you can do on your own. Experts have done a lot of research about which lifestyle changes and preventive measures are most likely to keep you healthy. Ask your health care provider for more information. Weight and diet Eat a healthy diet  Be sure to include plenty of vegetables, fruits, low-fat dairy products, and lean protein.  Do not eat a lot of foods high in solid fats, added sugars, or salt.  Get regular exercise. This is one of the most important things you can do for your health. ? Most adults should exercise for at least 150 minutes each week. The exercise should increase your heart rate and make you sweat (moderate-intensity exercise). ? Most adults should also do strengthening exercises at least twice a week. This is in addition to the moderate-intensity exercise.  Maintain a healthy weight  Body mass index (BMI) is a measurement that can be used to identify possible weight problems. It estimates body fat based on height and weight. Your health care provider can help determine your BMI and help you achieve or maintain a healthy weight.  For females 80 years of age and older: ? A BMI below 18.5 is considered underweight. ? A BMI of 18.5 to 24.9 is normal. ? A BMI of 25 to 29.9 is considered overweight. ? A BMI of 30 and above is considered obese.  Watch levels of cholesterol and blood lipids  You should start having your blood tested for lipids and cholesterol at 68 years of age, then have this test every 5 years.  You may need to have your cholesterol  levels checked more often if: ? Your lipid or cholesterol levels are high. ? You are older than 68 years of age. ? You are at high risk for heart disease.  Cancer screening Lung Cancer  Lung cancer screening is recommended for adults 70-47 years old who are at high risk for lung cancer because of a history of smoking.  A yearly low-dose CT scan of the lungs is recommended for people who: ? Currently smoke. ? Have quit within the past 15 years. ? Have at least a 30-pack-year history of smoking. A pack year is smoking an average of one pack of cigarettes a day for 1 year.  Yearly screening should continue until it has been 15 years since you quit.  Yearly screening should stop if you develop a health problem that would prevent you from having lung cancer treatment.  Breast Cancer  Practice breast self-awareness. This means understanding how your breasts normally appear and feel.  It also means doing regular breast self-exams. Let your health care provider know about any changes, no matter how small.  If you are in your 20s or 30s, you should have a clinical breast exam (CBE) by a health care provider every 1-3 years as part of a regular health exam.  If you are 38 or older, have a CBE every year. Also consider having a breast X-ray (mammogram) every year.  If you have a family history of breast cancer, talk to your health care provider about genetic  screening.  If you are at high risk for breast cancer, talk to your health care provider about having an MRI and a mammogram every year.  Breast cancer gene (BRCA) assessment is recommended for women who have family members with BRCA-related cancers. BRCA-related cancers include: ? Breast. ? Ovarian. ? Tubal. ? Peritoneal cancers.  Results of the assessment will determine the need for genetic counseling and BRCA1 and BRCA2 testing.  Cervical Cancer Your health care provider may recommend that you be screened regularly for cancer of  the pelvic organs (ovaries, uterus, and vagina). This screening involves a pelvic examination, including checking for microscopic changes to the surface of your cervix (Pap test). You may be encouraged to have this screening done every 3 years, beginning at age 21.  For women ages 30-65, health care providers may recommend pelvic exams and Pap testing every 3 years, or they may recommend the Pap and pelvic exam, combined with testing for human papilloma virus (HPV), every 5 years. Some types of HPV increase your risk of cervical cancer. Testing for HPV may also be done on women of any age with unclear Pap test results.  Other health care providers may not recommend any screening for nonpregnant women who are considered low risk for pelvic cancer and who do not have symptoms. Ask your health care provider if a screening pelvic exam is right for you.  If you have had past treatment for cervical cancer or a condition that could lead to cancer, you need Pap tests and screening for cancer for at least 20 years after your treatment. If Pap tests have been discontinued, your risk factors (such as having a new sexual partner) need to be reassessed to determine if screening should resume. Some women have medical problems that increase the chance of getting cervical cancer. In these cases, your health care provider may recommend more frequent screening and Pap tests.  Colorectal Cancer  This type of cancer can be detected and often prevented.  Routine colorectal cancer screening usually begins at 68 years of age and continues through 68 years of age.  Your health care provider may recommend screening at an earlier age if you have risk factors for colon cancer.  Your health care provider may also recommend using home test kits to check for hidden blood in the stool.  A small camera at the end of a tube can be used to examine your colon directly (sigmoidoscopy or colonoscopy). This is done to check for the  earliest forms of colorectal cancer.  Routine screening usually begins at age 50.  Direct examination of the colon should be repeated every 5-10 years through 68 years of age. However, you may need to be screened more often if early forms of precancerous polyps or small growths are found.  Skin Cancer  Check your skin from head to toe regularly.  Tell your health care provider about any new moles or changes in moles, especially if there is a change in a mole's shape or color.  Also tell your health care provider if you have a mole that is larger than the size of a pencil eraser.  Always use sunscreen. Apply sunscreen liberally and repeatedly throughout the day.  Protect yourself by wearing long sleeves, pants, a wide-brimmed hat, and sunglasses whenever you are outside.  Heart disease, diabetes, and high blood pressure  High blood pressure causes heart disease and increases the risk of stroke. High blood pressure is more likely to develop in: ? People who   have blood pressure in the high end of the normal range (130-139/85-89 mm Hg). ? People who are overweight or obese. ? People who are African American.  If you are 19-29 years of age, have your blood pressure checked every 3-5 years. If you are 30 years of age or older, have your blood pressure checked every year. You should have your blood pressure measured twice-once when you are at a hospital or clinic, and once when you are not at a hospital or clinic. Record the average of the two measurements. To check your blood pressure when you are not at a hospital or clinic, you can use: ? An automated blood pressure machine at a pharmacy. ? A home blood pressure monitor.  If you are between 65 years and 63 years old, ask your health care provider if you should take aspirin to prevent strokes.  Have regular diabetes screenings. This involves taking a blood sample to check your fasting blood sugar level. ? If you are at a normal weight and  have a low risk for diabetes, have this test once every three years after 68 years of age. ? If you are overweight and have a high risk for diabetes, consider being tested at a younger age or more often. Preventing infection Hepatitis B  If you have a higher risk for hepatitis B, you should be screened for this virus. You are considered at high risk for hepatitis B if: ? You were born in a country where hepatitis B is common. Ask your health care provider which countries are considered high risk. ? Your parents were born in a high-risk country, and you have not been immunized against hepatitis B (hepatitis B vaccine). ? You have HIV or AIDS. ? You use needles to inject street drugs. ? You live with someone who has hepatitis B. ? You have had sex with someone who has hepatitis B. ? You get hemodialysis treatment. ? You take certain medicines for conditions, including cancer, organ transplantation, and autoimmune conditions.  Hepatitis C  Blood testing is recommended for: ? Everyone born from [birth year] through 1966. ? Anyone with known risk factors for hepatitis C.  Sexually transmitted infections (STIs)  You should be screened for sexually transmitted infections (STIs) including gonorrhea and chlamydia if: ? You are sexually active and are younger than 68 years of age. ? You are older than 68 years of age and your health care provider tells you that you are at risk for this type of infection. ? Your sexual activity has changed since you were last screened and you are at an increased risk for chlamydia or gonorrhea. Ask your health care provider if you are at risk.  If you do not have HIV, but are at risk, it may be recommended that you take a prescription medicine daily to prevent HIV infection. This is called pre-exposure prophylaxis (PrEP). You are considered at risk if: ? You are sexually active and do not regularly use condoms or know the HIV status of your partner(s). ? You take drugs by  injection. ? You are sexually active with a partner who has HIV.  Talk with your health care provider about whether you are at high risk of being infected with HIV. If you choose to begin PrEP, you should first be tested for HIV. You should then be tested every 3 months for as long as you are taking PrEP. Pregnancy  If you are premenopausal and you may become pregnant, ask your health care provider  about preconception counseling.  If you may become pregnant, take 400 to 800 micrograms (mcg) of folic acid every day.  If you want to prevent pregnancy, talk to your health care provider about birth control (contraception). Osteoporosis and menopause  Osteoporosis is a disease in which the bones lose minerals and strength with aging. This can result in serious bone fractures. Your risk for osteoporosis can be identified using a bone density scan.  If you are 6 years of age or older, or if you are at risk for osteoporosis and fractures, ask your health care provider if you should be screened.  Ask your health care provider whether you should take a calcium or vitamin D supplement to lower your risk for osteoporosis.  Menopause may have certain physical symptoms and risks.  Hormone replacement therapy may reduce some of these symptoms and risks. Talk to your health care provider about whether hormone replacement therapy is right for you. Follow these instructions at home:  Schedule regular health, dental, and eye exams.  Stay current with your immunizations.  Do not use any tobacco products including cigarettes, chewing tobacco, or electronic cigarettes.  If you are pregnant, do not drink alcohol.  If you are breastfeeding, limit how much and how often you drink alcohol.  Limit alcohol intake to no more than 1 drink per day for nonpregnant women. One drink equals 12 ounces of beer, 5 ounces of wine, or 1 ounces of hard liquor.  Do not use street drugs.  Do not share needles.  Ask  your health care provider for help if you need support or information about quitting drugs.  Tell your health care provider if you often feel depressed.  Tell your health care provider if you have ever been abused or do not feel safe at home. This information is not intended to replace advice given to you by your health care provider. Make sure you discuss any questions you have with your health care provider. Document Released: 04/05/2011 Document Revised: 02/26/2016 Document Reviewed: 06/24/2015 Elsevier Interactive Patient Education  [employer].

## 2018-05-11 NOTE — Progress Notes (Signed)
Subjective:    Patient ID: Jessica Montoya, female    DOB: 11-Oct-1950, 67 y.o.   MRN: [id number]  Jessica Montoya is a 67 y.o. female presenting on 05/11/2018 for Annual Exam   HPI   DM II: A1c today is 12.9. Currently taking Lantus 90 units at bed time and Humalog 30 units before meals three times daily, managed by Dr. [name]. Previously took metformin but had intolerable diarrhea. Eye Exam done at [facility].   HTN: Ramipril 2.5 mg daily.  HLD: Simvastatin 40 mg daily.   Sleep: trazodone and amitriptyline  Depression: 20 mg prozac  Gabapentin: 300 mg BID from knee pain  Colon Cancer: had right hemicolectomy for early stage colon cancer. Reports she did not need chemotherapy or radiation. Oncologist has since retired. Has not had colonoscopy since 2014 and has not been followed by oncology.    Colonoscopy: 2014 Mammogram: DUE PAP: Not indicated.  DEXA: Due   Social History   Tobacco Use  . Smoking status: Never Smoker  . Smokeless tobacco: Never Used  Substance Use Topics  . Alcohol use: No    Alcohol/week: 0.0 standard drinks  . Drug use: No    Review of Systems Per HPI unless specifically indicated above     Objective:    BP 130/74   Pulse (!) 104   Ht 5' 6"  (1.676 m)   Wt 154 lb (69.9 kg)   LMP  (LMP Unknown)   SpO2 98%   BMI 24.86 kg/m   Wt Readings from Last 3 Encounters:  05/16/18 153 lb 8 oz (69.6 kg)  05/11/18 154 lb (69.9 kg)  03/01/18 154 lb (69.9 kg)    Physical Exam  Constitutional: She is oriented to person, place, and time. She appears well-developed and well-nourished.  Cardiovascular: Normal rate and regular rhythm.  Pulmonary/Chest: Effort normal and breath sounds normal.  Abdominal: Soft. Bowel sounds are normal.  Neurological: She is alert and oriented to person, place, and time.  Skin: Skin is warm and dry.  Psychiatric: She has a normal mood and affect. Her behavior is normal.     Diabetic Foot Exam - Simple     Simple Foot Form Diabetic Foot exam was performed with the following findings:  Yes 05/11/2018  2:26 PM  Visual Inspection Sensation Testing See comments:  Yes Pulse Check Posterior Tibialis and Dorsalis pulse intact bilaterally:  Yes Comments Decreased sensation in lower extremities bilaterally.      Results for orders placed or performed in visit on 05/11/18  Bayer DCA Hb A1c Waived (STAT)  Result Value Ref Range   HB A1C (BAYER DCA - WAIVED) 12.9 (H) <7.0 %  Comp Met (CMET)  Result Value Ref Range   Glucose 545 (HH) 65 - 99 mg/dL   BUN 16 8 - 27 mg/dL   Creatinine, Ser 1.41 (H) 0.57 - 1.00 mg/dL   GFR calc non Af Amer 39 (L) >59 mL/min/1.73   GFR calc Af Amer 44 (L) >59 mL/min/1.73   BUN/Creatinine Ratio 11 (L) 12 - 28   Sodium 132 (L) 134 - 144 mmol/L   Potassium 4.9 3.5 - 5.2 mmol/L   Chloride 94 (L) 96 - 106 mmol/L   CO2 20 20 - 29 mmol/L   Calcium 9.7 8.7 - 10.3 mg/dL   Total Protein 6.8 6.0 - 8.5 g/dL   Albumin 4.2 3.6 - 4.8 g/dL   Globulin, Total 2.6 1.5 - 4.5 g/dL   Albumin/Globulin Ratio 1.6 1.2 -  2.2   Bilirubin Total 0.5 0.0 - 1.2 mg/dL   Alkaline Phosphatase 68 39 - 117 IU/L   AST 15 0 - 40 IU/L   ALT 13 0 - 32 IU/L      Assessment & Plan:  1. Annual physical exam   2. Essential hypertension  Controlled, continue current medications.  - Bayer DCA Hb A1c Waived (STAT) - Comp Met (CMET)  3. Type II diabetes mellitus with neurological manifestations (HCC)  Uncontrolled, followed by Dr. [name].  - Bayer DCA Hb A1c Waived (STAT) - Comp Met (CMET)  4. Breast cancer screening  - MM Digital Screening; Future  5. History of colon cancer  - Ambulatory referral to Gastroenterology  6. Uncontrolled type 2 diabetes mellitus with chronic kidney disease ([place])   7. Mixed hyperlipidemia  Controlled, continue simvastatin.   Follow up plan: Return in about 3 months (around 08/11/2018) for chronic .  Carles Collet, PA-C [facility] 05/18/2018, 4:50 PM

## 2018-05-12 ENCOUNTER — Other Ambulatory Visit: Payer: Self-pay

## 2018-05-12 DIAGNOSIS — Z85038 Personal history of other malignant neoplasm of large intestine: Secondary | ICD-10-CM

## 2018-05-12 LAB — COMPREHENSIVE METABOLIC PANEL
ALT: 13 IU/L (ref 0–32)
AST: 15 IU/L (ref 0–40)
Albumin/Globulin Ratio: 1.6 (ref 1.2–2.2)
Albumin: 4.2 g/dL (ref 3.6–4.8)
Alkaline Phosphatase: 68 IU/L (ref 39–117)
BUN/Creatinine Ratio: 11 — ABNORMAL LOW (ref 12–28)
BUN: 16 mg/dL (ref 8–27)
Bilirubin Total: 0.5 mg/dL (ref 0.0–1.2)
CO2: 20 mmol/L (ref 20–29)
Calcium: 9.7 mg/dL (ref 8.7–10.3)
Chloride: 94 mmol/L — ABNORMAL LOW (ref 96–106)
Creatinine, Ser: 1.41 mg/dL — ABNORMAL HIGH (ref 0.57–1.00)
GFR calc Af Amer: 44 mL/min/{1.73_m2} — ABNORMAL LOW (ref >59)
GFR calc non Af Amer: 39 mL/min/{1.73_m2} — ABNORMAL LOW (ref >59)
Globulin, Total: 2.6 g/dL (ref 1.5–4.5)
Glucose: 545 mg/dL (ref 65–99)
Potassium: 4.9 mmol/L (ref 3.5–5.2)
Sodium: 132 mmol/L — ABNORMAL LOW (ref 134–144)
Total Protein: 6.8 g/dL (ref 6.0–8.5)

## 2018-05-16 ENCOUNTER — Encounter

## 2018-05-16 ENCOUNTER — Ambulatory Visit: Payer: Medicare Other | Admitting: Cardiovascular Disease

## 2018-05-16 ENCOUNTER — Encounter: Payer: Self-pay | Admitting: Cardiovascular Disease

## 2018-05-16 VITALS — BP 98/50 | HR 102 | Ht 66.0 in | Wt 153.5 lb

## 2018-05-16 DIAGNOSIS — E782 Mixed hyperlipidemia: Secondary | ICD-10-CM | POA: Diagnosis not present

## 2018-05-16 DIAGNOSIS — R079 Chest pain, unspecified: Secondary | ICD-10-CM

## 2018-05-16 DIAGNOSIS — R0602 Shortness of breath: Secondary | ICD-10-CM

## 2018-05-16 NOTE — Progress Notes (Signed)
Cardiology Office Note   Date:  05/16/2018   ID:  Jessica Montoya, DOB [birth year], MRN [id number]  PCP:  Kathrine Haddock, NP  Cardiologist:   Kathlyn Sacramento, MD   Chief Complaint  Patient presents with  . New Patient (Initial Visit)    referred by Kathrine Haddock dizziness, fatigue, leg pain, and weakness. Meds reviewed verbally with patient.       History of Present Illness: Jessica Montoya is a 67 y.o. female who was referred by Kathrine Haddock for evaluation of shortness of breath, chest pain and fatigue. She has multiple chronic medical conditions including type 2 diabetes, hyperlipidemia, hypertension, peripheral neuropathy and chronic kidney disease.  She had previous colon cancer in 2012 treated successfully with colectomy. Over the last 6 months, she has experienced extreme exertional fatigue and shortness of breath.  She also reports episodes of chest tightness that can happen both at rest and with physical activities.  She feels that she has no energy at all.  The symptoms reminded her of when she was diagnosed with colon cancer in the past.  She also describes balance problems and stumbling when she walks.  She has mild dizziness but no recent syncope or presyncope. Her diabetes has been difficult to control with a recent hemoglobin A1c above 12 and blood sugar above 500.  She had diabetes for at least 20 years. She reports having cardiac work-up done 15 to 20 years ago in [place] which was unremarkable.  No recent cardiac work-up.   Past Medical History:  Diagnosis Date  . Anxiety   . Chronic kidney disease   . Chronic pain   . Depression   . Diabetes mellitus without complication ([place])   . Diabetic neuropathy ([place])   . Hyperlipidemia   . Hypertension   . Insomnia   . Long term current use of insulin ([place])   . Peripheral neuropathy     Past Surgical History:  Procedure Laterality Date  . ABDOMINAL HYSTERECTOMY    . COLON SURGERY       Current  Outpatient Medications  Medication Sig Dispense Refill  . amitriptyline (ELAVIL) 10 MG tablet TAKE 2 TABLETS (20 MG TOTAL) BY MOUTH AT BEDTIME AS NEEDED FOR SLEEP. 180 tablet 1  . aspirin 81 MG tablet Take 81 mg by mouth daily.    . Cholecalciferol (VITAMIN D PO) Take by mouth.    . ferrous fumarate (HEMOCYTE - 106 MG FE) 325 (106 FE) MG TABS tablet Take 1 tablet by mouth daily.    [name] FLUoxetine (PROZAC) 20 MG capsule Take 1 capsule (20 mg total) by mouth daily. 30 capsule 0  . gabapentin (NEURONTIN) 300 MG capsule Take 1 capsule (300 mg total) by mouth 2 (two) times daily. 60 capsule 1  . Insulin Glargine (LANTUS SOLOSTAR) 100 UNIT/ML Solostar Pen Inject 90 Units into the skin at bedtime.    . insulin lispro (HUMALOG KWIKPEN) 100 UNIT/ML KiwkPen Take 26 units three times daily before meals    . meloxicam (MOBIC) 15 MG tablet Take 15 mg by mouth daily.  3  . Multiple Vitamin (MULTIVITAMIN) tablet Take 1 tablet by mouth daily.    [name] omeprazole (PRILOSEC) 20 MG capsule TAKE 1 CAPSULE BY MOUTH  DAILY 90 capsule 1  . Probiotic Product (PROBIOTIC PO) Take 1 tablet by mouth daily.    . ramipril (ALTACE) 2.5 MG capsule TAKE 1 CAPSULE (2.5 MG TOTAL) BY MOUTH DAILY. 30 capsule 0  . simvastatin (ZOCOR) 40 MG  tablet Take 1 tablet (40 mg total) by mouth daily. 30 tablet 0  . traZODone (DESYREL) 50 MG tablet TAKE 0.5-1 TABLETS (25-50 MG TOTAL) BY MOUTH AT BEDTIME AS NEEDED FOR SLEEP. 90 tablet 2   No current facility-administered medications for this visit.     Allergies:   Exenatide; Insulin aspart; Other; and Compazine [prochlorperazine edisylate]    Social History:  The patient  reports that she has never smoked. She has never used smokeless tobacco. She reports that she does not drink alcohol or use drugs.   Family History:  The patient's family history includes Cancer in her father, maternal grandfather, maternal grandmother, mother, and paternal grandmother; Hypertension in her maternal grandmother;  Stroke in her paternal grandfather.    ROS:  Please see the history of present illness.   Otherwise, review of systems are positive for none.   All other systems are reviewed and negative.    PHYSICAL EXAM: VS:  BP (!) 98/50 (BP Location: Left Arm, Patient Position: Sitting, Cuff Size: Normal)   Pulse (!) 102   Ht 5\' 6"  (1.676 m)   Wt 153 lb 8 oz (69.6 kg)   LMP  (LMP Unknown)   BMI 24.78 kg/m  , BMI Body mass index is 24.78 kg/m. GEN: Well nourished, well developed, in no acute distress  HEENT: normal  Neck: no JVD, carotid bruits, or masses Cardiac: RRR; no murmurs, rubs, or gallops,no edema  Respiratory:  clear to auscultation bilaterally, normal work of breathing GI: soft, nontender, nondistended, + BS MS: no deformity or atrophy  Skin: warm and dry, no rash Neuro:  Strength and sensation are intact Psych: euthymic mood, full affect   EKG:  EKG is ordered today. The ekg ordered today demonstrates sinus tachycardia with no significant ST or T wave changes.  Heart rate is 102 bpm.   Recent Labs: 03/01/2018: Hemoglobin 13.0; Platelets 201; TSH 1.460 05/11/2018: ALT 13; BUN 16; Creatinine, Ser 1.41; Potassium 4.9; Sodium 132    Lipid Panel    Component Value Date/Time   CHOL 163 09/28/2017 1411   CHOL 125 05/21/2015 1500   TRIG 252 (H) 09/28/2017 1411   TRIG 225 (H) 05/21/2015 1500   HDL 52 09/28/2017 1411   VLDL 45 (H) 05/21/2015 1500   LDLCALC 61 09/28/2017 1411      Wt Readings from Last 3 Encounters:  05/16/18 153 lb 8 oz (69.6 kg)  05/11/18 154 lb (69.9 kg)  03/01/18 154 lb (69.9 kg)      Other studies Reviewed: Additional studies/ records that were reviewed today include: Recent labs. Review of the above records demonstrates: Outlined above  No flowsheet data found.    ASSESSMENT AND PLAN:  1.  Exertional dyspnea on chest pain: The patient's symptoms are definitely concerning especially with underlying uncontrolled diabetes.  She is at high risk  for ischemic heart disease.  Thus, I requested a Lexiscan Myoview.  I also requested an echocardiogram to evaluate diastolic function and pulmonary pressure.  She is not able to exercise on a treadmill to do a treadmill stress test.  2.  Orthostatic dizziness: Blood pressure did decrease from 112-94 from lying to standing position.  I advised her to stay well-hydrated.  She is on small dose ramipril likely for renal protection from diabetes.  3.  Hyperlipidemia: I agree with treatment with a statin with a target LDL of less than 70 given diabetes.  4.  Diabetes mellitus: Followed by endocrinology.  Unfortunately, this has not been  well controlled.    Disposition:   FU with me in 6 weeks  Signed,  Kathlyn Sacramento, MD  05/16/2018 2:00 PM    [place]

## 2018-05-16 NOTE — Patient Instructions (Signed)
Medication Instructions: Your physician recommends that you continue on your current medications as directed. Please refer to the Current Medication list given to you today.  If you need a refill on your cardiac medications before your next appointment, please call your pharmacy.   Procedures/Testing: Your physician has requested that you have an echocardiogram. Echocardiography is a painless test that uses sound waves to create images of your heart. It provides your doctor with information about the size and shape of your heart and how well your heart's chambers and valves are working. You may receive an ultrasound enhancing agent through an IV if needed to better visualize your heart during the echo.This procedure takes approximately one hour. There are no restrictions for this procedure. This will take place at the [facility] clinic.   Your physician has requested that you have a lexiscan myoview. For further information please visit [email or web address]. Please follow instruction sheet, as given.   Follow-Up: Your physician wants you to follow-up in 6 weeks with Dr. Fletcher Anon.   Thank you for choosing [facility] at [facility]!     [place]  Your provider has ordered a Stress Test with nuclear imaging. The purpose of this test is to evaluate the blood supply to your heart muscle. This procedure is referred to as a "Non-Invasive Stress Test." This is because other than having an IV started in your vein, nothing is inserted or "invades" your body. Cardiac stress tests are done to find areas of poor blood flow to the heart by determining the extent of coronary artery disease (CAD). Some patients exercise on a treadmill, which naturally increases the blood flow to your heart, while others who are unable to walk on a treadmill due to physical limitations have a pharmacologic/chemical stress agent called Lexiscan . This medicine will mimic walking on a treadmill by temporarily increasing  your coronary blood flow.   Please note: these test may take anywhere between 2-4 hours to complete  PLEASE REPORT TO [place] AT THE FIRST DESK WILL DIRECT YOU WHERE TO GO  Date of Procedure:_____________________________________  Arrival Time for Procedure:______________________________  Instructions regarding medication:   __X__ : Hold diabetes medication morning of procedure   PLEASE NOTIFY THE OFFICE AT LEAST 24 HOURS IN ADVANCE IF YOU ARE UNABLE TO KEEP YOUR APPOINTMENT.  [phone] AND  PLEASE NOTIFY NUCLEAR MEDICINE AT [facility] AT LEAST 24 HOURS IN ADVANCE IF YOU ARE UNABLE TO KEEP YOUR APPOINTMENT. [phone]  How to prepare for your Myoview test:  1. Do not eat or drink after midnight 2. No caffeine for 24 hours prior to test 3. No smoking 24 hours prior to test. 4. Your medication may be taken with water.  If your doctor stopped a medication because of this test, do not take that medication. 5. Ladies, please do not wear dresses.  Skirts or pants are appropriate. Please wear a short sleeve shirt. 6. No perfume, cologne or lotion. 7. Wear comfortable walking shoes. No heels!

## 2018-05-18 ENCOUNTER — Telehealth: Payer: Self-pay

## 2018-05-18 ENCOUNTER — Other Ambulatory Visit: Payer: Self-pay | Admitting: Unknown Physician Specialty

## 2018-05-18 NOTE — Telephone Encounter (Signed)
Contacted patient to request rescheduling her colonoscopy date due to change in scope schedule (Dr. Verlin Grills not in town).  Patient has agreed to reschedule but will call back at a later time due to cardiology testing at this time.  Thanks [employer]

## 2018-05-18 NOTE — Telephone Encounter (Signed)
Refill request for Fluoxetine 20 mg. Last refill 09/28/17; #30; 0 RF PCP: Carles Collet Last OV: 05/11/18  Called pt. To inquire if she is still taking this medication, due to refill history.  Confirmed she is still taking this medication.  Stated she has been getting refills through [facility] and explained the refill in December 2018 was a one month supply until mail order arrived.     Will refill per protocol

## 2018-05-19 ENCOUNTER — Other Ambulatory Visit: Payer: Self-pay

## 2018-05-19 ENCOUNTER — Ambulatory Visit (INDEPENDENT_AMBULATORY_CARE_PROVIDER_SITE_OTHER): Payer: Medicare Other

## 2018-05-19 DIAGNOSIS — R0602 Shortness of breath: Secondary | ICD-10-CM

## 2018-05-19 DIAGNOSIS — R079 Chest pain, unspecified: Secondary | ICD-10-CM

## 2018-05-23 ENCOUNTER — Telehealth: Payer: Self-pay

## 2018-05-23 NOTE — Telephone Encounter (Signed)
Dr. Fletcher Anon,  Patient calling for echo results.  Please review. Myoview scheduled for 05/29/18.   Thanks!

## 2018-05-23 NOTE — Telephone Encounter (Signed)
Patient would like results of Echo.  Please call.

## 2018-05-24 NOTE — Telephone Encounter (Signed)
Patient made aware of results and verbalized understanding.  Notes recorded by Wellington Hampshire, MD on 05/23/2018 at 5:57 PM EDT Inform patient that echo was fine. No significant abnormalities.

## 2018-05-25 ENCOUNTER — Ambulatory Visit: Admission: RE | Admit: 2018-05-25 | Payer: Medicare Other | Source: Ambulatory Visit | Admitting: Gastroenterology

## 2018-05-25 ENCOUNTER — Encounter: Admission: RE | Payer: Self-pay | Source: Ambulatory Visit

## 2018-05-25 SURGERY — COLONOSCOPY WITH PROPOFOL
Anesthesia: General

## 2018-05-29 ENCOUNTER — Encounter
Admission: RE | Admit: 2018-05-29 | Discharge: 2018-05-29 | Disposition: A | Discharge status: Home / Self Care | Payer: Medicare Other | Source: Ambulatory Visit | Attending: Cardiovascular Disease | Admitting: Cardiovascular Disease

## 2018-05-29 DIAGNOSIS — R079 Chest pain, unspecified: Secondary | ICD-10-CM | POA: Insufficient documentation

## 2018-05-29 LAB — NM MYOCAR MULTI W/SPECT W/WALL MOTION / EF
CHL CUP NUCLEAR SDS: 0
CHL CUP NUCLEAR SRS: 0
LVDIAVOL: 11 mL (ref 46–106)
LVSYSVOL: 1 mL
NUC STRESS TID: 0.54
Peak HR: 109 {beats}/min
Percent HR: 71 %
Rest HR: 91 {beats}/min
SSS: 0

## 2018-05-29 MED ORDER — TECHNETIUM TC 99M TETROFOSMIN IV KIT
30.0000 | PACK | Freq: Once | INTRAVENOUS | Status: AC | PRN
  Administered 2018-05-29: 29.57 via INTRAVENOUS

## 2018-05-29 MED ORDER — TECHNETIUM TC 99M TETROFOSMIN IV KIT
12.7400 | PACK | Freq: Once | INTRAVENOUS | Status: AC | PRN
  Administered 2018-05-29: 12.74 via INTRAVENOUS

## 2018-05-29 MED ORDER — REGADENOSON 0.4 MG/5ML IV SOLN
0.4000 mg | Freq: Once | INTRAVENOUS | Status: AC
  Administered 2018-05-29: 0.4 mg via INTRAVENOUS
  Filled 2018-05-29: qty 5

## 2018-06-01 ENCOUNTER — Ambulatory Visit (INDEPENDENT_AMBULATORY_CARE_PROVIDER_SITE_OTHER): Payer: Medicare Other

## 2018-06-01 VITALS — BP 116/66 | HR 107 | Temp 97.6°F | Resp 16 | Ht 65.5 in | Wt 154.2 lb

## 2018-06-01 DIAGNOSIS — Z0000 Encounter for general adult medical examination without abnormal findings: Secondary | ICD-10-CM

## 2018-06-01 NOTE — Patient Instructions (Signed)
Jessica Montoya , Thank you for taking time to come for your Medicare Wellness Visit. I appreciate your ongoing commitment to your health goals. Please review the following plan we discussed and let me know if I can assist you in the future.   Screening recommendations/referrals: Colonoscopy: completed 09/11/2013 Mammogram: ordered Please call [phone] to schedule your mammogram.  Bone Density: completed 10/11/2017 Recommended yearly ophthalmology/optometry visit for glaucoma screening and checkup Recommended yearly dental visit for hygiene and checkup  Vaccinations: Influenza vaccine: due now- declined  Pneumococcal vaccine: completed series Tdap vaccine: due, check with your insurance company for coverage  Shingles vaccine: shingrix eligible, check with your insurance company for coverage     Advanced directives: Advance directive discussed with you today. I have provided a copy for you to complete at home and have notarized. Once this is complete please bring a copy in to our office so we can scan it into your chart.  Conditions/risks identified: Recommend drinking at least 6-8 glasses of water a day   Next appointment: Follow up in one year for your annual wellness visit.    Preventive Care 7 Years and Older, Female Preventive care refers to lifestyle choices and visits with your health care provider that can promote health and wellness. What does preventive care include?  A yearly physical exam. This is also called an annual well check.  Dental exams once or twice a year.  Routine eye exams. Ask your health care provider how often you should have your eyes checked.  Personal lifestyle choices, including:  Daily care of your teeth and gums.  Regular physical activity.  Eating a healthy diet.  Avoiding tobacco and drug use.  Limiting alcohol use.  Practicing safe sex.  Taking low-dose aspirin every day.  Taking vitamin and mineral supplements as recommended by your  health care provider. What happens during an annual well check? The services and screenings done by your health care provider during your annual well check will depend on your age, overall health, lifestyle risk factors, and family history of disease. Counseling  Your health care provider may ask you questions about your:  Alcohol use.  Tobacco use.  Drug use.  Emotional well-being.  Home and relationship well-being.  Sexual activity.  Eating habits.  History of falls.  Memory and ability to understand (cognition).  Work and work Statistician.  Reproductive health. Screening  You may have the following tests or measurements:  Height, weight, and BMI.  Blood pressure.  Lipid and cholesterol levels. These may be checked every 5 years, or more frequently if you are over 101 years old.  Skin check.  Lung cancer screening. You may have this screening every year starting at age 39 if you have a 30-pack-year history of smoking and currently smoke or have quit within the past 15 years.  Fecal occult blood test (FOBT) of the stool. You may have this test every year starting at age 60.  Flexible sigmoidoscopy or colonoscopy. You may have a sigmoidoscopy every 5 years or a colonoscopy every 10 years starting at age 79.  Hepatitis C blood test.  Hepatitis B blood test.  Sexually transmitted disease (STD) testing.  Diabetes screening. This is done by checking your blood sugar (glucose) after you have not eaten for a while (fasting). You may have this done every 1-3 years.  Bone density scan. This is done to screen for osteoporosis. You may have this done starting at age 41.  Mammogram. This may be done every 1-2  years. Talk to your health care provider about how often you should have regular mammograms. Talk with your health care provider about your test results, treatment options, and if necessary, the need for more tests. Vaccines  Your health care provider may recommend  certain vaccines, such as:  Influenza vaccine. This is recommended every year.  Tetanus, diphtheria, and acellular pertussis (Tdap, Td) vaccine. You may need a Td booster every 10 years.  Zoster vaccine. You may need this after age 59.  Pneumococcal 13-valent conjugate (PCV13) vaccine. One dose is recommended after age 12.  Pneumococcal polysaccharide (PPSV23) vaccine. One dose is recommended after age 54. Talk to your health care provider about which screenings and vaccines you need and how often you need them. This information is not intended to replace advice given to you by your health care provider. Make sure you discuss any questions you have with your health care provider. Document Released: 10/17/2015 Document Revised: 06/09/2016 Document Reviewed: 07/22/2015 Elsevier Interactive Patient Education  2017 [employer] Prevention in the Home Falls can cause injuries. They can happen to people of all ages. There are many things you can do to make your home safe and to help prevent falls. What can I do on the outside of my home?  Regularly fix the edges of walkways and driveways and fix any cracks.  Remove anything that might make you trip as you walk through a door, such as a raised step or threshold.  Trim any bushes or trees on the path to your home.  Use bright outdoor lighting.  Clear any walking paths of anything that might make someone trip, such as rocks or tools.  Regularly check to see if handrails are loose or broken. Make sure that both sides of any steps have handrails.  Any raised decks and porches should have guardrails on the edges.  Have any leaves, snow, or ice cleared regularly.  Use sand or salt on walking paths during winter.  Clean up any spills in your garage right away. This includes oil or grease spills. What can I do in the bathroom?  Use night lights.  Install grab bars by the toilet and in the tub and shower. Do not use towel bars as  grab bars.  Use non-skid mats or decals in the tub or shower.  If you need to sit down in the shower, use a plastic, non-slip stool.  Keep the floor dry. Clean up any water that spills on the floor as soon as it happens.  Remove soap buildup in the tub or shower regularly.  Attach bath mats securely with double-sided non-slip rug tape.  Do not have throw rugs and other things on the floor that can make you trip. What can I do in the bedroom?  Use night lights.  Make sure that you have a light by your bed that is easy to reach.  Do not use any sheets or blankets that are too big for your bed. They should not hang down onto the floor.  Have a firm chair that has side arms. You can use this for support while you get dressed.  Do not have throw rugs and other things on the floor that can make you trip. What can I do in the kitchen?  Clean up any spills right away.  Avoid walking on wet floors.  Keep items that you use a lot in easy-to-reach places.  If you need to reach something above you, use a strong step  stool that has a grab bar.  Keep electrical cords out of the way.  Do not use floor polish or wax that makes floors slippery. If you must use wax, use non-skid floor wax.  Do not have throw rugs and other things on the floor that can make you trip. What can I do with my stairs?  Do not leave any items on the stairs.  Make sure that there are handrails on both sides of the stairs and use them. Fix handrails that are broken or loose. Make sure that handrails are as long as the stairways.  Check any carpeting to make sure that it is firmly attached to the stairs. Fix any carpet that is loose or worn.  Avoid having throw rugs at the top or bottom of the stairs. If you do have throw rugs, attach them to the floor with carpet tape.  Make sure that you have a light switch at the top of the stairs and the bottom of the stairs. If you do not have them, ask someone to add them  for you. What else can I do to help prevent falls?  Wear shoes that:  Do not have high heels.  Have rubber bottoms.  Are comfortable and fit you well.  Are closed at the toe. Do not wear sandals.  If you use a stepladder:  Make sure that it is fully opened. Do not climb a closed stepladder.  Make sure that both sides of the stepladder are locked into place.  Ask someone to hold it for you, if possible.  Clearly mark and make sure that you can see:  Any grab bars or handrails.  First and last steps.  Where the edge of each step is.  Use tools that help you move around (mobility aids) if they are needed. These include:  Canes.  Walkers.  Scooters.  Crutches.  Turn on the lights when you go into a dark area. Replace any light bulbs as soon as they burn out.  Set up your furniture so you have a clear path. Avoid moving your furniture around.  If any of your floors are uneven, fix them.  If there are any pets around you, be aware of where they are.  Review your medicines with your doctor. Some medicines can make you feel dizzy. This can increase your chance of falling. Ask your doctor what other things that you can do to help prevent falls. This information is not intended to replace advice given to you by your health care provider. Make sure you discuss any questions you have with your health care provider. Document Released: 07/17/2009 Document Revised: 02/26/2016 Document Reviewed: 10/25/2014 Elsevier Interactive Patient Education  2017 [employer].

## 2018-06-01 NOTE — Progress Notes (Signed)
Subjective:   Jessica Montoya is a 67 y.o. female who presents for Medicare Annual (Subsequent) preventive examination.  Review of Systems:   Cardiac Risk Factors include: hypertension;advanced age (>60men, >21 women);dyslipidemia     Objective:     Vitals: BP 116/66 (BP Location: Left Arm, Patient Position: Sitting)   Pulse (!) 107   Temp 97.6 F (36.4 C) (Temporal)   Resp 16   Ht 5' 5.5" (1.664 m)   Wt 154 lb 3.2 oz (69.9 kg)   LMP  (LMP Unknown)   SpO2 98%   BMI 25.27 kg/m   Body mass index is 25.27 kg/m.  Advanced Directives 06/01/2018 04/18/2017 08/06/2015 08/06/2015  Does Patient Have a Medical Advance Directive? No No No No  Would patient like information on creating a medical advance directive? Yes (MAU/Ambulatory/Procedural Areas - Information given) - Yes - Educational materials given -    Tobacco Social History   Tobacco Use  Smoking Status Never Smoker  Smokeless Tobacco Never Used     Counseling given: Not Answered   Clinical Intake:  Pre-visit preparation completed: No  Pain : No/denies pain Pain Score: 0-No pain     Nutritional Status: BMI 25 -29 Overweight Diabetes: Yes CBG done?: No Did pt. bring in CBG monitor from home?: No  How often do you need to have someone help you when you read instructions, pamphlets, or other written materials from your doctor or pharmacy?: 1 - Never What is the last grade level you completed in school?: high school   Interpreter Needed?: No  Information entered by :: Ashyr Hedgepath,LPN   Past Medical History:  Diagnosis Date  . Anxiety   . Chronic kidney disease   . Chronic pain   . Depression   . Diabetes mellitus without complication ([place])   . Diabetic neuropathy ([place])   . Hyperlipidemia   . Hypertension   . Insomnia   . Long term current use of insulin ([place])   . Peripheral neuropathy    Past Surgical History:  Procedure Laterality Date  . ABDOMINAL HYSTERECTOMY    . COLON SURGERY      Family History  Problem Relation Age of Onset  . Cancer Mother        melanoma  . Cancer Father        pancreatic  . Hypertension Maternal Grandmother   . Cancer Maternal Grandmother        colon  . Cancer Paternal Grandmother        stomach  . Stroke Paternal Grandfather   . Cancer Maternal Grandfather        kidney   Social History   Socioeconomic History  . Marital status: Married    Spouse name: Not on file  . Number of children: Not on file  . Years of education: Not on file  . Highest education level: High school graduate  Occupational History  . Not on file  Social Needs  . Financial resource strain: Not hard at all  . Food insecurity:    Worry: Never true    Inability: Never true  . Transportation needs:    Medical: No    Non-medical: No  Tobacco Use  . Smoking status: Never Smoker  . Smokeless tobacco: Never Used  Substance and Sexual Activity  . Alcohol use: No    Alcohol/week: 0.0 standard drinks  . Drug use: No  . Sexual activity: Yes  Lifestyle  . Physical activity:    Days per week: 0  days    Minutes per session: 0 min  . Stress: Not at all  Relationships  . Social connections:    Talks on phone: More than three times a week    Gets together: More than three times a week    Attends religious service: Never    Active member of club or organization: No    Attends meetings of clubs or organizations: Never    Relationship status: Married  Other Topics Concern  . Not on file  Social History Narrative  . Not on file    Outpatient Encounter Medications as of 06/01/2018  Medication Sig  . amitriptyline (ELAVIL) 10 MG tablet TAKE 2 TABLETS (20 MG TOTAL) BY MOUTH AT BEDTIME AS NEEDED FOR SLEEP.  [name] aspirin 81 MG tablet Take 81 mg by mouth daily.  . Cholecalciferol (VITAMIN D PO) Take by mouth.  . ferrous fumarate (HEMOCYTE - 106 MG FE) 325 (106 FE) MG TABS tablet Take 1 tablet by mouth daily.  [name] FLUoxetine (PROZAC) 20 MG capsule TAKE 1 CAPSULE  BY MOUTH EVERY DAY  . gabapentin (NEURONTIN) 300 MG capsule Take 1 capsule (300 mg total) by mouth 2 (two) times daily.  . Insulin Glargine (LANTUS SOLOSTAR) 100 UNIT/ML Solostar Pen Inject 90 Units into the skin at bedtime.  . insulin lispro (HUMALOG KWIKPEN) 100 UNIT/ML KiwkPen Take 26 units three times daily before meals  . meloxicam (MOBIC) 15 MG tablet Take 15 mg by mouth daily.  . Multiple Vitamin (MULTIVITAMIN) tablet Take 1 tablet by mouth daily.  [name] omeprazole (PRILOSEC) 20 MG capsule TAKE 1 CAPSULE BY MOUTH  DAILY  . Probiotic Product (PROBIOTIC PO) Take 1 tablet by mouth daily.  . ramipril (ALTACE) 2.5 MG capsule TAKE 1 CAPSULE (2.5 MG TOTAL) BY MOUTH DAILY.  . simvastatin (ZOCOR) 40 MG tablet Take 1 tablet (40 mg total) by mouth daily.  . traZODone (DESYREL) 50 MG tablet TAKE 0.5-1 TABLETS (25-50 MG TOTAL) BY MOUTH AT BEDTIME AS NEEDED FOR SLEEP.   No facility-administered encounter medications on file as of 06/01/2018.     Activities of Daily Living In your present state of health, do you have any difficulty performing the following activities: 06/01/2018  Hearing? N  Comment some difficulty in left ear   Vision? Y  Comment reading glasses  Difficulty concentrating or making decisions? N  Walking or climbing stairs? Y  Comment dizziness   Dressing or bathing? N  Doing errands, shopping? N  Preparing Food and eating ? N  Using the Toilet? N  In the past six months, have you accidently leaked urine? N  Do you have problems with loss of bowel control? N  Managing your Medications? N  Managing your Finances? N  Housekeeping or managing your Housekeeping? N  Some recent data might be hidden    Patient Care Team: Valerie Roys, DO as PCP - General (Family Medicine) Vladimir Crofts, MD (Neurology) Wellington Hampshire, MD as Consulting Physician (Cardiology) [name] Betsey Holiday, MD as Physician Assistant (Endocrinology)    Assessment:   This is a routine wellness examination  for Jessica Montoya.  Exercise Activities and Dietary recommendations Current Exercise Habits: The patient does not participate in regular exercise at present, Exercise limited by: None identified  Goals    . DIET - INCREASE WATER INTAKE     Recommend drinking at least 6-8 glasses of water a day        Fall Risk Fall Risk  06/01/2018 05/11/2018 03/01/2018 09/28/2017 05/02/2017  Falls in the past year? Yes No No Yes Yes  Number falls in past yr: 1 - - 1 1  Injury with Fall? No - - Yes Yes  Follow up - - - Falls evaluation completed -   Is the patient's home free of loose throw rugs in walkways, pet beds, electrical cords, etc?   yes      Grab bars in the bathroom? yes      Handrails on the stairs?   no stairs       Adequate lighting?   yes  Timed Get Up and Go performed: Completed in 8 seconds with no use of assistive devices, steady gait. No intervention needed at this time.   Depression Screen PHQ 2/9 Scores 06/01/2018 09/28/2017 09/28/2017 09/28/2017  PHQ - 2 Score 0 1 1 1   PHQ- 9 Score - 13 13 -     Cognitive Function     6CIT Screen 06/01/2018  What Year? 0 points  What month? 0 points  What time? 0 points  Count back from 20 0 points  Months in reverse 0 points  Repeat phrase 0 points  Total Score 0    Immunization History  Administered Date(s) Administered  . Pneumococcal Conjugate-13 08/06/2015  . Pneumococcal Polysaccharide-23 03/06/2005, 11/24/2016  . Td 10/04/2005  . Zoster 01/14/2014    Qualifies for Shingles Vaccine? Yes, discussed shingrix vaccine   Screening Tests Health Maintenance  Topic Date Due  . MAMMOGRAM  01/31/2016  . OPHTHALMOLOGY EXAM  11/24/2017  . INFLUENZA VACCINE  07/12/2018 (Originally 05/04/2018)  . TETANUS/TDAP  11/09/2018 (Originally 10/05/2015)  . HEMOGLOBIN A1C  11/11/2018  . FOOT EXAM  05/12/2019  . COLONOSCOPY  09/12/2023  . DEXA SCAN  Completed  . Hepatitis C Screening  Completed  . PNA vac Low Risk Adult  Completed    Cancer  Screenings: Lung: Low Dose CT Chest recommended if Age 30-80 years, 30 pack-year currently smoking OR have quit w/in 15years. Patient does not qualify. Breast:  Up to date on Mammogram? No  ordered Up to date of Bone Density/Dexa? Yes 10/11/2017 Colorectal: completed 09/11/2013, referral sent to [facility] on 05/11/2018  Additional Screenings:  Hepatitis C Screening:  Completed 08/06/2015     Plan:    I have personally reviewed and addressed the Medicare Annual Wellness questionnaire and have noted the following in the patient's chart:  A. Medical and social history B. Use of alcohol, tobacco or illicit drugs  C. Current medications and supplements D. Functional ability and status E.  Nutritional status F.  Physical activity G. Advance directives H. List of other physicians I.  Hospitalizations, surgeries, and ER visits in previous 12 months J.  [place] such as hearing and vision if needed, cognitive and depression L. Referrals and appointments   In addition, I have reviewed and discussed with patient certain preventive protocols, quality metrics, and best practice recommendations. A written personalized care plan for preventive services as well as general preventive health recommendations were provided to patient.   Signed,  Tyler Aas, LPN Nurse Health Advisor   Nurse Notes: will have diabetic eye exam done this September to eye dr in [place], she will have them fax over results when it is completed.

## 2018-06-22 ENCOUNTER — Telehealth: Payer: Self-pay | Admitting: Family Medicine

## 2018-06-22 NOTE — Telephone Encounter (Signed)
Patient should take the OTC monistat for 5 days- if not better, will need to be seen for a swab.

## 2018-06-22 NOTE — Telephone Encounter (Signed)
Copied from [place] [phone]. Topic: Quick Communication - See Telephone Encounter >> Jun 22, 2018  4:28 PM Antonieta Iba C wrote: CRM for notification. See Telephone encounter for: 06/22/18.  Pt called in to see if provider would call her in a Rx for an antibiotic for a yeast infection. Pt says that she is having burning, itching.. She said that when her sugar gets high she gets a yeast infection and her provider has to call her something in for it.    Pharmacy: [id number] - [place], [address] [phone] (Phone) [phone] (Fax)

## 2018-06-23 NOTE — Telephone Encounter (Signed)
Patient notified

## 2018-06-28 ENCOUNTER — Encounter: Payer: Self-pay | Admitting: Nurse Practitioner

## 2018-06-28 ENCOUNTER — Ambulatory Visit (INDEPENDENT_AMBULATORY_CARE_PROVIDER_SITE_OTHER): Payer: Medicare Other | Admitting: Nurse Practitioner

## 2018-06-28 VITALS — BP 110/60 | HR 79 | Ht 66.0 in | Wt 159.2 lb

## 2018-06-28 DIAGNOSIS — I10 Essential (primary) hypertension: Secondary | ICD-10-CM | POA: Diagnosis not present

## 2018-06-28 DIAGNOSIS — R5383 Other fatigue: Secondary | ICD-10-CM

## 2018-06-28 NOTE — Progress Notes (Signed)
Office Visit    Patient Name: Jessica Montoya Date of Encounter: 06/28/2018  Primary Care Provider:  [name], NP Primary Cardiologist:  Kathlyn Sacramento, MD  Chief Complaint    67 year old female with a history of hypertension, hyperlipidemia, type 2 diabetes mellitus, stage II chronic kidney disease, and peripheral neuropathy, who presents for follow-up of fatigue and dyspnea with recent cardiovascular stress testing.  Past Medical History    Past Medical History:  Diagnosis Date  . Anxiety   . Chronic pain   . CKD (chronic kidney disease), stage II   . Depression   . Diabetes mellitus without complication ([place])   . Diabetic neuropathy ([place])   . Fatigue    a. 05/2018 Echo: EF 60-65%, no rwma, nl RV fxn; b. 05/2018 Lexiscan MV: EF>65%. No ischemia/infarct.  . Hyperlipidemia   . Hypertension   . Insomnia   . Long term current use of insulin ([employer])   . Peripheral neuropathy    Past Surgical History:  Procedure Laterality Date  . ABDOMINAL HYSTERECTOMY    . COLON SURGERY      Allergies  Allergies  Allergen Reactions  . Exenatide Nausea Only    Caused nausea ([facility]) 10/28/2009  . Insulin Aspart Nausea Only    Caused nausea ([facility]) 10/28/2009  . Other Nausea Only    Caused nausea ([facility]) 10/28/2009  . Compazine [Prochlorperazine Edisylate] Rash    History of Present Illness    67 year old female with the above past medical history including hypertension, hyperlipidemia, type 2 diabetes mellitus, peripheral neuropathy, stage II chronic kidney disease, and colon cancer status post colectomy in 2012.  She was recently evaluated by Dr. Fletcher Anon in clinic for a 56-month history of extreme exertional fatigue and dyspnea.  She also reported intermittent episodes of chest tightness occurring at rest and with exertion.  Echocardiogram was performed and showed normal LV function without regional wall motion abnormalities.  Stress testing was also performed and showed no evidence  of ischemia or infarct.  Since her last visit, Ms. Rockhold says that she feels as though her fatigue has improved some.  She is trying to force herself to do more activities and walk on a more regular basis.  She is encouraged by her normal test results and hopes to be able to be more active going forward.  She denies chest pain, dyspnea, palpitations, PND, orthopnea, dizziness, syncope, edema, or early satiety.  Home Medications    Prior to Admission medications   Medication Sig Start Date End Date Taking? Authorizing Provider  amitriptyline (ELAVIL) 10 MG tablet TAKE 2 TABLETS (20 MG TOTAL) BY MOUTH AT BEDTIME AS NEEDED FOR SLEEP. 03/27/18  Yes Johnson, Megan P, DO  aspirin 81 MG tablet Take 81 mg by mouth daily.   Yes [provider]  Cholecalciferol (VITAMIN D PO) Take by mouth.   Yes [provider]  ferrous fumarate (HEMOCYTE - 106 MG FE) 325 (106 FE) MG TABS tablet Take 1 tablet by mouth daily.   Yes [provider]  FLUoxetine (PROZAC) 20 MG capsule TAKE 1 CAPSULE BY MOUTH EVERY DAY 05/18/18  Yes Carles Collet M, PA-C  gabapentin (NEURONTIN) 300 MG capsule Take 1 capsule (300 mg total) by mouth 2 (two) times daily. 09/28/17  Yes Kathrine Haddock, NP  Insulin Glargine (LANTUS SOLOSTAR) 100 UNIT/ML Solostar Pen Inject 90 Units into the skin at bedtime. 10/28/17 10/28/18 Yes [provider]  insulin lispro (HUMALOG KWIKPEN) 100 UNIT/ML KiwkPen Take 26 units  three times daily before meals 10/11/17  Yes [provider]  meloxicam (MOBIC) 15 MG tablet Take 15 mg by mouth daily. 11/03/17  Yes [provider]  Multiple Vitamin (MULTIVITAMIN) tablet Take 1 tablet by mouth daily.   Yes [provider]  omeprazole (PRILOSEC) 20 MG capsule TAKE 1 CAPSULE BY MOUTH  DAILY 03/22/18  Yes Kathrine Haddock, NP  Probiotic Product (PROBIOTIC PO) Take 1 tablet by mouth daily.   Yes [provider]  ramipril (ALTACE) 2.5 MG capsule TAKE 1 CAPSULE  (2.5 MG TOTAL) BY MOUTH DAILY. 01/23/18  Yes Kathrine Haddock, NP  simvastatin (ZOCOR) 40 MG tablet Take 1 tablet (40 mg total) by mouth daily. 09/28/17  Yes Kathrine Haddock, NP  traZODone (DESYREL) 50 MG tablet TAKE 0.5-1 TABLETS (25-50 MG TOTAL) BY MOUTH AT BEDTIME AS NEEDED FOR SLEEP. 03/01/18  Yes Kathrine Haddock, NP    Review of Systems    She denies chest pain, palpitations, dyspnea, pnd, orthopnea, n, v, dizziness, syncope, edema, weight gain, or early satiety.  All other systems reviewed and are otherwise negative except as noted above.  Physical Exam    VS:  BP 110/60 (BP Location: Left Arm, Patient Position: Sitting, Cuff Size: Normal)   Pulse 79   Ht 5\' 6"  (1.676 m)   Wt 159 lb 4 oz (72.2 kg)   LMP  (LMP Unknown)   BMI 25.70 kg/m  , BMI Body mass index is 25.7 kg/m. GEN: Well nourished, well developed, in no acute distress. HEENT: normal. Neck: Supple, no JVD, carotid bruits, or masses. Cardiac: RRR, no murmurs, rubs, or gallops. No clubbing, cyanosis, edema.  Radials/DP/PT 2+ and equal bilaterally.  Respiratory:  Respirations regular and unlabored, clear to auscultation bilaterally. GI: Soft, nontender, nondistended, BS + x 4. MS: no deformity or atrophy. Skin: warm and dry, no rash. Neuro:  Strength and sensation are intact. Psych: Normal affect.  Accessory Clinical Findings    ECG personally reviewed by me today -regular sinus rhythm, 79, left axis deviation- no acute changes.  Assessment & Plan    1.  Fatigue/dyspnea: Status post recent evaluation with stress testing and echocardiogram, both of which were normal.  She has been encouraged by these results and says that she feels as though her fatigue has improved slightly.  She is trying to make herself be more active and seems to have a better outlook.  She has not been having any chest pain.  No further work-up indicated at this time.  2.  Essential hypertension: This is well controlled on ACE inhibitor therapy.  3.   Hyperlipidemia: LDL was 61 last December with normal LFTs in August.  4.  Type 2 diabetes mellitus: A1c was 9.9 in April and more recently was 12.5 on September 6.  She is followed by endocrinology and has recently had adjustment to her insulin dosing.  5.  Disposition: Follow-up in 6 months or sooner if necessary.   Murray Hodgkins, NP 06/28/2018, 4:32 PM

## 2018-06-28 NOTE — Patient Instructions (Signed)
Medication Instructions: Your physician recommends that you continue on your current medications as directed. Please refer to the Current Medication list given to you today.  If you need a refill on your cardiac medications before your next appointment, please call your pharmacy.   Follow-Up: Your physician wants you to follow-up in 6 months with Dr. Arida. You will receive a reminder letter in the mail two months in advance. If you don't receive a letter, please call our office at [phone] to schedule this follow-up appointment.  Thank you for choosing [facility]!    

## 2018-08-01 ENCOUNTER — Other Ambulatory Visit: Payer: Self-pay | Admitting: Family Medicine

## 2018-08-15 ENCOUNTER — Encounter: Payer: Self-pay | Admitting: Nurse Practitioner

## 2018-08-15 ENCOUNTER — Ambulatory Visit: Payer: Medicare Other | Admitting: Nurse Practitioner

## 2018-08-15 DIAGNOSIS — I10 Essential (primary) hypertension: Secondary | ICD-10-CM | POA: Diagnosis not present

## 2018-08-15 DIAGNOSIS — E1165 Type 2 diabetes mellitus with hyperglycemia: Secondary | ICD-10-CM

## 2018-08-15 DIAGNOSIS — E1122 Type 2 diabetes mellitus with diabetic chronic kidney disease: Secondary | ICD-10-CM

## 2018-08-15 DIAGNOSIS — E782 Mixed hyperlipidemia: Secondary | ICD-10-CM | POA: Diagnosis not present

## 2018-08-15 DIAGNOSIS — IMO0002 Reserved for concepts with insufficient information to code with codable children: Secondary | ICD-10-CM

## 2018-08-15 MED ORDER — OMEPRAZOLE 20 MG PO CPDR: 20.0000 mg | DELAYED_RELEASE_CAPSULE | Freq: Every day | ORAL | 3 refills | Status: DC

## 2018-08-15 MED ORDER — FLUOXETINE HCL 20 MG PO CAPS: ORAL_CAPSULE | ORAL | 3 refills | Status: DC

## 2018-08-15 MED ORDER — TRAZODONE HCL 50 MG PO TABS: 25.0000 mg | ORAL_TABLET | Freq: Every evening | ORAL | 3 refills | Status: DC | PRN

## 2018-08-15 MED ORDER — SIMVASTATIN 40 MG PO TABS: 40.0000 mg | ORAL_TABLET | Freq: Every day | ORAL | 3 refills | Status: DC

## 2018-08-15 MED ORDER — RAMIPRIL 2.5 MG PO CAPS: 2.5000 mg | ORAL_CAPSULE | Freq: Every day | ORAL | 3 refills | Status: DC

## 2018-08-15 NOTE — Progress Notes (Signed)
BP 105/69 (BP Location: Left Arm, Patient Position: Sitting, Cuff Size: Normal)   Pulse 86 Comment: apical  Ht 5\' 6"  (1.676 m)   Wt 150 lb 8 oz (68.3 kg)   LMP  (LMP Unknown)   SpO2 99%   BMI 24.29 kg/m    Subjective:    Patient ID: Jessica Montoya, female    DOB: [birth year], 67 y.o.   MRN: [id number]  HPI: Jessica Montoya is a 67 y.o. female presents for f/u HTN/HLD, and T2DM  Chief Complaint  Patient presents with  . Hypertension  . Hyperlipidemia  . Diabetes   HYPERTENSION / HYPERLIPIDEMIA Seen by cardiology, NP Sharolyn Douglas, 06/28/18 for fatigue & dyspnea.  Stress test was negative for ischemia and echo showed normal LV function.  Continues to report some fatigue.  Discussed sleep study, sleep apnea, with patient.  Provided more information via print out to her on this and she wishes to think about having study performed.  States she has not had that testing.   Satisfied with current treatment? yes Duration of hypertension: chronic BP monitoring frequency: daily BP range: 110-120/60-70's BP medication side effects: no Past BP meds: none Duration of hyperlipidemia: chronic Cholesterol medication side effects: no Cholesterol supplements: none Past cholesterol medications:none Medication compliance: excellent compliance Aspirin: yes Recent stressors: no Recurrent headaches: no Visual changes: no Palpitations: no Dyspnea: no Chest pain: no Lower extremity edema: no Dizzy/lightheaded: no   DIABETES Is followed by Dr. [name], has next labs 09/15/18 and appointment with Dr. [name] after this. Recent A1C was 12.5% and per note patient was not taking insulin as prescribed.  She reports she has been taking as instructed.  Hypoglycemic episodes:no Polydipsia/polyuria: no Visual disturbance: no Chest pain: no Paresthesias: no Glucose Monitoring: yes  Accucheck frequency: BID  Fasting glucose:  Post prandial:  Evening:  Before meals: Taking Insulin?: yes  Long acting  insulin:  Short acting insulin: Blood Pressure Monitoring: daily Retinal Examination: Not up to Date, will reschedule eye exam Foot Exam: Up to Date Diabetic Education: Completed Pneumovax: Up to Date Influenza: Not up to Date Aspirin: yes  Relevant past medical, surgical, family and social history reviewed and updated as indicated. Interim medical history since our last visit reviewed. Allergies and medications reviewed and updated.  Review of Systems  Constitutional: Negative for activity change, appetite change, diaphoresis, fatigue and fever.  Respiratory: Negative for cough, chest tightness, shortness of breath and wheezing.   Cardiovascular: Negative for chest pain, palpitations and leg swelling.  Gastrointestinal: Negative for abdominal distention, abdominal pain, constipation, diarrhea, nausea and vomiting.  Endocrine: Negative for cold intolerance, heat intolerance, polydipsia, polyphagia and polyuria.  Genitourinary: Negative.   Musculoskeletal: Negative.   Skin: Negative.   Neurological: Negative for dizziness, numbness and headaches.  Psychiatric/Behavioral: Negative.     Per HPI unless specifically indicated above     Objective:    BP 105/69 (BP Location: Left Arm, Patient Position: Sitting, Cuff Size: Normal)   Pulse 86 Comment: apical  Ht 5\' 6"  (1.676 m)   Wt 150 lb 8 oz (68.3 kg)   LMP  (LMP Unknown)   SpO2 99%   BMI 24.29 kg/m   Wt Readings from Last 3 Encounters:  08/15/18 150 lb 8 oz (68.3 kg)  06/28/18 159 lb 4 oz (72.2 kg)  06/01/18 154 lb 3.2 oz (69.9 kg)    Physical Exam  Constitutional: She is oriented to person, place, and time. She appears well-developed and well-nourished.  HENT:  Head:  Normocephalic and atraumatic.  Right Ear: Hearing normal.  Left Ear: Hearing normal.  Eyes: Pupils are equal, round, and reactive to light. Conjunctivae and EOM are normal. Right eye exhibits no discharge. Left eye exhibits no discharge.  Neck: Normal range  of motion. Neck supple. No JVD present. Carotid bruit is not present. No thyromegaly present.  Cardiovascular: Normal rate, regular rhythm and normal heart sounds.  Pulmonary/Chest: Effort normal and breath sounds normal.  Abdominal: Soft. Bowel sounds are normal. There is no splenomegaly or hepatomegaly.  Musculoskeletal: Normal range of motion.  Lymphadenopathy:    She has no cervical adenopathy.  Neurological: She is alert and oriented to person, place, and time.  Skin: Skin is warm and dry.  Psychiatric: She has a normal mood and affect. Her behavior is normal. Judgment and thought content normal.    Results for orders placed or performed in visit on 05/16/18  NM Myocar Multi W/Spect W/Wall Motion / EF  Result Value Ref Range   Rest HR 91 bpm   Rest BP 126/69 mmHg   Percent HR 71 %   Peak HR 109 bpm   Peak BP 133/63 mmHg   SSS 0    SRS 0    SDS 0    TID 0.54    LV sys vol 1 mL   LV dias vol 11 46 - 106 mL      Assessment & Plan:   Problem List Items Addressed This Visit      Cardiovascular and Mediastinum   Hypertension    Chronic, stable.  Continue current med regimen.      Relevant Medications   ramipril (ALTACE) 2.5 MG capsule   simvastatin (ZOCOR) 40 MG tablet     Endocrine   Uncontrolled type 2 diabetes mellitus with chronic kidney disease (HCC)    Chronic, followed by Dr. [name].  Continue to collaborate with Dr. [name].  CMP next visit. Continues on Ramipril for kidney protection in T2DM.      Relevant Medications   ramipril (ALTACE) 2.5 MG capsule   simvastatin (ZOCOR) 40 MG tablet     Other   Hyperlipidemia    Chronic, stable.  Continue statin.  Lipid panel today.      Relevant Medications   ramipril (ALTACE) 2.5 MG capsule   simvastatin (ZOCOR) 40 MG tablet   Other Relevant Orders   Lipid Panel w/o Chol/HDL Ratio       Follow up plan: Return in about 6 months (around 02/13/2019) for T2DM, HTN/HLD (need lipid panel).

## 2018-08-15 NOTE — Assessment & Plan Note (Signed)
Chronic, stable.  Continue current med regimen.

## 2018-08-15 NOTE — Assessment & Plan Note (Addendum)
Chronic, stable.  Continue statin.  Lipid panel today.

## 2018-08-15 NOTE — Assessment & Plan Note (Addendum)
Chronic, followed by Dr. [name].  Continue to collaborate with Dr. [name].  CMP next visit. Continues on Ramipril for kidney protection in T2DM.

## 2018-08-15 NOTE — Patient Instructions (Signed)

## 2018-08-16 ENCOUNTER — Telehealth: Payer: Self-pay | Admitting: Nurse Practitioner

## 2018-08-16 LAB — LIPID PANEL W/O CHOL/HDL RATIO
Cholesterol, Total: 148 mg/dL (ref 100–199)
HDL: 59 mg/dL (ref >39)
LDL Calculated: 53 mg/dL (ref 0–99)
Triglycerides: 181 mg/dL — ABNORMAL HIGH (ref 0–149)
VLDL CHOLESTEROL CAL: 36 mg/dL (ref 5–40)

## 2018-08-16 NOTE — Telephone Encounter (Signed)
Done

## 2018-09-05 ENCOUNTER — Other Ambulatory Visit: Payer: Self-pay | Admitting: Physician Assistant

## 2018-09-05 DIAGNOSIS — Z1231 Encounter for screening mammogram for malignant neoplasm of breast: Secondary | ICD-10-CM

## 2018-10-16 ENCOUNTER — Ambulatory Visit
Admission: RE | Admit: 2018-10-16 | Discharge: 2018-10-16 | Disposition: A | Discharge status: Home / Self Care | Payer: Medicare Other | Source: Ambulatory Visit | Attending: Physician Assistant | Admitting: Physician Assistant

## 2018-10-16 DIAGNOSIS — Z1231 Encounter for screening mammogram for malignant neoplasm of breast: Secondary | ICD-10-CM | POA: Diagnosis not present

## 2018-10-17 NOTE — Progress Notes (Signed)
Normal test results noted.  Please call patient and make them aware of normal results and will continue to monitor at regular visits.  Have a great day.  Look forward to seeing you at your next visit.

## 2018-11-13 LAB — HM DIABETES EYE EXAM

## 2018-12-04 DIAGNOSIS — H3500 Unspecified background retinopathy: Secondary | ICD-10-CM | POA: Insufficient documentation

## 2019-01-02 ENCOUNTER — Ambulatory Visit: Admission: RE | Admit: 2019-01-02 | Payer: Medicare Other | Source: Home / Self Care | Admitting: Ophthalmology

## 2019-01-02 ENCOUNTER — Encounter: Admission: RE | Payer: Self-pay | Source: Home / Self Care

## 2019-01-02 SURGERY — PHACOEMULSIFICATION, CATARACT, WITH IOL INSERTION
Anesthesia: Choice | Laterality: Left

## 2019-02-14 ENCOUNTER — Ambulatory Visit: Payer: Medicare Other | Admitting: Nurse Practitioner

## 2019-02-15 ENCOUNTER — Other Ambulatory Visit: Payer: Self-pay

## 2019-02-15 ENCOUNTER — Encounter: Payer: Self-pay | Admitting: *Deleted

## 2019-02-16 ENCOUNTER — Other Ambulatory Visit
Admission: RE | Admit: 2019-02-16 | Discharge: 2019-02-16 | Disposition: A | Discharge status: Home / Self Care | Payer: Medicare Other | Source: Ambulatory Visit | Attending: Ophthalmology | Admitting: Ophthalmology

## 2019-02-16 DIAGNOSIS — Z1159 Encounter for screening for other viral diseases: Secondary | ICD-10-CM | POA: Insufficient documentation

## 2019-02-17 LAB — NOVEL CORONAVIRUS, NAA (HOSP ORDER, SEND-OUT TO REF LAB; TAT 18-24 HRS): SARS-CoV-2, NAA: NOT DETECTED

## 2019-02-19 NOTE — Anesthesia Preprocedure Evaluation (Addendum)
Anesthesia Evaluation  Patient identified by MRN, date of birth, ID band Patient awake    Reviewed: Allergy & Precautions, NPO status , Patient's Chart, lab work & pertinent test results  History of Anesthesia Complications Negative for: history of anesthetic complications  Airway Mallampati: IV   Neck ROM: Full    Dental  (+) Lower Dentures, Upper Dentures   Pulmonary neg pulmonary ROS,    Pulmonary exam normal breath sounds clear to auscultation       Cardiovascular hypertension, Normal cardiovascular exam Rhythm:Regular Rate:Normal     Neuro/Psych PSYCHIATRIC DISORDERS Anxiety Depression Vertigo   Neuromuscular disease (neuropathy)    GI/Hepatic GERD  Medicated and Controlled,  Endo/Other  diabetes, Type 2, Insulin Dependent  Renal/GU Renal disease (stage II CKD)     Musculoskeletal  (+) Arthritis ,   Abdominal   Peds  Hematology negative hematology ROS (+)   Anesthesia Other Findings   Reproductive/Obstetrics                            Anesthesia Physical Anesthesia Plan  ASA: III  Anesthesia Plan: MAC   Post-op Pain Management:    Induction: Intravenous  PONV Risk Score and Plan: 2 and TIVA and Midazolam  Airway Management Planned: Natural Airway  Additional Equipment:   Intra-op Plan:   Post-operative Plan:   Informed Consent: I have reviewed the patients History and Physical, chart, labs and discussed the procedure including the risks, benefits and alternatives for the proposed anesthesia with the patient or authorized representative who has indicated his/her understanding and acceptance.       Plan Discussed with: CRNA  Anesthesia Plan Comments:        Anesthesia Quick Evaluation

## 2019-02-20 ENCOUNTER — Ambulatory Visit: Payer: Medicare Other | Admitting: Anesthesiology

## 2019-02-20 ENCOUNTER — Ambulatory Visit
Admission: RE | Admit: 2019-02-20 | Discharge: 2019-02-20 | Disposition: A | Discharge status: Home / Self Care | Payer: Medicare Other | Attending: Ophthalmology | Admitting: Ophthalmology

## 2019-02-20 ENCOUNTER — Encounter
Admission: RE | Disposition: A | Discharge status: Home / Self Care | Payer: Self-pay | Source: Home / Self Care | Attending: Ophthalmology

## 2019-02-20 DIAGNOSIS — E1136 Type 2 diabetes mellitus with diabetic cataract: Secondary | ICD-10-CM | POA: Insufficient documentation

## 2019-02-20 DIAGNOSIS — H9190 Unspecified hearing loss, unspecified ear: Secondary | ICD-10-CM | POA: Diagnosis not present

## 2019-02-20 DIAGNOSIS — Z85038 Personal history of other malignant neoplasm of large intestine: Secondary | ICD-10-CM | POA: Diagnosis not present

## 2019-02-20 DIAGNOSIS — K219 Gastro-esophageal reflux disease without esophagitis: Secondary | ICD-10-CM | POA: Insufficient documentation

## 2019-02-20 DIAGNOSIS — E1122 Type 2 diabetes mellitus with diabetic chronic kidney disease: Secondary | ICD-10-CM | POA: Diagnosis not present

## 2019-02-20 DIAGNOSIS — N182 Chronic kidney disease, stage 2 (mild): Secondary | ICD-10-CM | POA: Insufficient documentation

## 2019-02-20 DIAGNOSIS — Z794 Long term (current) use of insulin: Secondary | ICD-10-CM | POA: Diagnosis not present

## 2019-02-20 DIAGNOSIS — H2512 Age-related nuclear cataract, left eye: Secondary | ICD-10-CM | POA: Insufficient documentation

## 2019-02-20 DIAGNOSIS — F419 Anxiety disorder, unspecified: Secondary | ICD-10-CM | POA: Diagnosis not present

## 2019-02-20 DIAGNOSIS — Z79899 Other long term (current) drug therapy: Secondary | ICD-10-CM | POA: Insufficient documentation

## 2019-02-20 DIAGNOSIS — I129 Hypertensive chronic kidney disease with stage 1 through stage 4 chronic kidney disease, or unspecified chronic kidney disease: Secondary | ICD-10-CM | POA: Insufficient documentation

## 2019-02-20 HISTORY — DX: Unspecified osteoarthritis, unspecified site: M19.90

## 2019-02-20 HISTORY — DX: Dizziness and giddiness: R42

## 2019-02-20 HISTORY — DX: Presence of dental prosthetic device (complete) (partial): Z97.2

## 2019-02-20 HISTORY — PX: CATARACT EXTRACTION W/PHACO: SHX586

## 2019-02-20 LAB — GLUCOSE, CAPILLARY
Glucose-Capillary: 267 mg/dL — ABNORMAL HIGH (ref 70–99)
Glucose-Capillary: 219 mg/dL — ABNORMAL HIGH (ref 70–99)

## 2019-02-20 SURGERY — PHACOEMULSIFICATION, CATARACT, WITH IOL INSERTION
Anesthesia: Monitor Anesthesia Care | Site: Eye | Laterality: Left

## 2019-02-20 MED ORDER — LACTATED RINGERS IV SOLN: INTRAVENOUS | Status: DC

## 2019-02-20 MED ORDER — MIDAZOLAM HCL 2 MG/2ML IJ SOLN
INTRAMUSCULAR | Status: DC | PRN
  Administered 2019-02-20 (×2): 1 mg via INTRAVENOUS

## 2019-02-20 MED ORDER — BRIMONIDINE TARTRATE-TIMOLOL 0.2-0.5 % OP SOLN
OPHTHALMIC | Status: DC | PRN
  Administered 2019-02-20: 1 [drp] via OPHTHALMIC

## 2019-02-20 MED ORDER — LIDOCAINE HCL (PF) 2 % IJ SOLN
INTRAOCULAR | Status: DC | PRN
  Administered 2019-02-20: 1 mL

## 2019-02-20 MED ORDER — ARMC OPHTHALMIC DILATING DROPS
1.0000 "application " | OPHTHALMIC | Status: DC | PRN
  Administered 2019-02-20 (×3): 1 via OPHTHALMIC

## 2019-02-20 MED ORDER — TETRACAINE HCL 0.5 % OP SOLN
1.0000 [drp] | OPHTHALMIC | Status: DC | PRN
  Administered 2019-02-20 (×3): 1 [drp] via OPHTHALMIC

## 2019-02-20 MED ORDER — MOXIFLOXACIN HCL 0.5 % OP SOLN
OPHTHALMIC | Status: DC | PRN
  Administered 2019-02-20: 0.2 mL via OPHTHALMIC

## 2019-02-20 MED ORDER — NA CHONDROIT SULF-NA HYALURON 40-17 MG/ML IO SOLN
INTRAOCULAR | Status: DC | PRN
  Administered 2019-02-20: 1 mL via INTRAOCULAR

## 2019-02-20 MED ORDER — NON FORMULARY
5.0000 [IU] | Freq: Once | Status: AC
  Administered 2019-02-20: 10:00:00 5 [IU] via INTRAVENOUS

## 2019-02-20 MED ORDER — EPINEPHRINE PF 1 MG/ML IJ SOLN
INTRAOCULAR | Status: DC | PRN
  Administered 2019-02-20: 103 mL via OPHTHALMIC

## 2019-02-20 MED ORDER — FENTANYL CITRATE (PF) 100 MCG/2ML IJ SOLN
INTRAMUSCULAR | Status: DC | PRN
  Administered 2019-02-20: 50 ug via INTRAVENOUS

## 2019-02-20 SURGICAL SUPPLY — 21 items
CANNULA ANT/CHMB 27G (MISCELLANEOUS) ×1 IMPLANT
CANNULA ANT/CHMB 27GA (MISCELLANEOUS) ×3 IMPLANT
GLOVE SURG LX 8.0 MICRO (GLOVE) ×2
GLOVE SURG LX STRL 8.0 MICRO (GLOVE) ×1 IMPLANT
GLOVE SURG TRIUMPH 8.0 PF LTX (GLOVE) ×3 IMPLANT
GOWN STRL REUS W/ TWL LRG LVL3 (GOWN DISPOSABLE) ×2 IMPLANT
GOWN STRL REUS W/TWL LRG LVL3 (GOWN DISPOSABLE) ×4
LENS IOL TECNIS ITEC 18.5 (Intraocular Lens) ×2 IMPLANT
MARKER SKIN DUAL TIP RULER LAB (MISCELLANEOUS) ×3 IMPLANT
NDL FILTER BLUNT 18X1 1/2 (NEEDLE) ×1 IMPLANT
NDL RETROBULBAR .5 NSTRL (NEEDLE) ×3 IMPLANT
NEEDLE FILTER BLUNT 18X 1/2SAF (NEEDLE) ×2
NEEDLE FILTER BLUNT 18X1 1/2 (NEEDLE) ×1 IMPLANT
PACK CATARACT BRASINGTON (MISCELLANEOUS) ×2 IMPLANT
PACK EYE AFTER SURG (MISCELLANEOUS) ×3 IMPLANT
PACK OPTHALMIC (MISCELLANEOUS) ×3 IMPLANT
SUT ETHILON 10-0 CS-B-6CS-B-6 (SUTURE)
SUTURE EHLN 10-0 CS-B-6CS-B-6 (SUTURE) IMPLANT
SYR 3ML LL SCALE MARK (SYRINGE) ×3 IMPLANT
SYR TB 1ML LUER SLIP (SYRINGE) ×3 IMPLANT
WIPE NON LINTING 3.25X3.25 (MISCELLANEOUS) ×3 IMPLANT

## 2019-02-20 NOTE — Anesthesia Postprocedure Evaluation (Signed)
Anesthesia Post Note  Patient: Jessica Montoya  Procedure(s) Performed: CATARACT EXTRACTION PHACO AND INTRAOCULAR LENS PLACEMENT ([facility]) LEFT DIABETES (Left Eye)  Patient location during evaluation: PACU Anesthesia Type: MAC Level of consciousness: awake and alert, oriented and patient cooperative Pain management: pain level controlled Vital Signs Assessment: post-procedure vital signs reviewed and stable Respiratory status: spontaneous breathing, nonlabored ventilation and respiratory function stable Cardiovascular status: blood pressure returned to baseline and stable Postop Assessment: adequate PO intake Anesthetic complications: no    Darrin Nipper

## 2019-02-20 NOTE — H&P (Signed)
All labs reviewed. Abnormal studies sent to patients PCP when indicated.  Previous H&P reviewed, patient examined, there are NO CHANGES.  Jessica Jost Porfilio5/19/202010:30 AM

## 2019-02-20 NOTE — Transfer of Care (Signed)
Immediate Anesthesia Transfer of Care Note  Patient: Jessica Montoya  Procedure(s) Performed: CATARACT EXTRACTION PHACO AND INTRAOCULAR LENS PLACEMENT ([facility]) LEFT DIABETES (Left Eye)  Patient Location: PACU  Anesthesia Type: MAC  Level of Consciousness: awake, alert  and patient cooperative  Airway and Oxygen Therapy: Patient Spontanous Breathing and Patient connected to supplemental oxygen  Post-op Assessment: Post-op Vital signs reviewed, Patient's Cardiovascular Status Stable, Respiratory Function Stable, Patent Airway and No signs of Nausea or vomiting  Post-op Vital Signs: Reviewed and stable  Complications: No apparent anesthesia complications

## 2019-02-20 NOTE — Op Note (Signed)
PREOPERATIVE DIAGNOSIS:  Nuclear sclerotic cataract of the left eye.   POSTOPERATIVE DIAGNOSIS:  Nuclear sclerotic cataract of the left eye.   OPERATIVE PROCEDURE: Procedure(s): CATARACT EXTRACTION PHACO AND INTRAOCULAR LENS PLACEMENT ([place]) LEFT DIABETES   SURGEON:  Birder Robson, MD.   ANESTHESIA:  Anesthesiologist: Darrin Nipper, MD CRNA: Cameron Ali, CRNA  1.      Managed anesthesia care. 2.     0.17ml of Shugarcaine was instilled following the paracentesis   COMPLICATIONS:  None.   TECHNIQUE:   Stop and chop   DESCRIPTION OF PROCEDURE:  The patient was examined and consented in the preoperative holding area where the aforementioned topical anesthesia was applied to the left eye and then brought back to the Operating Room where the left eye was prepped and draped in the usual sterile ophthalmic fashion and a lid speculum was placed. A paracentesis was created with the side port blade and the anterior chamber was filled with viscoelastic. A near clear corneal incision was performed with the steel keratome. A continuous curvilinear capsulorrhexis was performed with a cystotome followed by the capsulorrhexis forceps. Hydrodissection and hydrodelineation were carried out with BSS on a blunt cannula. The lens was removed in a stop and chop  technique and the remaining cortical material was removed with the irrigation-aspiration handpiece. The capsular bag was inflated with viscoelastic and the Technis [id number] lens was placed in the capsular bag without complication. The remaining viscoelastic was removed from the eye with the irrigation-aspiration handpiece. The wounds were hydrated. The anterior chamber was flushed with Miostat and the eye was inflated to physiologic pressure. 0.40ml Vigamox was placed in the anterior chamber. The wounds were found to be water tight. The eye was dressed with Vigamox. The patient was given protective glasses to wear throughout the day and a shield with which to  sleep tonight. The patient was also given drops with which to begin a drop regimen today and will follow-up with me in one day. Implant Name Type Inv. Item Serial No. Manufacturer Lot No. LRB No. Used  LENS IOL DIOP 18.5 - [id number] Intraocular Lens LENS IOL DIOP 18.5 [id number] AMO  Left 1    Procedure(s): CATARACT EXTRACTION PHACO AND INTRAOCULAR LENS PLACEMENT ([facility]) LEFT DIABETES (Left)  Electronically signed: Birder Robson 02/20/2019 11:03 AM

## 2019-02-20 NOTE — Discharge Instructions (Signed)

## 2019-02-20 NOTE — Anesthesia Procedure Notes (Signed)
Procedure Name: MAC Performed by: Darcel Frane, CRNA Pre-anesthesia Checklist: Patient identified, Emergency Drugs available, Suction available, Timeout performed and Patient being monitored Patient Re-evaluated:Patient Re-evaluated prior to induction Oxygen Delivery Method: Nasal cannula Placement Confirmation: positive ETCO2       

## 2019-02-21 ENCOUNTER — Encounter: Payer: Self-pay | Admitting: Ophthalmology

## 2019-03-20 ENCOUNTER — Encounter: Payer: Self-pay | Admitting: *Deleted

## 2019-03-20 ENCOUNTER — Other Ambulatory Visit: Payer: Self-pay

## 2019-03-22 NOTE — Discharge Instructions (Signed)

## 2019-03-23 ENCOUNTER — Other Ambulatory Visit
Admission: RE | Admit: 2019-03-23 | Discharge: 2019-03-23 | Disposition: A | Discharge status: Home / Self Care | Payer: Medicare Other | Source: Ambulatory Visit | Attending: Ophthalmology | Admitting: Ophthalmology

## 2019-03-23 ENCOUNTER — Other Ambulatory Visit: Payer: Self-pay

## 2019-03-23 DIAGNOSIS — Z1159 Encounter for screening for other viral diseases: Secondary | ICD-10-CM | POA: Insufficient documentation

## 2019-03-24 LAB — NOVEL CORONAVIRUS, NAA (HOSP ORDER, SEND-OUT TO REF LAB; TAT 18-24 HRS): SARS-CoV-2, NAA: NOT DETECTED

## 2019-03-27 ENCOUNTER — Ambulatory Visit
Admission: RE | Admit: 2019-03-27 | Discharge: 2019-03-27 | Disposition: A | Discharge status: Home / Self Care | Payer: Medicare Other | Attending: Ophthalmology | Admitting: Ophthalmology

## 2019-03-27 ENCOUNTER — Encounter
Admission: RE | Disposition: A | Discharge status: Home / Self Care | Payer: Self-pay | Source: Home / Self Care | Attending: Ophthalmology

## 2019-03-27 ENCOUNTER — Ambulatory Visit: Payer: Medicare Other | Admitting: Anesthesiology

## 2019-03-27 ENCOUNTER — Other Ambulatory Visit: Payer: Self-pay

## 2019-03-27 DIAGNOSIS — Z9049 Acquired absence of other specified parts of digestive tract: Secondary | ICD-10-CM | POA: Diagnosis not present

## 2019-03-27 DIAGNOSIS — E7800 Pure hypercholesterolemia, unspecified: Secondary | ICD-10-CM | POA: Diagnosis not present

## 2019-03-27 DIAGNOSIS — F329 Major depressive disorder, single episode, unspecified: Secondary | ICD-10-CM | POA: Insufficient documentation

## 2019-03-27 DIAGNOSIS — F419 Anxiety disorder, unspecified: Secondary | ICD-10-CM | POA: Diagnosis not present

## 2019-03-27 DIAGNOSIS — Z9849 Cataract extraction status, unspecified eye: Secondary | ICD-10-CM | POA: Diagnosis not present

## 2019-03-27 DIAGNOSIS — Z888 Allergy status to other drugs, medicaments and biological substances status: Secondary | ICD-10-CM | POA: Diagnosis not present

## 2019-03-27 DIAGNOSIS — Z85038 Personal history of other malignant neoplasm of large intestine: Secondary | ICD-10-CM | POA: Insufficient documentation

## 2019-03-27 DIAGNOSIS — M1990 Unspecified osteoarthritis, unspecified site: Secondary | ICD-10-CM | POA: Insufficient documentation

## 2019-03-27 DIAGNOSIS — N182 Chronic kidney disease, stage 2 (mild): Secondary | ICD-10-CM | POA: Insufficient documentation

## 2019-03-27 DIAGNOSIS — I129 Hypertensive chronic kidney disease with stage 1 through stage 4 chronic kidney disease, or unspecified chronic kidney disease: Secondary | ICD-10-CM | POA: Diagnosis not present

## 2019-03-27 DIAGNOSIS — H2511 Age-related nuclear cataract, right eye: Secondary | ICD-10-CM | POA: Insufficient documentation

## 2019-03-27 DIAGNOSIS — E1122 Type 2 diabetes mellitus with diabetic chronic kidney disease: Secondary | ICD-10-CM | POA: Insufficient documentation

## 2019-03-27 DIAGNOSIS — K219 Gastro-esophageal reflux disease without esophagitis: Secondary | ICD-10-CM | POA: Diagnosis not present

## 2019-03-27 DIAGNOSIS — Z90710 Acquired absence of both cervix and uterus: Secondary | ICD-10-CM | POA: Insufficient documentation

## 2019-03-27 DIAGNOSIS — G709 Myoneural disorder, unspecified: Secondary | ICD-10-CM | POA: Diagnosis not present

## 2019-03-27 DIAGNOSIS — R42 Dizziness and giddiness: Secondary | ICD-10-CM | POA: Diagnosis not present

## 2019-03-27 HISTORY — PX: CATARACT EXTRACTION W/PHACO: SHX586

## 2019-03-27 LAB — GLUCOSE, CAPILLARY
Glucose-Capillary: 194 mg/dL — ABNORMAL HIGH (ref 70–99)
Glucose-Capillary: 203 mg/dL — ABNORMAL HIGH (ref 70–99)

## 2019-03-27 SURGERY — PHACOEMULSIFICATION, CATARACT, WITH IOL INSERTION
Anesthesia: Monitor Anesthesia Care | Site: Eye | Laterality: Right

## 2019-03-27 MED ORDER — NA CHONDROIT SULF-NA HYALURON 40-17 MG/ML IO SOLN
INTRAOCULAR | Status: DC | PRN
  Administered 2019-03-27: 1 mL via INTRAOCULAR

## 2019-03-27 MED ORDER — ONDANSETRON HCL 4 MG/2ML IJ SOLN: 4.0000 mg | Freq: Once | INTRAMUSCULAR | Status: DC | PRN

## 2019-03-27 MED ORDER — BRIMONIDINE TARTRATE-TIMOLOL 0.2-0.5 % OP SOLN
OPHTHALMIC | Status: DC | PRN
  Administered 2019-03-27: 1 [drp] via OPHTHALMIC

## 2019-03-27 MED ORDER — MIDAZOLAM HCL 2 MG/2ML IJ SOLN
INTRAMUSCULAR | Status: DC | PRN
  Administered 2019-03-27: 2 mg via INTRAVENOUS

## 2019-03-27 MED ORDER — TETRACAINE HCL 0.5 % OP SOLN
1.0000 [drp] | OPHTHALMIC | Status: DC | PRN
  Administered 2019-03-27 (×3): 1 [drp] via OPHTHALMIC

## 2019-03-27 MED ORDER — LACTATED RINGERS IV SOLN: INTRAVENOUS | Status: DC

## 2019-03-27 MED ORDER — ACETAMINOPHEN 160 MG/5ML PO SOLN: 325.0000 mg | ORAL | Status: DC | PRN

## 2019-03-27 MED ORDER — ARMC OPHTHALMIC DILATING DROPS
1.0000 "application " | OPHTHALMIC | Status: DC | PRN
  Administered 2019-03-27 (×3): 1 via OPHTHALMIC

## 2019-03-27 MED ORDER — FENTANYL CITRATE (PF) 100 MCG/2ML IJ SOLN
INTRAMUSCULAR | Status: DC | PRN
  Administered 2019-03-27: 50 ug via INTRAVENOUS

## 2019-03-27 MED ORDER — MOXIFLOXACIN HCL 0.5 % OP SOLN
OPHTHALMIC | Status: DC | PRN
  Administered 2019-03-27: 0.2 mL via OPHTHALMIC

## 2019-03-27 MED ORDER — LIDOCAINE HCL (PF) 2 % IJ SOLN
INTRAOCULAR | Status: DC | PRN
  Administered 2019-03-27: 08:00:00 1 mL

## 2019-03-27 MED ORDER — EPINEPHRINE PF 1 MG/ML IJ SOLN
INTRAOCULAR | Status: DC | PRN
  Administered 2019-03-27: 08:00:00 87 mL via OPHTHALMIC

## 2019-03-27 MED ORDER — ACETAMINOPHEN 325 MG PO TABS: 650.0000 mg | ORAL_TABLET | Freq: Once | ORAL | Status: DC | PRN

## 2019-03-27 SURGICAL SUPPLY — 21 items
CANNULA ANT/CHMB 27G (MISCELLANEOUS) ×1 IMPLANT
CANNULA ANT/CHMB 27GA (MISCELLANEOUS) ×3 IMPLANT
GLOVE SURG LX 8.0 MICRO (GLOVE) ×4
GLOVE SURG LX STRL 8.0 MICRO (GLOVE) ×1 IMPLANT
GLOVE SURG TRIUMPH 8.0 PF LTX (GLOVE) ×3 IMPLANT
GOWN STRL REUS W/ TWL LRG LVL3 (GOWN DISPOSABLE) ×2 IMPLANT
GOWN STRL REUS W/TWL LRG LVL3 (GOWN DISPOSABLE) ×4
LENS IOL TECNIS ITEC 18.0 (Intraocular Lens) ×2 IMPLANT
MARKER SKIN DUAL TIP RULER LAB (MISCELLANEOUS) ×3 IMPLANT
NDL FILTER BLUNT 18X1 1/2 (NEEDLE) ×1 IMPLANT
NDL RETROBULBAR .5 NSTRL (NEEDLE) ×3 IMPLANT
NEEDLE FILTER BLUNT 18X 1/2SAF (NEEDLE) ×2
NEEDLE FILTER BLUNT 18X1 1/2 (NEEDLE) ×1 IMPLANT
PACK EYE AFTER SURG (MISCELLANEOUS) ×3 IMPLANT
PACK OPTHALMIC (MISCELLANEOUS) ×3 IMPLANT
PACK PORFILIO (MISCELLANEOUS) ×3 IMPLANT
SUT ETHILON 10-0 CS-B-6CS-B-6 (SUTURE)
SUTURE EHLN 10-0 CS-B-6CS-B-6 (SUTURE) IMPLANT
SYR 3ML LL SCALE MARK (SYRINGE) ×3 IMPLANT
SYR TB 1ML LUER SLIP (SYRINGE) ×3 IMPLANT
WIPE NON LINTING 3.25X3.25 (MISCELLANEOUS) ×3 IMPLANT

## 2019-03-27 NOTE — H&P (Signed)
All labs reviewed. Abnormal studies sent to patients PCP when indicated.  Previous H&P reviewed, patient examined, there are NO CHANGES.  Jessica Furrow Porfilio6/23/20207:24 AM

## 2019-03-27 NOTE — Transfer of Care (Signed)
Immediate Anesthesia Transfer of Care Note  Patient: Jessica Montoya  Procedure(s) Performed: CATARACT EXTRACTION PHACO AND INTRAOCULAR LENS PLACEMENT ([facility])  RIGHT DIABETIC (Right Eye)  Patient Location: PACU  Anesthesia Type: MAC  Level of Consciousness: awake, alert  and patient cooperative  Airway and Oxygen Therapy: Patient Spontanous Breathing and Patient connected to supplemental oxygen  Post-op Assessment: Post-op Vital signs reviewed, Patient's Cardiovascular Status Stable, Respiratory Function Stable, Patent Airway and No signs of Nausea or vomiting  Post-op Vital Signs: Reviewed and stable  Complications: No apparent anesthesia complications

## 2019-03-27 NOTE — Anesthesia Procedure Notes (Signed)
Procedure Name: MAC Performed by: Chevette Fee M, CRNA Pre-anesthesia Checklist: Patient identified, Emergency Drugs available, Suction available, Timeout performed and Patient being monitored Patient Re-evaluated:Patient Re-evaluated prior to induction Oxygen Delivery Method: Nasal cannula Placement Confirmation: positive ETCO2       

## 2019-03-27 NOTE — Op Note (Signed)
PREOPERATIVE DIAGNOSIS:  Nuclear sclerotic cataract of the right eye.   POSTOPERATIVE DIAGNOSIS:  CATARACT   OPERATIVE PROCEDURE: Procedure(s): CATARACT EXTRACTION PHACO AND INTRAOCULAR LENS PLACEMENT ([place])  RIGHT DIABETIC   SURGEON:  Birder Robson, MD.   ANESTHESIA:  Anesthesiologist: Veda Canning, MD CRNA: Janna Arch, CRNA  1.      Managed anesthesia care. 2.      0.14ml of Shugarcaine was instilled in the eye following the paracentesis.   COMPLICATIONS:  None.   TECHNIQUE:   Stop and chop   DESCRIPTION OF PROCEDURE:  The patient was examined and consented in the preoperative holding area where the aforementioned topical anesthesia was applied to the right eye and then brought back to the Operating Room where the right eye was prepped and draped in the usual sterile ophthalmic fashion and a lid speculum was placed. A paracentesis was created with the side port blade and the anterior chamber was filled with viscoelastic. A near clear corneal incision was performed with the steel keratome. A continuous curvilinear capsulorrhexis was performed with a cystotome followed by the capsulorrhexis forceps. Hydrodissection and hydrodelineation were carried out with BSS on a blunt cannula. The lens was removed in a stop and chop  technique and the remaining cortical material was removed with the irrigation-aspiration handpiece. The capsular bag was inflated with viscoelastic and the Technis [id number]  lens was placed in the capsular bag without complication. The remaining viscoelastic was removed from the eye with the irrigation-aspiration handpiece. The wounds were hydrated. The anterior chamber was flushed with BSS and the eye was inflated to physiologic pressure. 0.27ml of Vigamox was placed in the anterior chamber. The wounds were found to be water tight. The eye was dressed with Combigan. The patient was given protective glasses to wear throughout the day and a shield with which to sleep  tonight. The patient was also given drops with which to begin a drop regimen today and will follow-up with me in one day. Implant Name Type Inv. Item Serial No. Manufacturer Lot No. LRB No. Used Action  LENS IOL DIOP 18.0 - [id number] Intraocular Lens LENS IOL DIOP 18.0 [id number] AMO  Right 1 Implanted   Procedure(s) with comments: CATARACT EXTRACTION PHACO AND INTRAOCULAR LENS PLACEMENT ([facility])  RIGHT DIABETIC (Right) - Diabetic - insulin  Electronically signed: Birder Robson 03/27/2019 7:51 AM

## 2019-03-27 NOTE — Anesthesia Preprocedure Evaluation (Signed)
Anesthesia Evaluation  Patient identified by MRN, date of birth, ID band Patient awake    Reviewed: Allergy & Precautions, NPO status , Patient's Chart, lab work & pertinent test results  History of Anesthesia Complications Negative for: history of anesthetic complications  Airway Mallampati: IV   Neck ROM: Full    Dental  (+) Lower Dentures, Upper Dentures   Pulmonary neg pulmonary ROS,    Pulmonary exam normal breath sounds clear to auscultation       Cardiovascular hypertension, Normal cardiovascular exam Rhythm:Regular Rate:Normal     Neuro/Psych PSYCHIATRIC DISORDERS Anxiety Depression Vertigo   Neuromuscular disease (neuropathy)    GI/Hepatic GERD  Medicated and Controlled,  Endo/Other  diabetes, Type 2, Insulin Dependent  Renal/GU Renal disease (stage II CKD)     Musculoskeletal  (+) Arthritis ,   Abdominal   Peds  Hematology negative hematology ROS (+)   Anesthesia Other Findings   Reproductive/Obstetrics                             Anesthesia Physical  Anesthesia Plan  ASA: III  Anesthesia Plan: MAC   Post-op Pain Management:    Induction: Intravenous  PONV Risk Score and Plan:   Airway Management Planned: Natural Airway and Nasal Cannula  Additional Equipment:   Intra-op Plan:   Post-operative Plan:   Informed Consent: I have reviewed the patients History and Physical, chart, labs and discussed the procedure including the risks, benefits and alternatives for the proposed anesthesia with the patient or authorized representative who has indicated his/her understanding and acceptance.       Plan Discussed with: CRNA  Anesthesia Plan Comments:         Anesthesia Quick Evaluation

## 2019-03-27 NOTE — Anesthesia Postprocedure Evaluation (Signed)
Anesthesia Post Note  Patient: Jessica Montoya  Procedure(s) Performed: CATARACT EXTRACTION PHACO AND INTRAOCULAR LENS PLACEMENT (IOC)  RIGHT DIABETIC (Right Eye)  Patient location during evaluation: PACU Anesthesia Type: MAC Level of consciousness: awake and alert Pain management: pain level controlled Vital Signs Assessment: post-procedure vital signs reviewed and stable Respiratory status: spontaneous breathing, nonlabored ventilation, respiratory function stable and patient connected to nasal cannula oxygen Cardiovascular status: stable and blood pressure returned to baseline Postop Assessment: no apparent nausea or vomiting Anesthetic complications: no    Veda Canning

## 2019-03-28 ENCOUNTER — Encounter: Payer: Self-pay | Admitting: Ophthalmology

## 2019-04-17 ENCOUNTER — Other Ambulatory Visit: Payer: Self-pay

## 2019-04-17 ENCOUNTER — Ambulatory Visit (INDEPENDENT_AMBULATORY_CARE_PROVIDER_SITE_OTHER): Payer: Medicare Other | Admitting: Nurse Practitioner

## 2019-04-17 ENCOUNTER — Encounter: Payer: Self-pay | Admitting: Nurse Practitioner

## 2019-04-17 VITALS — BP 94/59 | HR 92 | Temp 98.4°F

## 2019-04-17 DIAGNOSIS — G8929 Other chronic pain: Secondary | ICD-10-CM

## 2019-04-17 DIAGNOSIS — E1122 Type 2 diabetes mellitus with diabetic chronic kidney disease: Secondary | ICD-10-CM

## 2019-04-17 DIAGNOSIS — K219 Gastro-esophageal reflux disease without esophagitis: Secondary | ICD-10-CM

## 2019-04-17 DIAGNOSIS — E1149 Type 2 diabetes mellitus with other diabetic neurological complication: Secondary | ICD-10-CM

## 2019-04-17 DIAGNOSIS — M5442 Lumbago with sciatica, left side: Secondary | ICD-10-CM

## 2019-04-17 DIAGNOSIS — I10 Essential (primary) hypertension: Secondary | ICD-10-CM

## 2019-04-17 DIAGNOSIS — IMO0002 Reserved for concepts with insufficient information to code with codable children: Secondary | ICD-10-CM

## 2019-04-17 DIAGNOSIS — E1169 Type 2 diabetes mellitus with other specified complication: Secondary | ICD-10-CM

## 2019-04-17 DIAGNOSIS — M5441 Lumbago with sciatica, right side: Secondary | ICD-10-CM

## 2019-04-17 DIAGNOSIS — E1165 Type 2 diabetes mellitus with hyperglycemia: Secondary | ICD-10-CM

## 2019-04-17 DIAGNOSIS — F329 Major depressive disorder, single episode, unspecified: Secondary | ICD-10-CM

## 2019-04-17 DIAGNOSIS — Z1329 Encounter for screening for other suspected endocrine disorder: Secondary | ICD-10-CM

## 2019-04-17 DIAGNOSIS — F32A Depression, unspecified: Secondary | ICD-10-CM

## 2019-04-17 DIAGNOSIS — Z794 Long term (current) use of insulin: Secondary | ICD-10-CM

## 2019-04-17 DIAGNOSIS — E785 Hyperlipidemia, unspecified: Secondary | ICD-10-CM

## 2019-04-17 LAB — MICROALBUMIN, URINE WAIVED
Creatinine, Urine Waived: 50 mg/dL (ref 10–300)
Microalb, Ur Waived: 30 mg/L — ABNORMAL HIGH (ref 0–19)

## 2019-04-17 LAB — BAYER DCA HB A1C WAIVED: HB A1C (BAYER DCA - WAIVED): 7.3 % — ABNORMAL HIGH (ref <7.0)

## 2019-04-17 MED ORDER — CYCLOBENZAPRINE HCL 5 MG PO TABS: 5.0000 mg | ORAL_TABLET | Freq: Two times a day (BID) | ORAL | 0 refills | Status: DC | PRN

## 2019-04-17 MED ORDER — TRAMADOL HCL 50 MG PO TABS: 25.0000 mg | ORAL_TABLET | Freq: Four times a day (QID) | ORAL | 0 refills | Status: AC | PRN

## 2019-04-17 MED ORDER — GABAPENTIN 300 MG PO CAPS: 300.0000 mg | ORAL_CAPSULE | Freq: Two times a day (BID) | ORAL | 2 refills | Status: DC

## 2019-04-17 NOTE — Assessment & Plan Note (Addendum)
Chronic, ongoing, followed by endo with next visit in a few weeks per patient.  A1C today 7.3% and urine ALB 30 A:C 30-300.  Continue current regimen at this time and collaboration with endo.  Referral to CCM team to discuss current medication regimen, possibility of assistance and transition to alternate medication regimen to avoid hypoglycemia and is concern with aging.

## 2019-04-17 NOTE — Patient Instructions (Signed)
Diclofenac gel (Voltaren gel)   Acute Back Pain, Adult Acute back pain is sudden and usually short-lived. It is often caused by an injury to the muscles and tissues in the back. The injury may result from:  A muscle or ligament getting overstretched or torn (strained). Ligaments are tissues that connect bones to each other. Lifting something improperly can cause a back strain.  Wear and tear (degeneration) of the spinal disks. Spinal disks are circular tissue that provides cushioning between the bones of the spine (vertebrae).  Twisting motions, such as while playing sports or doing yard work.  A hit to the back.  Arthritis. You may have a physical exam, lab tests, and imaging tests to find the cause of your pain. Acute back pain usually goes away with rest and home care. Follow these instructions at home: Managing pain, stiffness, and swelling  Take over-the-counter and prescription medicines only as told by your health care provider.  Your health care provider may recommend applying ice during the first 24-48 hours after your pain starts. To do this: ? Put ice in a plastic bag. ? Place a towel between your skin and the bag. ? Leave the ice on for 20 minutes, 2-3 times a day.  If directed, apply heat to the affected area as often as told by your health care provider. Use the heat source that your health care provider recommends, such as a moist heat pack or a heating pad. ? Place a towel between your skin and the heat source. ? Leave the heat on for 20-30 minutes. ? Remove the heat if your skin turns bright red. This is especially important if you are unable to feel pain, heat, or cold. You have a greater risk of getting burned. Activity   Do not stay in bed. Staying in bed for more than 1-2 days can delay your recovery.  Sit up and stand up straight. Avoid leaning forward when you sit, or hunching over when you stand. ? If you work at a desk, sit close to it so you do not need  to lean over. Keep your chin tucked in. Keep your neck drawn back, and keep your elbows bent at a right angle. Your arms should look like the letter "L." ? Sit high and close to the steering wheel when you drive. Add lower back (lumbar) support to your car seat, if needed.  Take short walks on even surfaces as soon as you are able. Try to increase the length of time you walk each day.  Do not sit, drive, or stand in one place for more than 30 minutes at a time. Sitting or standing for long periods of time can put stress on your back.  Do not drive or use heavy machinery while taking prescription pain medicine.  Use proper lifting techniques. When you bend and lift, use positions that put less stress on your back: ? Garfield your knees. ? Keep the load close to your body. ? Avoid twisting.  Exercise regularly as told by your health care provider. Exercising helps your back heal faster and helps prevent back injuries by keeping muscles strong and flexible.  Work with a physical therapist to make a safe exercise program, as recommended by your health care provider. Do any exercises as told by your physical therapist. Lifestyle  Maintain a healthy weight. Extra weight puts stress on your back and makes it difficult to have good posture.  Avoid activities or situations that make you feel anxious or  stressed. Stress and anxiety increase muscle tension and can make back pain worse. Learn ways to manage anxiety and stress, such as through exercise. General instructions  Sleep on a firm mattress in a comfortable position. Try lying on your side with your knees slightly bent. If you lie on your back, put a pillow under your knees.  Follow your treatment plan as told by your health care provider. This may include: ? Cognitive or behavioral therapy. ? Acupuncture or massage therapy. ? Meditation or yoga. Contact a health care provider if:  You have pain that is not relieved with rest or medicine.   You have increasing pain going down into your legs or buttocks.  Your pain does not improve after 2 weeks.  You have pain at night.  You lose weight without trying.  You have a fever or chills. Get help right away if:  You develop new bowel or bladder control problems.  You have unusual weakness or numbness in your arms or legs.  You develop nausea or vomiting.  You develop abdominal pain.  You feel faint. Summary  Acute back pain is sudden and usually short-lived.  Use proper lifting techniques. When you bend and lift, use positions that put less stress on your back.  Take over-the-counter and prescription medicines and apply heat or ice as directed by your health care provider. This information is not intended to replace advice given to you by your health care provider. Make sure you discuss any questions you have with your health care provider. Document Released: 09/20/2005 Document Revised: 01/09/2019 Document Reviewed: 05/04/2017 Elsevier Patient Education  2020 [employer].

## 2019-04-17 NOTE — Assessment & Plan Note (Signed)
MRI in 2014 noting DDD.  Discussed with patient possibility of intermittent episodes of pain and benefit of PT, she agrees to referral.  Script for Flexeril and Tramadol at lowest doses sent and only short term supply, educated patient not to take these with driving or other medications at night.  Also recommend not taking at same time and to utilize only minimally.  Recommend use of Diclofenac gel + alternating heat/ice.  Return for worsening or continued issues.

## 2019-04-17 NOTE — Assessment & Plan Note (Addendum)
Chronic, ongoing.  Continue current medication regimen at this time.  Consider reduction next visit and change from Prozac and Amitriptyline to Cymbalta which would benefit mood and pain level.  Denies SI/HI.

## 2019-04-17 NOTE — Progress Notes (Addendum)
BP (!) 94/59    Pulse 92    Temp 98.4 F (36.9 C) (Oral)    LMP  (LMP Unknown)    SpO2 96%    Subjective:    Patient ID: Jessica Montoya, female    DOB: 1951/05/29, 68 y.o.   MRN: [id number]  HPI: Jessica Montoya is a 68 y.o. female  Chief Complaint  Patient presents with   Depression   Diabetes   Hyperlipidemia   Hypertension   Back Pain    pt states she has had worsening back pain for the past 2 months   DIABETES Is followed by Dr. [name], last saw her in March.  Taking U500 65 units TID.  Has tried Metformin (GI upset), only oral medication she has tried (it is on last endo note as continuing to take, but patient reports not taking this ).  Has not tried weekly injectables.  On review of [facility] notes her A1C was 5.0% last in May but she did not attend follow-up in May and last saw Dr. [name] in March, at baseline runs 9-12 ranges and today 7.3%.  On review of note and discussion with patient cost is an issue with medications.  Discussed referral to CCM team and she is interested in this. Hypoglycemic episodes:yes, once and awhile has 50's Polydipsia/polyuria: no Visual disturbance: no Chest pain: no Paresthesias: no Glucose Monitoring: yes  Accucheck frequency: TID  Fasting glucose: 200's range  Post prandial: at 2 pm averages 160-170  Evening: at 8 pm ranges 76 to 220  Before meals:  Taking Insulin?: yes  Long acting insulin: U 500 65 units TID  Short acting insulin: Blood Pressure Monitoring: rarely Retinal Examination: Not up to Date Foot Exam: Up to Date Pneumovax: Up to Date Influenza: Up to Date Aspirin: yes   HYPERTENSION / HYPERLIPIDEMIA Continues on Ramipril and Simvastatin.  Satisfied with current treatment? yes Duration of hypertension: chronic BP monitoring frequency: rarely BP range: 90-100/60's at home BP medication side effects: no Duration of hyperlipidemia: chronic Cholesterol medication side effects: no Cholesterol supplements:  none Medication compliance: good compliance Aspirin: no Recent stressors: no Recurrent headaches: no Visual changes: no Palpitations: no Dyspnea: no Chest pain: no Lower extremity edema: no Dizzy/lightheaded: no  DEPRESSION Continues on Amitriptyline 50 MG, Prozac 20 MG, Trazodone 50 MG.  She would like to minimize medications, discussed CCM referral.  Discussed alternate medication regimen options for mood and pain in future, such as Cymbalta and tapering off Prozac and Amitriptyline. Mood status: controlled Satisfied with current treatment?: yes Symptom severity: mild  Duration of current treatment : chronic Side effects: no Medication compliance: good compliance Psychotherapy/counseling: none Depressed mood: no Anxious mood: no Anhedonia: no Significant weight loss or gain: no Insomnia: yes hard to fall asleep Fatigue: yes Feelings of worthlessness or guilt: no Impaired concentration/indecisiveness: no Suicidal ideations: no Hopelessness: no Crying spells: no Depression screen [facility] 2/9 04/17/2019 06/01/2018 09/28/2017 09/28/2017 09/28/2017  Decreased Interest 0 0 1 1 1   Down, Depressed, Hopeless 0 0 0 0 0  PHQ - 2 Score 0 0 1 1 1   Altered sleeping 3 - 3 3 -  Tired, decreased energy 3 - 3 3 -  Change in appetite 0 - 3 3 -  Feeling bad or failure about yourself  0 - 1 1 -  Trouble concentrating 0 - 1 1 -  Moving slowly or fidgety/restless 0 - 1 1 -  Suicidal thoughts 0 - 0 0 -  PHQ-9 Score  6 - 13 13 -  Difficult doing work/chores Not difficult at all - - - -    BACK PAIN Has been present for two weeks.  States she has had similar issues before, last about one year ago.  Had imaging done 2014 of lower back with no abnormalities seen and MRI  "Multilevel disc disease and small disc protrusions as  described above. No high-grade spinal stenosis or prominent neural foraminal".  In past muscle relaxer and short period of pain medication have helped per her report.  Has not  done PT.  Is taking Gabapentin 300 MG BID (most recent CrCl based on August 2019 labs == 42.11). narrowing noted.  Duration: weeks Mechanism of injury: no trauma Location: low back Onset: gradual Severity: 9/10 at worst and at best 3/10 Quality: dull, aching and shooting Frequency: constant Radiation: R leg above the knee and L leg above the knee Aggravating factors: standing, movement, walking and bending Alleviating factors: laying Status: fluctuating Treatments attempted: rest and APAP  Relief with NSAIDs?: No NSAIDs Taken Nighttime pain:  no Paresthesias / decreased sensation:  no Bowel / bladder incontinence:  no Fevers:  no Dysuria / urinary frequency:  no  Relevant past medical, surgical, family and social history reviewed and updated as indicated. Interim medical history since our last visit reviewed. Allergies and medications reviewed and updated.  Review of Systems  Constitutional: Negative for activity change, appetite change, diaphoresis, fatigue and fever.  Respiratory: Negative for cough, chest tightness and shortness of breath.   Cardiovascular: Negative for chest pain, palpitations and leg swelling.  Gastrointestinal: Negative for abdominal distention, abdominal pain, constipation, diarrhea, nausea and vomiting.  Endocrine: Negative for cold intolerance, heat intolerance, polydipsia, polyphagia and polyuria.  Musculoskeletal: Positive for back pain.  Neurological: Negative for dizziness, syncope, weakness, light-headedness, numbness and headaches.  Psychiatric/Behavioral: Negative.     Per HPI unless specifically indicated above     Objective:    BP (!) 94/59    Pulse 92    Temp 98.4 F (36.9 C) (Oral)    LMP  (LMP Unknown)    SpO2 96%   Wt Readings from Last 3 Encounters:  03/27/19 176 lb (79.8 kg)  02/20/19 172 lb (78 kg)  08/15/18 150 lb 8 oz (68.3 kg)    Physical Exam Vitals signs and nursing note reviewed.  Constitutional:      General: She is  awake. She is not in acute distress.    Appearance: She is well-developed. She is not ill-appearing.  HENT:     Head: Normocephalic.     Right Ear: Hearing normal.     Left Ear: Hearing normal.     Nose: Nose normal.     Mouth/Throat:     Mouth: Mucous membranes are moist.  Eyes:     General: Lids are normal.        Right eye: No discharge.        Left eye: No discharge.     Conjunctiva/sclera: Conjunctivae normal.     Pupils: Pupils are equal, round, and reactive to light.  Neck:     Musculoskeletal: Normal range of motion and neck supple.     Thyroid: No thyromegaly.     Vascular: No carotid bruit or JVD.  Cardiovascular:     Rate and Rhythm: Normal rate and regular rhythm.     Heart sounds: Normal heart sounds. No murmur. No gallop.   Pulmonary:     Effort: Pulmonary effort is normal. No accessory  muscle usage or respiratory distress.     Breath sounds: Normal breath sounds.  Abdominal:     General: Bowel sounds are normal.     Palpations: Abdomen is soft. There is no hepatomegaly or splenomegaly.  Musculoskeletal:     Lumbar back: She exhibits decreased range of motion, tenderness (mild) and pain. She exhibits no bony tenderness, no swelling, no edema and no spasm.     Right lower leg: No edema.     Left lower leg: No edema.     Comments: Negative straight leg testing.  Mild tenderness on palpation of lower back.  Decrease ROM lower back with flexion (can bend to just above knee before pain present) and extension.  No decrease in ROM with lateral or rotation.  Significant discomfort noted getting up onto exam table from step, needed assist.  Lymphadenopathy:     Cervical: No cervical adenopathy.  Skin:    General: Skin is warm and dry.  Neurological:     Mental Status: She is alert and oriented to person, place, and time.  Psychiatric:        Attention and Perception: Attention normal.        Mood and Affect: Mood normal.        Behavior: Behavior normal. Behavior is  cooperative.        Thought Content: Thought content normal.        Judgment: Judgment normal.     Results for orders placed or performed in visit on 04/17/19  Bayer DCA Hb A1c Waived  Result Value Ref Range   HB A1C (BAYER DCA - WAIVED) 7.3 (H) <7.0 %  Microalbumin, Urine Waived  Result Value Ref Range   Microalb, Ur Waived 30 (H) 0 - 19 mg/L   Creatinine, Urine Waived 50 10 - 300 mg/dL   Microalb/Creat Ratio 30-300 (H) <30 mg/g      Assessment & Plan:   Problem List Items Addressed This Visit      Cardiovascular and Mediastinum   Hypertension    Chronic, ongoing.  Continue current medication regimen, Ramipril at low dose for kidney protection and recommend good hydration at home due to occasional lower BP readings (lower in office today).  Labs today.       Relevant Orders   Comprehensive metabolic panel     Digestive   GERD (gastroesophageal reflux disease)   Relevant Orders   Magnesium     Endocrine   Hyperlipidemia associated with type 2 diabetes mellitus (HCC)    Chronic, ongoing.  Continue current medication regimen and adjust as needed.  Lipid panel and CMP today.      Relevant Medications   HUMULIN R U-500 KWIKPEN 500 UNIT/ML kwikpen   Uncontrolled type 2 diabetes mellitus with chronic kidney disease ([place]) - Primary    Chronic, ongoing, followed by endo with next visit in a few weeks per patient.  A1C today 7.3% and urine ALB 30 A:C 30-300.  Continue current regimen at this time and collaboration with endo.  Referral to CCM team to discuss current medication regimen, possibility of assistance and transition to alternate medication regimen to avoid hypoglycemia and is concern with aging.      Relevant Medications   HUMULIN R U-500 KWIKPEN 500 UNIT/ML kwikpen   Other Relevant Orders   Bayer DCA Hb A1c Waived (Completed)   Ambulatory referral to Chronic Care Management Services     Nervous and Auditory   Chronic bilateral low back pain with bilateral  sciatica  MRI in 2014 noting DDD.  Discussed with patient possibility of intermittent episodes of pain and benefit of PT, she agrees to referral.  Script for Flexeril and Tramadol at lowest doses sent and only short term supply, educated patient not to take these with driving or other medications at night.  Also recommend not taking at same time and to utilize only minimally.  Recommend use of Diclofenac gel + alternating heat/ice.  Return for worsening or continued issues.      Relevant Medications   gabapentin (NEURONTIN) 300 MG capsule   cyclobenzaprine (FLEXERIL) 5 MG tablet   traMADol (ULTRAM) 50 MG tablet   Other Relevant Orders   Ambulatory referral to Physical Therapy   RESOLVED: Type II diabetes mellitus with neurological manifestations (HCC)   Relevant Medications   HUMULIN R U-500 KWIKPEN 500 UNIT/ML kwikpen   Other Relevant Orders   Bayer DCA Hb A1c Waived (Completed)   Comprehensive metabolic panel   Lipid Panel w/o Chol/HDL Ratio   Microalbumin, Urine Waived (Completed)     Other   Depression    Chronic, ongoing.  Continue current medication regimen at this time.  Consider reduction next visit and change from Prozac and Amitriptyline to Cymbalta which would benefit mood and pain level.  Denies SI/HI.      Long-term insulin use ([place])    CCM referral placed to discuss alternative regimens.       Other Visit Diagnoses    Thyroid disorder screen       TSH today   Relevant Orders   TSH      Controlled substance.  Checked data base and no other refills of controlled substances noted.  No fills since 2018.  Follow up plan: Return in about 4 weeks (around 05/15/2019) for Back pain follow-up + 3 month diabetes follow-up.

## 2019-04-17 NOTE — Assessment & Plan Note (Signed)
Chronic, ongoing.  Continue current medication regimen and adjust as needed.  Lipid panel and CMP today.    

## 2019-04-17 NOTE — Assessment & Plan Note (Signed)
Chronic, ongoing.  Continue current medication regimen, Ramipril at low dose for kidney protection and recommend good hydration at home due to occasional lower BP readings (lower in office today).  Labs today.

## 2019-04-17 NOTE — Assessment & Plan Note (Signed)
CCM referral placed to discuss alternative regimens.

## 2019-04-18 LAB — MAGNESIUM: Magnesium: 1.7 mg/dL (ref 1.6–2.3)

## 2019-04-18 LAB — LIPID PANEL W/O CHOL/HDL RATIO
Cholesterol, Total: 152 mg/dL (ref 100–199)
HDL: 58 mg/dL (ref >39)
LDL Calculated: 54 mg/dL (ref 0–99)
Triglycerides: 202 mg/dL — ABNORMAL HIGH (ref 0–149)
VLDL Cholesterol Cal: 40 mg/dL (ref 5–40)

## 2019-04-18 LAB — COMPREHENSIVE METABOLIC PANEL
ALT: 21 IU/L (ref 0–32)
AST: 13 IU/L (ref 0–40)
Albumin/Globulin Ratio: 1.7 (ref 1.2–2.2)
Albumin: 4.6 g/dL (ref 3.8–4.8)
Alkaline Phosphatase: 79 IU/L (ref 39–117)
BUN/Creatinine Ratio: 15 (ref 12–28)
BUN: 22 mg/dL (ref 8–27)
Bilirubin Total: 0.7 mg/dL (ref 0.0–1.2)
CO2: 22 mmol/L (ref 20–29)
Calcium: 9.9 mg/dL (ref 8.7–10.3)
Chloride: 93 mmol/L — ABNORMAL LOW (ref 96–106)
Creatinine, Ser: 1.5 mg/dL — ABNORMAL HIGH (ref 0.57–1.00)
GFR calc Af Amer: 41 mL/min/{1.73_m2} — ABNORMAL LOW (ref >59)
GFR calc non Af Amer: 36 mL/min/{1.73_m2} — ABNORMAL LOW (ref >59)
Globulin, Total: 2.7 g/dL (ref 1.5–4.5)
Glucose: 495 mg/dL — ABNORMAL HIGH (ref 65–99)
Potassium: 5.3 mmol/L — ABNORMAL HIGH (ref 3.5–5.2)
Sodium: 133 mmol/L — ABNORMAL LOW (ref 134–144)
Total Protein: 7.3 g/dL (ref 6.0–8.5)

## 2019-04-18 LAB — TSH: TSH: 2.76 u[IU]/mL (ref 0.450–4.500)

## 2019-05-01 ENCOUNTER — Ambulatory Visit: Payer: Medicare Other | Attending: Nurse Practitioner | Admitting: Physical Therapy

## 2019-05-01 ENCOUNTER — Encounter: Payer: Self-pay | Admitting: Physical Therapy

## 2019-05-01 DIAGNOSIS — R293 Abnormal posture: Secondary | ICD-10-CM | POA: Insufficient documentation

## 2019-05-01 DIAGNOSIS — G8929 Other chronic pain: Secondary | ICD-10-CM | POA: Diagnosis present

## 2019-05-01 DIAGNOSIS — M545 Low back pain: Secondary | ICD-10-CM | POA: Diagnosis present

## 2019-05-01 DIAGNOSIS — M6281 Muscle weakness (generalized): Secondary | ICD-10-CM | POA: Diagnosis present

## 2019-05-01 NOTE — Therapy (Signed)
[facility] [address] [place], [place], [address] Phone: [phone]   Fax:  [phone]  Physical Therapy Evaluation  Patient Details  Name: Jessica Montoya MRN: [id number] Date of Birth: 1951/10/04 Referring Provider (PT): [name], NP   Encounter Date: 05/01/2019  PT End of Session - 05/01/19 1527    Visit Number  1    Number of Visits  17    Date for PT Re-Evaluation  06/26/19    Authorization Type  05/01/2019 eval    Authorization Time Period  1/10    PT Start Time  1322    PT Stop Time  1403    PT Time Calculation (min)  41 min    Activity Tolerance  Patient tolerated treatment well;Patient limited by pain    Behavior During Therapy  [facility] for tasks assessed/performed       Past Medical History:  Diagnosis Date  . Anxiety   . Arthritis    knees  . Chronic pain   . CKD (chronic kidney disease), stage II   . Depression   . Diabetes mellitus without complication ([place])   . Diabetic neuropathy ([place])   . Fatigue    a. 05/2018 Echo: EF 60-65%, no rwma, nl RV fxn; b. 05/2018 Lexiscan MV: EF>65%. No ischemia/infarct.  . Hyperlipidemia   . Hypertension   . Insomnia   . Long term current use of insulin ([place])   . Peripheral neuropathy   . Vertigo    2x/month  . Wears dentures    full upper and lower    Past Surgical History:  Procedure Laterality Date  . ABDOMINAL HYSTERECTOMY    . CATARACT EXTRACTION W/PHACO Left 02/20/2019   Procedure: CATARACT EXTRACTION PHACO AND INTRAOCULAR LENS PLACEMENT ([place]) LEFT DIABETES;  Surgeon: Birder Robson, MD;  Location: [place];  Service: Ophthalmology;  Laterality: Left;  . CATARACT EXTRACTION W/PHACO Right 03/27/2019   Procedure: CATARACT EXTRACTION PHACO AND INTRAOCULAR LENS PLACEMENT ([place])  RIGHT DIABETIC;  Surgeon: Birder Robson, MD;  Location: [place];  Service: Ophthalmology;  Laterality: Right;  Diabetic - insulin  . COLON SURGERY       There were no vitals filed for this visit.   [facility] PT Assessment - 05/01/19 1326      Assessment   Medical Diagnosis  Chronic B LBP    Referring Provider (PT)  [name], NP       SUBJECTIVE Chief complaint:  Patient presents to clinic 20 minutes late for appointment 2/2 to direction/navigation difficulties. Patient reports having low back pain that is diffuse and has radiating s/s to the front of both legs (R>L). Occasionally having pain starting in the neck. Last major fall noted 2 years ago down an escalator due to vertigo; last fall at home 6 months ago. Onset: 2014 MRI noting DDD Referring Dx: Chronic LBP with bilateral sciatica MD: [name], NP Pain: 6/10 Present, 3/10 Best, 10/10 Worst: Aggravating factors: sitting, bending Easing factors: muscle relaxant, tylenol 24 hour pain behavior: stiffer at night How long can you sit: 6 min How long can you stand: unable to stand in one place How long can you walk: 10 min Recent back trauma: No Prior history of back injury or pain: Yes Pain quality: pain quality: cramping and shooting Radiating pain: Yes  Numbness/Tingling: Yes Follow-up appointment with MD: No Dominant hand: right Imaging: Yes 2014: DDD    OBJECTIVE (Information below may be limited due to time constraints and  late patient arrival)  Mental Status Patient is oriented to person, place and time.  Recent memory is intact.  Remote memory is intact.  Attention span and concentration are intact.  Expressive speech is intact.  Patient's fund of knowledge is within normal limits for educational level.  SENSATION: Grossly intact to light touch bilateral LEs as determined by testing dermatomes L2-S2 Proprioception and hot/cold testing deferred on this date   MUSCULOSKELETAL: Tremor: None Bulk: Normal Tone: Normal  Posture Slight R lateral trunk lean; increased hip adduction in sitting and standing  Gait LLE held in external rotation; patient  ambulates without AD but seeks fingertip/hand external support intermittently. Patient hsa limited foot clearance bilaterally and mild trunk lean to the R.    Strength (out of 5) R/L 3/3 Hip flexion 4/4 Hip abduction 4/4 Hip adduction 4/4 Knee extension 3+/3+ Knee flexion 3/3+ Ankle dorsiflexion All movements lacked smooth quality; unclear if this was due to lack of strength or confusion about directions.  Five Time Sit to Stand 33.9 seconds, BUE use, with 2 LOB/uncontrolled descents 2/2 to vertigo   AROM (degrees): deferred due to time; to be assessed at next visit R/L (all movements include overpressure unless otherwise stated) Lumbar forward flexion (0-65):  Lumbar extension (0-30): Lumbar lateral flexion (0-25): R:  L:  Lumbar rotation:  Hip IR (0-45): R: L: Hip ER (0-45): R: L: Hip Flexion (0-125):  Hip Abduction (0-40): R: L: Hip extension (0-15): R: L: *Indicates pain  Repeated Movements:deferred due to time; to be assessed at next visit No centralization or peripheralization of symptoms with repeated lumbar extension or flexion.    Muscle Length:deferred due to time; to be assessed at next visit Hamstrings: R: degrees L: degrees    Passive Accessory Intervertebral Motion (PAIVM): deferred due to time; to be assessed at next visit Pt denies reproduction of back pain with CPA L1-L5 and UPA bilaterally L1-L5. Generally hypomobile throughout  Passive Physiological Intervertebral Motion (PPIVM): deferred due to time; to be assessed at next visit Normal flexion and extension with PPIVM testing   SPECIAL TESTS: deferred due to time; to be assessed at next visit     ASSESSMENT Patient is a 68 year old presenting to clinic with chief complaints of chronic bilateral low back pain with radiating symptoms to both legs. Upon examination, patient demonstrates deficits in posture, strength, pain, spinal mobility, and overall function as evidenced by Five Time Sit to Stand of  33.9 seconds, gross BLE 3+/5, and worst pain NPRS of 10/10 over the past 7 days. Patient's responses on mODI outcome measures (66%) indicate significant functional limitations/disability/distress. Patient's progress may be limited due to chronicity and comorbidities; however, patient's motivation is advantageous. Patient was able to achieve basic understanding of diaphragmatic breathing for pain modulation during today's evaluation and responded positively to educational interventions. Patient will benefit from continued skilled therapeutic intervention to address deficits in posture, strength, pain, spinal mobility, and overall function in order to increase function, and improve overall QOL.       Objective measurements completed on examination: See above findings.        PT Long Term Goals - 05/01/19 1518      PT LONG TERM GOAL #1   Title  Pt will decrease mODI scoreby at least 13 points in order demonstrate clinically significant reduction in back pain/disability.    Baseline  IE: 66% (33/50)    Time  8    Period  Weeks    Status  New  Target Date  06/26/19      PT LONG TERM GOAL #2   Title  Pt will decrease worst back pain over a given 7 day period as reported on NPRS by at least 2 points in order to demonstrate clinically significant reduction in back pain.    Baseline  IE: 10/10    Time  8    Period  Weeks    Status  New    Target Date  06/26/19      PT LONG TERM GOAL #3   Title  Patient will demonstrate clinically significant improved BLE strength as evidenced by a 5TSTS improvement >/= 3 seconds without use of BUE in order to improve function.    Baseline  IE: 33.9 sec, BUE, 2 LOB    Time  8    Period  Weeks    Status  New    Target Date  06/26/19      PT LONG TERM GOAL #4   Title  Patient will report improved tolerance to walking/standing activity for duration of > 20 min to ensure safe and pain reduced function in the community and at home.    Baseline  IE: 10  min    Time  8    Period  Weeks    Status  New    Target Date  06/26/19             Plan - 05/01/19 1323    Clinical Impression Statement  Patient is a 68 year old presenting to clinic with chief complaints of chronic bilateral low back pain with radiating symptoms to both legs. Upon examination, patient demonstrates deficits in posture, strength, pain, spinal mobility, and overall function as evidenced by Five Time Sit to Stand of 33.9 seconds, gross BLE 3+/5, and worst pain NPRS of 10/10 over the past 7 days. Patient's responses on mODI outcome measures (66%) indicate significant functional limitations/disability/distress. Patient's progress may be limited due to chronicity and comorbidities; however, patient's motivation is advantageous. Patient was able to achieve basic understanding of diaphragmatic breathing for pain modulation during today's evaluation and responded positively to educational interventions. Patient will benefit from continued skilled therapeutic intervention to address deficits in posture, strength, pain, spinal mobility, and overall function in order to increase function, and improve overall QOL.    Personal Factors and Comorbidities  Age;Comorbidity 3+;Past/Current Experience;Time since onset of injury/illness/exacerbation;Sex;Behavior Pattern    Comorbidities  anxiety, arthritis, peripheral neuropathy, chronic kidney disease, DM    Examination-Activity Limitations  Squat;Lift;Stairs;Stand;Locomotion Level;Sit;Sleep    Examination-Participation Restrictions  Laundry;Shop;Yard Work;Driving;Community Activity;Cleaning    Stability/Clinical Decision Making  Evolving/Moderate complexity    Clinical Decision Making  Moderate    Rehab Potential  Fair    PT Frequency  2x / week    PT Duration  8 weeks    PT Treatment/Interventions  ADLs/Self Care Home Management;Aquatic Therapy;Cryotherapy;Electrical Stimulation;Moist Heat;Gait training;Stair training;Functional mobility  training;Therapeutic activities;Therapeutic exercise;Balance training;Neuromuscular re-education;Patient/family education;Manual techniques;Taping;Spinal Manipulations;Joint Manipulations;Dry needling;Scar mobilization    PT Home Exercise Plan  Access Code: [email or web address]    Consulted and Agree with Plan of Care  Patient       Patient will benefit from skilled therapeutic intervention in order to improve the following deficits and impairments:  Abnormal gait, Decreased balance, Decreased endurance, Decreased mobility, Difficulty walking, Improper body mechanics, Decreased activity tolerance, Decreased coordination, Pain, Postural dysfunction, Impaired flexibility, Decreased strength  Visit Diagnosis: 1. Chronic bilateral low back pain, unspecified whether sciatica present   2. Abnormal posture  3. Muscle weakness (generalized)        Problem List Patient Active Problem List   Diagnosis Date Noted  . Chronic bilateral low back pain with bilateral sciatica 04/17/2019  . Osteoarthritis 11/09/2017  . Anemia 09/28/2017  . Chronic kidney disease, stage 3 ([place]) 05/02/2017  . Closed fracture of left patella 04/27/2017  . Advanced care planning/counseling discussion 11/24/2016  . Insomnia 07/02/2015  . Depression 05/21/2015  . Hypertension 05/21/2015  . Diabetic neuropathy ([place]) 05/21/2015  . Hyperlipidemia associated with type 2 diabetes mellitus ([place]) 05/21/2015  . Chronic pain syndrome 05/21/2015  . Primary insomnia 05/21/2015  . GERD (gastroesophageal reflux disease) 05/21/2015  . Uncontrolled type 2 diabetes mellitus with chronic kidney disease ([place]) 05/21/2015  . Long-term insulin use ([place]) 08/16/2014  . Type 2 diabetes mellitus with microalbuminuria ([place]) 08/16/2014   Myles Gip PT, DPT [id number] 05/01/2019, 3:28 PM  [place] [address] [place], [place], [address] Phone: [phone]   Fax:  [phone]  Name: Zyriah Mask MRN: [id number] Date of Birth: 26-Apr-1951

## 2019-05-03 ENCOUNTER — Ambulatory Visit: Payer: Medicare Other | Admitting: Physical Therapy

## 2019-05-03 ENCOUNTER — Other Ambulatory Visit: Payer: Self-pay

## 2019-05-03 ENCOUNTER — Encounter: Payer: Self-pay | Admitting: Physical Therapy

## 2019-05-03 DIAGNOSIS — M6281 Muscle weakness (generalized): Secondary | ICD-10-CM

## 2019-05-03 DIAGNOSIS — M545 Low back pain: Secondary | ICD-10-CM | POA: Diagnosis not present

## 2019-05-03 DIAGNOSIS — R293 Abnormal posture: Secondary | ICD-10-CM

## 2019-05-03 DIAGNOSIS — G8929 Other chronic pain: Secondary | ICD-10-CM

## 2019-05-03 NOTE — Therapy (Signed)
[place] [facility] [address] [place], [place], [address] Phone: [phone]   Fax:  [phone]  Physical Therapy Treatment  Patient Details  Name: Jessica Montoya MRN: [id number] Date of Birth: Jan 25, 1951 Referring Provider (PT): [name], NP   Encounter Date: 05/03/2019  PT End of Session - 05/03/19 3875    Visit Number  2    Number of Visits  17    Date for PT Re-Evaluation  06/26/19    Authorization Type  05/01/2019 eval    Authorization Time Period  2/10    PT Start Time  0900    PT Stop Time  0945    PT Time Calculation (min)  45 min    Activity Tolerance  Patient tolerated treatment well;Patient limited by pain;Other (comment)   Patient limited by increased vertigo   Behavior During Therapy  [facility] for tasks assessed/performed       Past Medical History:  Diagnosis Date  . Anxiety   . Arthritis    knees  . Chronic pain   . CKD (chronic kidney disease), stage II   . Depression   . Diabetes mellitus without complication ([place])   . Diabetic neuropathy ([place])   . Fatigue    a. 05/2018 Echo: EF 60-65%, no rwma, nl RV fxn; b. 05/2018 Lexiscan MV: EF>65%. No ischemia/infarct.  . Hyperlipidemia   . Hypertension   . Insomnia   . Long term current use of insulin ([place])   . Peripheral neuropathy   . Vertigo    2x/month  . Wears dentures    full upper and lower    Past Surgical History:  Procedure Laterality Date  . ABDOMINAL HYSTERECTOMY    . CATARACT EXTRACTION W/PHACO Left 02/20/2019   Procedure: CATARACT EXTRACTION PHACO AND INTRAOCULAR LENS PLACEMENT ([place]) LEFT DIABETES;  Surgeon: Birder Robson, MD;  Location: [place];  Service: Ophthalmology;  Laterality: Left;  . CATARACT EXTRACTION W/PHACO Right 03/27/2019   Procedure: CATARACT EXTRACTION PHACO AND INTRAOCULAR LENS PLACEMENT ([place])  RIGHT DIABETIC;  Surgeon: Birder Robson, MD;  Location: [place];  Service: Ophthalmology;   Laterality: Right;  Diabetic - insulin  . COLON SURGERY      There were no vitals filed for this visit.  Subjective Assessment - 05/03/19 0902    Subjective  Patient states that when she woke up she felt okay/pretty good, but as soon as she got up and started moving, her back pain set in.    Currently in Pain?  Yes    Pain Score  6     Pain Location  Back    Pain Orientation  Lower    Pain Descriptors / Indicators  Tingling    Pain Radiating Towards  Up to shoulders    Pain Onset  More than a month ago    Pain Frequency  Intermittent       TREATMENT  Pre-treatment assessment: Lumbar AROM limited in all directions; none causing concordant pain, lumbar flexion increased stretch, extension increased dizziness  Manual Therapy: STM and TPR performed to B paraspinals and superior gluteals to allow for decreased tension and pain and improved posture and function Mobilizations with movement for decreased spasm and improved mobility, grade I, innominate posterior rotations   Neuromuscular Re-education: SPC adjusted for patient height and gait assessed in clinic; decreased lateral lean with adjustment more symmetrical stride and improved overall posture Sidelying diaphragmatic breathing for nervous system downregulation Sidelying single knee to chest stretch, 2  min hold, B, for improved pelvic posture and decreased muscle spasm Sidelying thoracic rotation, x10 reps with coordinated breath, B, for improved spinal mobility and decreased muscle spasm   Patient educated throughout session on appropriate technique and form using multi-modal cueing, HEP, and activity modification. Patient articulated understanding and returned demonstration.  Patient Response to interventions: Patient reports 5/10 pain in sitting at end of session, patient report 0/10 pain in sidelying   ASSESSMENT Patient presents to clinic with excellent motivation to participate in therapy. Patient demonstrates deficits  in posture, strength, pain, spinal mobility, and overall function as evidenced by increased pain with anterior pelvic tilt and relief with lumbar flexion and posterior pelvic tilt. Patient able to achieve basic understanding of gentle mobilizations for pain relief and decreased stiffness during today's session and responded positively to active interventions. Patient will benefit from continued skilled therapeutic intervention to address remaining deficits in posture, strength, pain, spinal mobility, and overall function in order to decrease pain and risk of falls, increase function, and improve overall QOL.     PT Long Term Goals - 05/01/19 1518      PT LONG TERM GOAL #1   Title  Pt will decrease mODI scoreby at least 13 points in order demonstrate clinically significant reduction in back pain/disability.    Baseline  IE: 66% (33/50)    Time  8    Period  Weeks    Status  New    Target Date  06/26/19      PT LONG TERM GOAL #2   Title  Pt will decrease worst back pain over a given 7 day period as reported on NPRS by at least 2 points in order to demonstrate clinically significant reduction in back pain.    Baseline  IE: 10/10    Time  8    Period  Weeks    Status  New    Target Date  06/26/19      PT LONG TERM GOAL #3   Title  Patient will demonstrate clinically significant improved BLE strength as evidenced by a 5TSTS improvement >/= 3 seconds without use of BUE in order to improve function.    Baseline  IE: 33.9 sec, BUE, 2 LOB    Time  8    Period  Weeks    Status  New    Target Date  06/26/19      PT LONG TERM GOAL #4   Title  Patient will report improved tolerance to walking/standing activity for duration of > 20 min to ensure safe and pain reduced function in the community and at home.    Baseline  IE: 10 min    Time  8    Period  Weeks    Status  New    Target Date  06/26/19            Plan - 05/03/19 4315    Clinical Impression Statement  Patient presents to  clinic with excellent motivation to participate in therapy. Patient demonstrates deficits in posture, strength, pain, spinal mobility, and overall function as evidenced by increased pain with anterior pelvic tilt and relief with lumbar flexion and posterior pelvic tilt. Patient able to achieve basic understanding of gentle mobilizations for pain relief and decreased stiffness during today's session and responded positively to active interventions. Patient will benefit from continued skilled therapeutic intervention to address remaining deficits in posture, strength, pain, spinal mobility, and overall function in order to decrease pain and risk of falls, increase  function, and improve overall QOL.    Personal Factors and Comorbidities  Age;Comorbidity 3+;Past/Current Experience;Time since onset of injury/illness/exacerbation;Sex;Behavior Pattern    Comorbidities  anxiety, arthritis, peripheral neuropathy, chronic kidney disease, DM    Examination-Activity Limitations  Squat;Lift;Stairs;Stand;Locomotion Level;Sit;Sleep    Examination-Participation Restrictions  Laundry;Shop;Yard Work;Driving;Community Activity;Cleaning    Stability/Clinical Decision Making  Evolving/Moderate complexity    Rehab Potential  Fair    PT Frequency  2x / week    PT Duration  8 weeks    PT Treatment/Interventions  ADLs/Self Care Home Management;Aquatic Therapy;Cryotherapy;Electrical Stimulation;Moist Heat;Gait training;Stair training;Functional mobility training;Therapeutic activities;Therapeutic exercise;Balance training;Neuromuscular re-education;Patient/family education;Manual techniques;Taping;Spinal Manipulations;Joint Manipulations;Dry needling;Scar mobilization    PT Home Exercise Plan  Access Code: [email or web address]    Consulted and Agree with Plan of Care  Patient       Patient will benefit from skilled therapeutic intervention in order to improve the following deficits and impairments:  Abnormal gait, Decreased balance,  Decreased endurance, Decreased mobility, Difficulty walking, Improper body mechanics, Decreased activity tolerance, Decreased coordination, Pain, Postural dysfunction, Impaired flexibility, Decreased strength  Visit Diagnosis: 1. Chronic bilateral low back pain, unspecified whether sciatica present   2. Abnormal posture   3. Muscle weakness (generalized)        Problem List Patient Active Problem List   Diagnosis Date Noted  . Chronic bilateral low back pain with bilateral sciatica 04/17/2019  . Osteoarthritis 11/09/2017  . Anemia 09/28/2017  . Chronic kidney disease, stage 3 ([place]) 05/02/2017  . Closed fracture of left patella 04/27/2017  . Advanced care planning/counseling discussion 11/24/2016  . Insomnia 07/02/2015  . Depression 05/21/2015  . Hypertension 05/21/2015  . Diabetic neuropathy ([place]) 05/21/2015  . Hyperlipidemia associated with type 2 diabetes mellitus ([place]) 05/21/2015  . Chronic pain syndrome 05/21/2015  . Primary insomnia 05/21/2015  . GERD (gastroesophageal reflux disease) 05/21/2015  . Uncontrolled type 2 diabetes mellitus with chronic kidney disease ([place]) 05/21/2015  . Long-term insulin use ([place]) 08/16/2014  . Type 2 diabetes mellitus with microalbuminuria ([place]) 08/16/2014   Myles Gip PT, DPT [phone] 05/03/2019, 10:02 AM  [place] [facility] [address] [place], [place], [address] Phone: [phone]   Fax:  [phone]  Name: Kylii Ennis MRN: [id number] Date of Birth: 30-Aug-1951

## 2019-05-04 ENCOUNTER — Ambulatory Visit: Payer: Self-pay | Admitting: Pharmacist

## 2019-05-04 ENCOUNTER — Telehealth: Payer: Self-pay

## 2019-05-04 NOTE — Chronic Care Management (AMB) (Signed)
  Chronic Care Management   Note  05/04/2019 Name: Jessica Montoya MRN: [id number] DOB: 1951-07-29  Jessica Montoya is a 68 y.o. year old female who is a primary care patient of [name], [name], NP. The CCM team was consulted for assistance with chronic disease management and care coordination needs.  Outreached patient to discuss CCM team and medication management support. Left HIPAA compliant message for her to return my call at her convenience.     Follow up plan: - Will attempt outreach again in 2-3 weeks  [name], PharmD Clinical Pharmacist [place] [phone]

## 2019-05-08 ENCOUNTER — Ambulatory Visit: Payer: Medicare Other | Attending: Nurse Practitioner | Admitting: Physical Therapy

## 2019-05-08 ENCOUNTER — Other Ambulatory Visit: Payer: Self-pay

## 2019-05-08 ENCOUNTER — Encounter: Payer: Self-pay | Admitting: Physical Therapy

## 2019-05-08 DIAGNOSIS — M6281 Muscle weakness (generalized): Secondary | ICD-10-CM

## 2019-05-08 DIAGNOSIS — R293 Abnormal posture: Secondary | ICD-10-CM | POA: Insufficient documentation

## 2019-05-08 DIAGNOSIS — G8929 Other chronic pain: Secondary | ICD-10-CM | POA: Insufficient documentation

## 2019-05-08 DIAGNOSIS — M545 Low back pain, unspecified: Secondary | ICD-10-CM

## 2019-05-08 NOTE — Therapy (Signed)
[facility] [address] [place], [place], [address] Phone: [phone]   Fax:  [phone]  Physical Therapy Treatment  Patient Details  Name: Jessica Montoya MRN: [id number] Date of Birth: 1951/09/21 Referring Provider (PT): [name], NP   Encounter Date: 05/08/2019  PT End of Session - 05/08/19 1106    Visit Number  3    Number of Visits  17    Date for PT Re-Evaluation  06/26/19    Authorization Type  05/01/2019 eval    Authorization Time Period  3/10    PT Start Time  1100    PT Stop Time  1145    PT Time Calculation (min)  45 min    Activity Tolerance  Patient tolerated treatment well;Other (comment)   Patient limited by increased vertigo   Behavior During Therapy  [facility] for tasks assessed/performed       Past Medical History:  Diagnosis Date  . Anxiety   . Arthritis    knees  . Chronic pain   . CKD (chronic kidney disease), stage II   . Depression   . Diabetes mellitus without complication ([place])   . Diabetic neuropathy ([place])   . Fatigue    a. 05/2018 Echo: EF 60-65%, no rwma, nl RV fxn; b. 05/2018 Lexiscan MV: EF>65%. No ischemia/infarct.  . Hyperlipidemia   . Hypertension   . Insomnia   . Long term current use of insulin ([place])   . Peripheral neuropathy   . Vertigo    2x/month  . Wears dentures    full upper and lower    Past Surgical History:  Procedure Laterality Date  . ABDOMINAL HYSTERECTOMY    . CATARACT EXTRACTION W/PHACO Left 02/20/2019   Procedure: CATARACT EXTRACTION PHACO AND INTRAOCULAR LENS PLACEMENT ([place]) LEFT DIABETES;  Surgeon: Birder Robson, MD;  Location: [place];  Service: Ophthalmology;  Laterality: Left;  . CATARACT EXTRACTION W/PHACO Right 03/27/2019   Procedure: CATARACT EXTRACTION PHACO AND INTRAOCULAR LENS PLACEMENT ([place])  RIGHT DIABETIC;  Surgeon: Birder Robson, MD;  Location: [place];  Service: Ophthalmology;  Laterality: Right;  Diabetic -  insulin  . COLON SURGERY      There were no vitals filed for this visit.  Subjective Assessment - 05/08/19 1103    Subjective  Patient states that she feels things are getting better and the stretches. Patient was sore after Thursday's appointment and that lasted until midafternoon on Friday.    Currently in Pain?  Yes    Pain Score  4     Pain Onset  More than a month ago      TREATMENT Manual Therapy: STM and TPR performed to R hip, R lumbar region, and R sacral region to allow for decreased tension and pain and improved posture and function  Neuromuscular Re-education: Seated diaphragmatic breathing for nervous system down-regulation 3x15 throughout session as active rest Seated pelvic tilts with coordinated breath, x20 Seated trunk rotation with coordinated breath, x8 Seated RTB hip extension B, x10 Seated RTB hamstring curl B, x15 Seated B hamstring stretch with step, B x1 min each Seated clamshells RTB x20 Seated R sciatic nerve glides for decreased tingling in the RLE, x5  Patient educated throughout session on appropriate technique and form using multi-modal cueing, HEP, and activity modification. Patient articulated understanding and returned demonstration.  Patient Response to interventions: Patient denies increased pain and states that she can feel some release in the R hip and sacral region.  ASSESSMENT Patient presents to clinic with excellent motivation to participate in therapy. Patient demonstrates deficits in posture, strength, pain, spinal mobility, and overall function as evidenced by increased pain with anterior pelvic tilt and relief with lumbar flexion and posterior pelvic tilt. Patient able to achieve basic understanding of gentle mobilizations for pain relief in sitting and decreased tingling during today's session and responded positively to manual interventions. Patient will benefit from continued skilled therapeutic intervention to address remaining  deficits in posture, strength, pain, spinal mobility, and overall function in order to decrease pain and risk of falls, increase function, and improve overall QOL.     PT Long Term Goals - 05/01/19 1518      PT LONG TERM GOAL #1   Title  Pt will decrease mODI scoreby at least 13 points in order demonstrate clinically significant reduction in back pain/disability.    Baseline  IE: 66% (33/50)    Time  8    Period  Weeks    Status  New    Target Date  06/26/19      PT LONG TERM GOAL #2   Title  Pt will decrease worst back pain over a given 7 day period as reported on NPRS by at least 2 points in order to demonstrate clinically significant reduction in back pain.    Baseline  IE: 10/10    Time  8    Period  Weeks    Status  New    Target Date  06/26/19      PT LONG TERM GOAL #3   Title  Patient will demonstrate clinically significant improved BLE strength as evidenced by a 5TSTS improvement >/= 3 seconds without use of BUE in order to improve function.    Baseline  IE: 33.9 sec, BUE, 2 LOB    Time  8    Period  Weeks    Status  New    Target Date  06/26/19      PT LONG TERM GOAL #4   Title  Patient will report improved tolerance to walking/standing activity for duration of > 20 min to ensure safe and pain reduced function in the community and at home.    Baseline  IE: 10 min    Time  8    Period  Weeks    Status  New    Target Date  06/26/19            Plan - 05/08/19 1309    Clinical Impression Statement  Patient presents to clinic with excellent motivation to participate in therapy. Patient demonstrates deficits in posture, strength, pain, spinal mobility, and overall function as evidenced by increased pain with anterior pelvic tilt and relief with lumbar flexion and posterior pelvic tilt. Patient able to achieve basic understanding of gentle mobilizations for pain relief in sitting and decreased tingling during today's session and responded positively to manual  interventions. Patient will benefit from continued skilled therapeutic intervention to address remaining deficits in posture, strength, pain, spinal mobility, and overall function in order to decrease pain and risk of falls, increase function, and improve overall QOL.    Personal Factors and Comorbidities  Age;Comorbidity 3+;Past/Current Experience;Time since onset of injury/illness/exacerbation;Sex;Behavior Pattern    Comorbidities  anxiety, arthritis, peripheral neuropathy, chronic kidney disease, DM    Examination-Activity Limitations  Squat;Lift;Stairs;Stand;Locomotion Level;Sit;Sleep    Examination-Participation Restrictions  Laundry;Shop;Yard Work;Driving;Community Activity;Cleaning    Stability/Clinical Decision Making  Evolving/Moderate complexity    Rehab Potential  Fair    PT  Frequency  2x / week    PT Duration  8 weeks    PT Treatment/Interventions  ADLs/Self Care Home Management;Aquatic Therapy;Cryotherapy;Electrical Stimulation;Moist Heat;Gait training;Stair training;Functional mobility training;Therapeutic activities;Therapeutic exercise;Balance training;Neuromuscular re-education;Patient/family education;Manual techniques;Taping;Spinal Manipulations;Joint Manipulations;Dry needling;Scar mobilization    PT Home Exercise Plan  Access Code: [email or web address]    Consulted and Agree with Plan of Care  Patient       Patient will benefit from skilled therapeutic intervention in order to improve the following deficits and impairments:  Abnormal gait, Decreased balance, Decreased endurance, Decreased mobility, Difficulty walking, Improper body mechanics, Decreased activity tolerance, Decreased coordination, Pain, Postural dysfunction, Impaired flexibility, Decreased strength  Visit Diagnosis: 1. Chronic bilateral low back pain, unspecified whether sciatica present   2. Abnormal posture   3. Muscle weakness (generalized)        Problem List Patient Active Problem List   Diagnosis Date Noted   . Chronic bilateral low back pain with bilateral sciatica 04/17/2019  . Osteoarthritis 11/09/2017  . Anemia 09/28/2017  . Chronic kidney disease, stage 3 ([place]) 05/02/2017  . Closed fracture of left patella 04/27/2017  . Advanced care planning/counseling discussion 11/24/2016  . Insomnia 07/02/2015  . Depression 05/21/2015  . Hypertension 05/21/2015  . Diabetic neuropathy ([place]) 05/21/2015  . Hyperlipidemia associated with type 2 diabetes mellitus ([place]) 05/21/2015  . Chronic pain syndrome 05/21/2015  . Primary insomnia 05/21/2015  . GERD (gastroesophageal reflux disease) 05/21/2015  . Uncontrolled type 2 diabetes mellitus with chronic kidney disease ([place]) 05/21/2015  . Long-term insulin use ([place]) 08/16/2014  . Type 2 diabetes mellitus with microalbuminuria ([place]) 08/16/2014   Myles Gip PT, DPT [phone] 05/08/2019, 1:22 PM  [place] [facility] [address] [place], [place], [address] Phone: [phone]   Fax:  [phone]  Name: Jessica Montoya MRN: [id number] Date of Birth: 1951-03-09

## 2019-05-10 ENCOUNTER — Ambulatory Visit: Payer: Medicare Other | Admitting: Physical Therapy

## 2019-05-15 ENCOUNTER — Encounter: Payer: Medicare Other | Admitting: Physical Therapy

## 2019-05-17 ENCOUNTER — Encounter: Payer: Medicare Other | Admitting: Physical Therapy

## 2019-05-17 ENCOUNTER — Telehealth: Payer: Self-pay | Admitting: Nurse Practitioner

## 2019-05-17 MED ORDER — FLUCONAZOLE 150 MG PO TABS: 150.0000 mg | ORAL_TABLET | Freq: Once | ORAL | 0 refills | Status: AC

## 2019-05-17 NOTE — Telephone Encounter (Signed)
Called pt to relay message from [place]. Pt verbalized understanding.

## 2019-05-17 NOTE — Telephone Encounter (Signed)
Will send in script at this time for Diflucan and if worsening will obtain further sample at upcoming appointment.

## 2019-05-17 NOTE — Telephone Encounter (Signed)
Copied from [place] [id number]. Topic: General - Other >> May 16, 2019  3:53 PM [name] wrote: Reason for CRM: Patient called to request that Jolene please send an Rx to her pharmacy for a yeast infection that is oncoming. Per patient she have burning and itching upon urinating. Please advise Ph#  [phone] >> May 17, 2019  9:07 AM Jill Side wrote: Called pt to see about getting her scheduled or seeing if she can provide sample and do virtual pt states that she is scheduled for Monday and doesn't want to come up here twice. States she will wait until her appt. Please advise if anything different.

## 2019-05-18 ENCOUNTER — Telehealth: Payer: Self-pay

## 2019-05-18 ENCOUNTER — Ambulatory Visit: Payer: Self-pay | Admitting: Pharmacist

## 2019-05-18 NOTE — Chronic Care Management (AMB) (Signed)
  Chronic Care Management   Note  05/18/2019 Name: Jessica Montoya MRN: [id number] DOB: [birth year]  Jessica Montoya is a 68 y.o. year old female who is a primary care patient of [name], [name], NP. The CCM team was consulted for assistance with chronic disease management and care coordination needs.    Attempted to contact patient again to discuss chronic care management services; left HIPAA compliant message for her to return my call at her convenience.   She has an appointment with PCP next week; will collaborate with PCP and office staff to provide patient my contact information.   Follow up plan: - If I do not hear back, will outreach again in 3-5 weeks  Jessica Montoya, PharmD Clinical Pharmacist [place] [phone]

## 2019-05-21 ENCOUNTER — Ambulatory Visit (INDEPENDENT_AMBULATORY_CARE_PROVIDER_SITE_OTHER): Payer: Medicare Other | Admitting: Nurse Practitioner

## 2019-05-21 ENCOUNTER — Encounter: Payer: Self-pay | Admitting: Nurse Practitioner

## 2019-05-21 ENCOUNTER — Other Ambulatory Visit: Payer: Self-pay

## 2019-05-21 VITALS — BP 103/59 | HR 85 | Temp 97.6°F

## 2019-05-21 DIAGNOSIS — R5383 Other fatigue: Secondary | ICD-10-CM

## 2019-05-21 DIAGNOSIS — M5441 Lumbago with sciatica, right side: Secondary | ICD-10-CM | POA: Diagnosis not present

## 2019-05-21 DIAGNOSIS — M5442 Lumbago with sciatica, left side: Secondary | ICD-10-CM | POA: Diagnosis not present

## 2019-05-21 DIAGNOSIS — G8929 Other chronic pain: Secondary | ICD-10-CM

## 2019-05-21 NOTE — Patient Instructions (Signed)
Vitamin B12 Test and Folate Test  Why am I having this test? Vitamin B12 and folate (folic acid) are both B vitamins that are needed to make red blood cells and to keep your nervous system healthy. Vitamin B12 is found in foods such as meats, eggs, dairy products, and fish. Folate is found in fruits, beans, and leafy green vegetables. These vitamins also get added to some foods, such as grains and cereals. You may have a lack (deficiency) of these B vitamins in your body if you do not get enough of them in your diet. Low levels can also be caused by digestive system diseases that interfere with your ability to absorb the vitamins from your food. The most common cause of a vitamin B12 deficiency is the inability to absorb it (pernicious anemia). You may have a vitamin B12 and folate test if:  You have symptoms of vitamin B12 or folate deficiency, such as fatigue, headache, confusion, poor balance, or tingling and numbness.  You are pregnant or breastfeeding. Women who are pregnant or breastfeeding need more folate and may need to take supplements.  Your red blood cell count is low (anemia).  You are an older person and have mental confusion.  You have a disease or condition that may lead to a deficiency of these B vitamins. What is being tested? This test measures the amount of vitamin B12 and folate in your blood. The tests for vitamin B12 and folate may be done together or separately. What kind of sample is taken?  A blood sample is required for this test. It is usually collected by inserting a needle into a blood vessel. How do I prepare for this test? Follow instructions from your health care provider about eating and drinking before the test. Tell a health care provider about:  All medicines you are taking, including vitamins, herbs, eye drops, creams, and over-the-counter medicines.  Any medical conditions you have.  Whether you are pregnant or may be pregnant.  How often you drink  alcohol. How are the results reported? Your test results will be reported as values that identify the amount of vitamin B12 and folate in your blood. Your health care provider will compare your results to normal ranges that were established after testing a large group of people (reference ranges). Reference ranges may vary among labs and hospitals. For this test, common reference ranges are:  Vitamin B12: 160-950 pg/mL or 118-701 pmol/L (SI units).  Folate: 5-25 ng/mL or 11-57 nmol/L (SI units). What do the results mean? Results within the reference range are considered normal. Vitamin B12 or folate levels that are lower than the reference range may be caused by various conditions, including:  Anemia.  Poor nutrition.  Alcoholism.  Liver disease.  Digestive disease. High levels of vitamin B12 are rare, but they may happen if you have:  Cancer.  Diabetes.  Heart failure.  Obesity.  Liver disease.  Human immunodeficiency virus (HIV). High levels of folate may happen if:  You have pernicious anemia.  You are vegetarian.  You have had a recent blood transfusion. Talk with your health care provider about what your results mean. Questions to ask your health care provider Ask your health care provider, or the department that is doing the test:  When will my results be ready?  How will I get my results?  What are my treatment options?  What other tests do I need?  What are my next steps? Summary  Vitamin B12 and folate (folic   acid) are both B vitamins that are needed to make red blood cells and to keep your nervous system healthy.  You may have a lack (deficiency) of these B vitamins in your body if you do not get enough of them in your diet or if you have a digestive system disease.  This test measures the amount of vitamin B12 and folate in your blood. A blood sample is required for the test.  Talk with your health care provider about what your results mean.  This information is not intended to replace advice given to you by your health care provider. Make sure you discuss any questions you have with your health care provider. Document Released: 10/15/2004 Document Revised: 09/02/2017 Document Reviewed: 05/16/2017 Elsevier Patient Education  2020 [employer].

## 2019-05-21 NOTE — Progress Notes (Signed)
BP (!) 103/59   Pulse 85   Temp 97.6 F (36.4 C) (Oral)   LMP  (LMP Unknown)   SpO2 99%    Subjective:    Patient ID: Jessica Montoya, female    DOB: 1951-09-03, 68 y.o.   MRN: [id number]  HPI: Jessica Montoya is a 68 y.o. female  Chief Complaint  Patient presents with  . Back Pain    4 week f/up   BACK PAIN Seen on 04/17/2019 and provided referral to PT + short burst of Flexeril and Tramadol.  States she has had similar issues before, last about one year ago.  Had imaging done 2014 of lower back with no abnormalities seen and MRI  "Multilevel disc disease and small disc protrusions as  described above. No high-grade spinal stenosis or prominent neural foraminal".  In past muscle relaxer and short period of pain medication have helped per her report.  Is taking Gabapentin 300 MG BID (most recent CrCl based on August 2019 labs == 42.11).  Is currently doing PT at [facility] location.  Feels that the PT is helping it get better.  Does not feel like she needs refills on Tramadol and Flexeril.  Currently takes Vitamin D every day, no recent level.  Does endorse some low energy for several years, used to take Vitamin B12 shots in past but has not taken in several years.   Duration: chronic, on/off Mechanism of injury: no trauma Location: low back Onset: gradual Severity:improved at 1-2/10 at this time, continues to do PT stretches at home Quality: dull, aching and shooting Frequency: constant Radiation: R leg above the knee and L leg above the knee Aggravating factors: standing, movement, walking and bending Alleviating factors: laying Status: fluctuating Treatments attempted: rest and APAP + PT Relief with NSAIDs?: No NSAIDs Taken Nighttime pain:  no Paresthesias / decreased sensation:  no Bowel / bladder incontinence:  no Fevers:  no Dysuria / urinary frequency:  no  Relevant past medical, surgical, family and social history reviewed and updated as indicated. Interim  medical history since our last visit reviewed. Allergies and medications reviewed and updated.  Review of Systems  Constitutional: Positive for fatigue (low energy for several years). Negative for activity change, appetite change, diaphoresis and fever.  Respiratory: Negative for cough, chest tightness and shortness of breath.   Cardiovascular: Negative for chest pain, palpitations and leg swelling.  Gastrointestinal: Negative for abdominal distention, abdominal pain, constipation, diarrhea, nausea and vomiting.  Endocrine: Negative for cold intolerance, heat intolerance, polydipsia, polyphagia and polyuria.  Musculoskeletal: Positive for back pain.  Neurological: Negative for dizziness, syncope, weakness, light-headedness, numbness and headaches.  Psychiatric/Behavioral: Negative.     Per HPI unless specifically indicated above     Objective:    BP (!) 103/59   Pulse 85   Temp 97.6 F (36.4 C) (Oral)   LMP  (LMP Unknown)   SpO2 99%   Wt Readings from Last 3 Encounters:  03/27/19 176 lb (79.8 kg)  02/20/19 172 lb (78 kg)  08/15/18 150 lb 8 oz (68.3 kg)    Physical Exam Vitals signs and nursing note reviewed.  Constitutional:      General: She is awake. She is not in acute distress.    Appearance: She is well-developed. She is not ill-appearing.  HENT:     Head: Normocephalic.     Right Ear: Hearing normal.     Left Ear: Hearing normal.     Nose: Nose normal.  Eyes:  General: Lids are normal.        Right eye: No discharge.        Left eye: No discharge.     Conjunctiva/sclera: Conjunctivae normal.     Pupils: Pupils are equal, round, and reactive to light.  Neck:     Musculoskeletal: Normal range of motion and neck supple.     Thyroid: No thyromegaly.     Vascular: No carotid bruit.  Cardiovascular:     Rate and Rhythm: Normal rate and regular rhythm.     Heart sounds: Normal heart sounds. No murmur. No gallop.   Pulmonary:     Effort: Pulmonary effort is  normal. No accessory muscle usage or respiratory distress.     Breath sounds: Normal breath sounds.  Abdominal:     General: Bowel sounds are normal.     Palpations: Abdomen is soft. There is no hepatomegaly or splenomegaly.  Musculoskeletal:     Lumbar back: She exhibits decreased range of motion. She exhibits no tenderness, no bony tenderness, no swelling, no edema, no pain and no spasm.     Right lower leg: No edema.     Left lower leg: No edema.     Comments: Negative straight leg testing.  No tenderness on palpation of lower back.  Decrease ROM lower back with flexion (can bend to just above knee before discomfort) and extension.  No decrease in ROM with lateral or rotation.  Able to walk today and move onto exam table without discomfort.  Lymphadenopathy:     Cervical: No cervical adenopathy.  Skin:    General: Skin is warm and dry.  Neurological:     Mental Status: She is alert and oriented to person, place, and time.  Psychiatric:        Attention and Perception: Attention normal.        Mood and Affect: Mood normal.        Behavior: Behavior normal. Behavior is cooperative.        Thought Content: Thought content normal.        Judgment: Judgment normal.     Results for orders placed or performed in visit on 04/17/19  Bayer DCA Hb A1c Waived  Result Value Ref Range   HB A1C (BAYER DCA - WAIVED) 7.3 (H) <7.0 %  Comprehensive metabolic panel  Result Value Ref Range   Glucose 495 (H) 65 - 99 mg/dL   BUN 22 8 - 27 mg/dL   Creatinine, Ser 1.50 (H) 0.57 - 1.00 mg/dL   GFR calc non Af Amer 36 (L) >59 mL/min/1.73   GFR calc Af Amer 41 (L) >59 mL/min/1.73   BUN/Creatinine Ratio 15 12 - 28   Sodium 133 (L) 134 - 144 mmol/L   Potassium 5.3 (H) 3.5 - 5.2 mmol/L   Chloride 93 (L) 96 - 106 mmol/L   CO2 22 20 - 29 mmol/L   Calcium 9.9 8.7 - 10.3 mg/dL   Total Protein 7.3 6.0 - 8.5 g/dL   Albumin 4.6 3.8 - 4.8 g/dL   Globulin, Total 2.7 1.5 - 4.5 g/dL   Albumin/Globulin Ratio  1.7 1.2 - 2.2   Bilirubin Total 0.7 0.0 - 1.2 mg/dL   Alkaline Phosphatase 79 39 - 117 IU/L   AST 13 0 - 40 IU/L   ALT 21 0 - 32 IU/L  Lipid Panel w/o Chol/HDL Ratio  Result Value Ref Range   Cholesterol, Total 152 100 - 199 mg/dL   Triglycerides 202 (H) 0 -  149 mg/dL   HDL 58 >39 mg/dL   VLDL Cholesterol Cal 40 5 - 40 mg/dL   LDL Calculated 54 0 - 99 mg/dL  TSH  Result Value Ref Range   TSH 2.760 0.450 - 4.500 uIU/mL  Microalbumin, Urine Waived  Result Value Ref Range   Microalb, Ur Waived 30 (H) 0 - 19 mg/L   Creatinine, Urine Waived 50 10 - 300 mg/dL   Microalb/Creat Ratio 30-300 (H) <30 mg/g  Magnesium  Result Value Ref Range   Magnesium 1.7 1.6 - 2.3 mg/dL      Assessment & Plan:   Problem List Items Addressed This Visit      Nervous and Auditory   Chronic bilateral low back pain with bilateral sciatica - Primary    Improved at this time, but chronic issue with waxing and waning.  Refer back to Pt in future if return of pain, as this was beneficial during this flare.  Continue simple treatment at home and ongoing stretches.  Return for worsening.      Relevant Orders   VITAMIN D 25 Hydroxy (Vit-D Deficiency, Fractures)    Other Visit Diagnoses    Low energy       History of low B12 and Vit D, check levels today and add supplements.   Relevant Orders   Vitamin B12       Follow up plan: Return if symptoms worsen or fail to improve.

## 2019-05-21 NOTE — Assessment & Plan Note (Signed)
Improved at this time, but chronic issue with waxing and waning.  Refer back to Pt in future if return of pain, as this was beneficial during this flare.  Continue simple treatment at home and ongoing stretches.  Return for worsening.

## 2019-05-22 ENCOUNTER — Encounter: Payer: Medicare Other | Admitting: Physical Therapy

## 2019-05-22 LAB — VITAMIN D 25 HYDROXY (VIT D DEFICIENCY, FRACTURES): Vit D, 25-Hydroxy: 29.9 ng/mL — ABNORMAL LOW (ref 30.0–100.0)

## 2019-05-22 LAB — VITAMIN B12: Vitamin B-12: 371 pg/mL (ref 232–1245)

## 2019-05-24 ENCOUNTER — Encounter: Payer: Medicare Other | Admitting: Physical Therapy

## 2019-05-29 ENCOUNTER — Encounter: Payer: Medicare Other | Admitting: Physical Therapy

## 2019-05-31 ENCOUNTER — Encounter: Payer: Medicare Other | Admitting: Physical Therapy

## 2019-06-05 ENCOUNTER — Encounter: Payer: Medicare Other | Admitting: Physical Therapy

## 2019-06-07 ENCOUNTER — Encounter: Payer: Medicare Other | Admitting: Physical Therapy

## 2019-06-12 ENCOUNTER — Encounter: Payer: Medicare Other | Admitting: Physical Therapy

## 2019-06-14 ENCOUNTER — Encounter: Payer: Medicare Other | Admitting: Physical Therapy

## 2019-06-19 ENCOUNTER — Encounter: Payer: Medicare Other | Admitting: Physical Therapy

## 2019-06-21 ENCOUNTER — Encounter: Payer: Medicare Other | Admitting: Physical Therapy

## 2019-06-26 ENCOUNTER — Ambulatory Visit: Payer: Self-pay | Admitting: Pharmacist

## 2019-06-26 ENCOUNTER — Encounter: Payer: Medicare Other | Admitting: Physical Therapy

## 2019-06-26 ENCOUNTER — Telehealth: Payer: Self-pay

## 2019-06-26 NOTE — Chronic Care Management (AMB) (Signed)
  Chronic Care Management   Note  06/26/2019 Name: Jessica Montoya MRN: [id number] DOB: 06-Jan-1951  Jessica Montoya is a 68 y.o. year old female who is a primary care patient of [name], [name], NP. The CCM team was consulted for assistance with chronic disease management and care coordination needs.    Attempt #3 to contact patient to discuss CCM service and medication management support. Left HIPAA compliant message for patient to return my call at her convenience if interested.   Follow up plan: - Will plan to meet patient next month at scheduled primary care provider appointment to discuss CCM services.   [name], PharmD Clinical Pharmacist [place] [phone]

## 2019-06-28 ENCOUNTER — Encounter: Payer: Medicare Other | Admitting: Physical Therapy

## 2019-07-18 ENCOUNTER — Ambulatory Visit: Payer: Self-pay | Admitting: Nurse Practitioner

## 2019-07-18 ENCOUNTER — Ambulatory Visit: Payer: Self-pay | Admitting: Pharmacist

## 2019-07-18 ENCOUNTER — Ambulatory Visit: Payer: Self-pay

## 2019-07-18 NOTE — Chronic Care Management (AMB) (Signed)
  Chronic Care Management   Note  07/18/2019 Name: Jessica Montoya MRN: [id number] DOB: 1951-09-30  Jessica Montoya is a 68 y.o. year old female who is a primary care patient of [name], [name], NP. The CCM team was consulted for assistance with chronic disease management and care coordination needs.    Previously had 3 consecutive unsuccessful outreach attempts to patient to discuss CCM service. Had planned to meet with patient face to face today during primary care provider appointment to discuss CCM, but this appointment was rescheduled. Unfortunately, rescheduled to a day I am not in clinic. Will collaborate with PCP to discuss CCM with patient at her upcoming appointment and see if she is still interested.    [name], PharmD Clinical Pharmacist [place] [phone]

## 2019-07-30 ENCOUNTER — Encounter: Payer: Self-pay | Admitting: Nurse Practitioner

## 2019-07-30 ENCOUNTER — Ambulatory Visit (INDEPENDENT_AMBULATORY_CARE_PROVIDER_SITE_OTHER): Payer: Medicare Other | Admitting: Nurse Practitioner

## 2019-07-30 ENCOUNTER — Other Ambulatory Visit: Payer: Self-pay

## 2019-07-30 VITALS — BP 119/74 | HR 76 | Temp 98.7°F

## 2019-07-30 DIAGNOSIS — F329 Major depressive disorder, single episode, unspecified: Secondary | ICD-10-CM | POA: Diagnosis not present

## 2019-07-30 DIAGNOSIS — I10 Essential (primary) hypertension: Secondary | ICD-10-CM

## 2019-07-30 DIAGNOSIS — Z1211 Encounter for screening for malignant neoplasm of colon: Secondary | ICD-10-CM

## 2019-07-30 DIAGNOSIS — E785 Hyperlipidemia, unspecified: Secondary | ICD-10-CM

## 2019-07-30 DIAGNOSIS — E1169 Type 2 diabetes mellitus with other specified complication: Secondary | ICD-10-CM

## 2019-07-30 DIAGNOSIS — E1122 Type 2 diabetes mellitus with diabetic chronic kidney disease: Secondary | ICD-10-CM

## 2019-07-30 DIAGNOSIS — IMO0002 Reserved for concepts with insufficient information to code with codable children: Secondary | ICD-10-CM

## 2019-07-30 DIAGNOSIS — F5101 Primary insomnia: Secondary | ICD-10-CM

## 2019-07-30 DIAGNOSIS — E1165 Type 2 diabetes mellitus with hyperglycemia: Secondary | ICD-10-CM

## 2019-07-30 DIAGNOSIS — F32A Depression, unspecified: Secondary | ICD-10-CM

## 2019-07-30 LAB — BAYER DCA HB A1C WAIVED: HB A1C (BAYER DCA - WAIVED): 11.5 % — ABNORMAL HIGH

## 2019-07-30 MED ORDER — AMITRIPTYLINE HCL 50 MG PO TABS: 50.0000 mg | ORAL_TABLET | Freq: Every day | ORAL | 2 refills | Status: DC

## 2019-07-30 MED ORDER — SIMVASTATIN 40 MG PO TABS: 40.0000 mg | ORAL_TABLET | Freq: Every day | ORAL | 3 refills | Status: DC

## 2019-07-30 MED ORDER — GABAPENTIN 300 MG PO CAPS: 300.0000 mg | ORAL_CAPSULE | Freq: Two times a day (BID) | ORAL | 2 refills | Status: DC

## 2019-07-30 MED ORDER — OMEPRAZOLE 20 MG PO CPDR: 20.0000 mg | DELAYED_RELEASE_CAPSULE | Freq: Every day | ORAL | 3 refills | Status: DC

## 2019-07-30 MED ORDER — TRAZODONE HCL 100 MG PO TABS: 100.0000 mg | ORAL_TABLET | Freq: Every day | ORAL | 2 refills | Status: DC

## 2019-07-30 MED ORDER — FLUOXETINE HCL 20 MG PO CAPS: ORAL_CAPSULE | ORAL | 3 refills | Status: DC

## 2019-07-30 MED ORDER — TRAZODONE HCL 50 MG PO TABS: 25.0000 mg | ORAL_TABLET | Freq: Every evening | ORAL | 3 refills | Status: DC | PRN

## 2019-07-30 MED ORDER — RAMIPRIL 2.5 MG PO CAPS: 2.5000 mg | ORAL_CAPSULE | Freq: Every day | ORAL | 3 refills | Status: DC

## 2019-07-30 NOTE — Assessment & Plan Note (Signed)
Chronic, ongoing, followed by endo.  A1C today 11.5%, has been missing one dose of insulin daily due to cost.  Continue current regimen at this time and collaboration with endo, have scheduled appointment for 09/10/2019 with endo while patient in office today.  Provided patient with CCM PharmD card and information, have highly advised her to call and we can work on assistance programs.  Discussed at length concerns for hyperglycemia with her missing a dose of insulin daily, as A1C trended from 7's to now 11's with her missing doses.  Recommend she take her insulin TID as directed.  Will follow-up with her after endo appointment.

## 2019-07-30 NOTE — Patient Instructions (Signed)
Carbohydrate Counting for Diabetes Mellitus, Adult  Carbohydrate counting is a method of keeping track of how many carbohydrates you eat. Eating carbohydrates naturally increases the amount of sugar (glucose) in the blood. Counting how many carbohydrates you eat helps keep your blood glucose within normal limits, which helps you manage your diabetes (diabetes mellitus). It is important to know how many carbohydrates you can safely have in each meal. This is different for every person. A diet and nutrition specialist (registered dietitian) can help you make a meal plan and calculate how many carbohydrates you should have at each meal and snack. Carbohydrates are found in the following foods:  Grains, such as breads and cereals.  Dried beans and soy products.  Starchy vegetables, such as potatoes, peas, and corn.  Fruit and fruit juices.  Milk and yogurt.  Sweets and snack foods, such as cake, cookies, candy, chips, and soft drinks. How do I count carbohydrates? There are two ways to count carbohydrates in food. You can use either of the methods or a combination of both. Reading "Nutrition Facts" on packaged food The "Nutrition Facts" list is included on the labels of almost all packaged foods and beverages in the [place] It includes:  The serving size.  Information about nutrients in each serving, including the grams (g) of carbohydrate per serving. To use the "Nutrition Facts":  Decide how many servings you will have.  Multiply the number of servings by the number of carbohydrates per serving.  The resulting number is the total amount of carbohydrates that you will be having. Learning standard serving sizes of other foods When you eat carbohydrate foods that are not packaged or do not include "Nutrition Facts" on the label, you need to measure the servings in order to count the amount of carbohydrates:  Measure the foods that you will eat with a food scale or measuring cup, if needed.   Decide how many standard-size servings you will eat.  Multiply the number of servings by 15. Most carbohydrate-rich foods have about 15 g of carbohydrates per serving. ? For example, if you eat 8 oz (170 g) of strawberries, you will have eaten 2 servings and 30 g of carbohydrates (2 servings x 15 g = 30 g).  For foods that have more than one food mixed, such as soups and casseroles, you must count the carbohydrates in each food that is included. The following list contains standard serving sizes of common carbohydrate-rich foods. Each of these servings has about 15 g of carbohydrates:   hamburger bun or  English muffin.   oz (15 mL) syrup.   oz (14 g) jelly.  1 slice of bread.  1 six-inch tortilla.  3 oz (85 g) cooked rice or pasta.  4 oz (113 g) cooked dried beans.  4 oz (113 g) starchy vegetable, such as peas, corn, or potatoes.  4 oz (113 g) hot cereal.  4 oz (113 g) mashed potatoes or  of a large baked potato.  4 oz (113 g) canned or frozen fruit.  4 oz (120 mL) fruit juice.  4-6 crackers.  6 chicken nuggets.  6 oz (170 g) unsweetened dry cereal.  6 oz (170 g) plain fat-free yogurt or yogurt sweetened with artificial sweeteners.  8 oz (240 mL) milk.  8 oz (170 g) fresh fruit or one small piece of fruit.  24 oz (680 g) popped popcorn. Example of carbohydrate counting Sample meal  3 oz (85 g) chicken breast.  6 oz (170 g)   brown rice.  4 oz (113 g) corn.  8 oz (240 mL) milk.  8 oz (170 g) strawberries with sugar-free whipped topping. Carbohydrate calculation 1. Identify the foods that contain carbohydrates: ? Rice. ? Corn. ? Milk. ? Strawberries. 2. Calculate how many servings you have of each food: ? 2 servings rice. ? 1 serving corn. ? 1 serving milk. ? 1 serving strawberries. 3. Multiply each number of servings by 15 g: ? 2 servings rice x 15 g = 30 g. ? 1 serving corn x 15 g = 15 g. ? 1 serving milk x 15 g = 15 g. ? 1 serving  strawberries x 15 g = 15 g. 4. Add together all of the amounts to find the total grams of carbohydrates eaten: ? 30 g + 15 g + 15 g + 15 g = 75 g of carbohydrates total. Summary  Carbohydrate counting is a method of keeping track of how many carbohydrates you eat.  Eating carbohydrates naturally increases the amount of sugar (glucose) in the blood.  Counting how many carbohydrates you eat helps keep your blood glucose within normal limits, which helps you manage your diabetes.  A diet and nutrition specialist (registered dietitian) can help you make a meal plan and calculate how many carbohydrates you should have at each meal and snack. This information is not intended to replace advice given to you by your health care provider. Make sure you discuss any questions you have with your health care provider. Document Released: 09/20/2005 Document Revised: 04/14/2017 Document Reviewed: 03/03/2016 Elsevier Patient Education  2020 Elsevier Inc.  

## 2019-07-30 NOTE — Assessment & Plan Note (Signed)
Chronic, ongoing.  Will increase Trazodone to 100 MG every night, as reports 50 MG not beneficial at this time.  Avoid Ambien.

## 2019-07-30 NOTE — Progress Notes (Signed)
BP 119/74   Pulse 76   Temp 98.7 F (37.1 C) (Oral)   LMP  (LMP Unknown)   SpO2 98%    Subjective:    Patient ID: Jessica Montoya, female    DOB: 21-Feb-1951, 68 y.o.   MRN: [id number]  HPI: Jessica Montoya is a 68 y.o. female  Chief Complaint  Patient presents with  . Diabetes  . Hyperlipidemia  . Hypertension   DIABETES Is followed by Dr. [name], last saw her in March.  Taking U500 65 units TID.  Has tried Metformin (GI upset), only oral medication she has tried (it is on last endo note as continuing to take, but patient reports not taking this ).  Has not tried weekly injectables.  On review of [facility] notes her A1C was 5.0% last in May but she did not attend follow-up in May and last saw Dr. [name] in March, at baseline runs 9-12 ranges and today 7.3%.  She does endorse difficulty with insulin, $748 for a month supply, now is in donut hole.  She does endorse she has been taking insulin twice a day due to cost over the past few months.   Hypoglycemic episodes:none Polydipsia/polyuria: no Visual disturbance: no Chest pain: no Paresthesias: no Glucose Monitoring: yes  Accucheck frequency: TID  Fasting glucose: 180-200 range  Post prandial: at 2 pm averages 160-170  Evening: at 8 pm ranges 220  Before meals:  Taking Insulin?: yes  Long acting insulin: U 500 65 units TID  Short acting insulin: Blood Pressure Monitoring: rarely Retinal Examination: Not up to Date Foot Exam: Up to Date Pneumovax: Up to Date Influenza: Up to Date Aspirin: yes   HYPERTENSION / HYPERLIPIDEMIA Continues on Ramipril and Simvastatin.  Satisfied with current treatment? yes Duration of hypertension: chronic BP monitoring frequency: rarely BP range: 100/60's at home BP medication side effects: no Duration of hyperlipidemia: chronic Cholesterol medication side effects: no Cholesterol supplements: none Medication compliance: good compliance Aspirin: no Recent stressors: no Recurrent  headaches: no Visual changes: no Palpitations: no Dyspnea: no Chest pain: no Lower extremity edema: no Dizzy/lightheaded: no  DEPRESSION Continues on Prozac 20 MG and Trazodone 50 MG, not taking Amtriptyline as she was worried about side effects.  Discussed alternate medication regimen options for mood and pain in future, such as Cymbalta and tapering off Prozac and Amitriptyline. She reports Trazodone 50 MG is not helping her sleep as well anymore. Mood status: controlled Satisfied with current treatment?: yes Symptom severity: mild  Duration of current treatment : chronic Side effects: no Medication compliance: good compliance Psychotherapy/counseling: none Depressed mood: no Anxious mood: no Anhedonia: no Significant weight loss or gain: no Insomnia: yes hard to fall asleep Fatigue: yes Feelings of worthlessness or guilt: no Impaired concentration/indecisiveness: no Suicidal ideations: no Hopelessness: no Crying spells: no Depression screen [facility] 2/9 07/30/2019 04/17/2019 06/01/2018 09/28/2017 09/28/2017  Decreased Interest 0 0 0 1 1  Down, Depressed, Hopeless 0 0 0 0 0  PHQ - 2 Score 0 0 0 1 1  Altered sleeping 3 3 - 3 3  Tired, decreased energy 3 3 - 3 3  Change in appetite 0 0 - 3 3  Feeling bad or failure about yourself  0 0 - 1 1  Trouble concentrating 0 0 - 1 1  Moving slowly or fidgety/restless 0 0 - 1 1  Suicidal thoughts 0 0 - 0 0  PHQ-9 Score 6 6 - 13 13  Difficult doing work/chores Not difficult at  all Not difficult at all - - -    Relevant past medical, surgical, family and social history reviewed and updated as indicated. Interim medical history since our last visit reviewed. Allergies and medications reviewed and updated.  Review of Systems  Constitutional: Negative for activity change, appetite change, diaphoresis, fatigue and fever.  Respiratory: Negative for cough, chest tightness and shortness of breath.   Cardiovascular: Negative for chest pain,  palpitations and leg swelling.  Gastrointestinal: Negative for abdominal distention, abdominal pain, constipation, diarrhea, nausea and vomiting.  Endocrine: Negative for cold intolerance, heat intolerance, polydipsia, polyphagia and polyuria.  Neurological: Negative for dizziness, syncope, weakness, light-headedness, numbness and headaches.  Psychiatric/Behavioral: Negative.     Per HPI unless specifically indicated above     Objective:    BP 119/74   Pulse 76   Temp 98.7 F (37.1 C) (Oral)   LMP  (LMP Unknown)   SpO2 98%   Wt Readings from Last 3 Encounters:  03/27/19 176 lb (79.8 kg)  02/20/19 172 lb (78 kg)  08/15/18 150 lb 8 oz (68.3 kg)    Physical Exam Vitals signs and nursing note reviewed.  Constitutional:      General: She is awake. She is not in acute distress.    Appearance: She is well-developed. She is not ill-appearing.  HENT:     Head: Normocephalic.     Right Ear: Hearing normal.     Left Ear: Hearing normal.  Eyes:     General: Lids are normal.        Right eye: No discharge.        Left eye: No discharge.     Conjunctiva/sclera: Conjunctivae normal.     Pupils: Pupils are equal, round, and reactive to light.  Neck:     Musculoskeletal: Normal range of motion and neck supple.     Thyroid: No thyromegaly.     Vascular: No carotid bruit.  Cardiovascular:     Rate and Rhythm: Normal rate and regular rhythm.     Heart sounds: Normal heart sounds. No murmur. No gallop.   Pulmonary:     Effort: Pulmonary effort is normal. No accessory muscle usage or respiratory distress.     Breath sounds: Normal breath sounds.  Abdominal:     General: Bowel sounds are normal.     Palpations: Abdomen is soft.  Musculoskeletal:     Right lower leg: No edema.     Left lower leg: No edema.  Lymphadenopathy:     Cervical: No cervical adenopathy.  Skin:    General: Skin is warm and dry.  Neurological:     Mental Status: She is alert and oriented to person, place, and  time.  Psychiatric:        Attention and Perception: Attention normal.        Mood and Affect: Mood normal.        Behavior: Behavior normal. Behavior is cooperative.        Thought Content: Thought content normal.        Judgment: Judgment normal.    Diabetic Foot Exam - Simple   Simple Foot Form Visual Inspection No deformities, no ulcerations, no other skin breakdown bilaterally: Yes Sensation Testing See comments: Yes Pulse Check Posterior Tibialis and Dorsalis pulse intact bilaterally: Yes Comments Sensation right foot 7/10 and left foot 7/10.     Results for orders placed or performed in visit on 05/21/19  VITAMIN D 25 Hydroxy (Vit-D Deficiency, Fractures)  Result Value Ref  Range   Vit D, 25-Hydroxy 29.9 (L) 30.0 - 100.0 ng/mL  Vitamin B12  Result Value Ref Range   Vitamin B-12 371 232 - 1,245 pg/mL      Assessment & Plan:   Problem List Items Addressed This Visit      Cardiovascular and Mediastinum   Hypertension    Chronic, ongoing.  Continue current medication regimen, Ramipril at low dose for kidney protection.  CMP today.       Relevant Medications   ramipril (ALTACE) 2.5 MG capsule   simvastatin (ZOCOR) 40 MG tablet     Endocrine   Hyperlipidemia associated with type 2 diabetes mellitus (HCC)    Chronic, ongoing.  Continue current medication regimen and adjust as needed.  CMP today, lipid panel next visit.      Relevant Medications   ramipril (ALTACE) 2.5 MG capsule   simvastatin (ZOCOR) 40 MG tablet   Uncontrolled type 2 diabetes mellitus with chronic kidney disease ([place]) - Primary    Chronic, ongoing, followed by endo.  A1C today 11.5%, has been missing one dose of insulin daily due to cost.  Continue current regimen at this time and collaboration with endo, have scheduled appointment for 09/10/2019 with endo while patient in office today.  Provided patient with CCM PharmD card and information, have highly advised her to call and we can work on  assistance programs.  Discussed at length concerns for hyperglycemia with her missing a dose of insulin daily, as A1C trended from 7's to now 11's with her missing doses.  Recommend she take her insulin TID as directed.  Will follow-up with her after endo appointment.      Relevant Medications   ramipril (ALTACE) 2.5 MG capsule   simvastatin (ZOCOR) 40 MG tablet   Other Relevant Orders   Bayer DCA Hb A1c Waived   Comprehensive metabolic panel     Other   Depression    Chronic, ongoing.  Continue Prozac and increase Trazodone to 100 MG every night.  Consider reduction next visit and change from Prozac to Cymbalta which would benefit mood and pain level.  Denies SI/HI.      Relevant Medications   FLUoxetine (PROZAC) 20 MG capsule   traZODone (DESYREL) 100 MG tablet   Primary insomnia    Chronic, ongoing.  Will increase Trazodone to 100 MG every night, as reports 50 MG not beneficial at this time.  Avoid Ambien.         Other Visit Diagnoses    Colon cancer screening       Due for screening per her report.   Relevant Orders   Ambulatory referral to Gastroenterology       Follow up plan: Return in about 3 months (around 10/30/2019) for T2DM, HTN/HLD.

## 2019-07-30 NOTE — Assessment & Plan Note (Signed)
Chronic, ongoing.  Continue Prozac and increase Trazodone to 100 MG every night.  Consider reduction next visit and change from Prozac to Cymbalta which would benefit mood and pain level.  Denies SI/HI.

## 2019-07-30 NOTE — Assessment & Plan Note (Signed)
Chronic, ongoing.  Continue current medication regimen and adjust as needed.  CMP today, lipid panel next visit.

## 2019-07-30 NOTE — Assessment & Plan Note (Signed)
Chronic, ongoing.  Continue current medication regimen, Ramipril at low dose for kidney protection.  CMP today.

## 2019-07-31 LAB — COMPREHENSIVE METABOLIC PANEL
ALT: 19 IU/L (ref 0–32)
AST: 18 IU/L (ref 0–40)
Albumin/Globulin Ratio: 1.8 (ref 1.2–2.2)
Albumin: 4.2 g/dL (ref 3.8–4.8)
Alkaline Phosphatase: 68 IU/L (ref 39–117)
BUN/Creatinine Ratio: 10 — ABNORMAL LOW (ref 12–28)
BUN: 13 mg/dL (ref 8–27)
Bilirubin Total: 0.5 mg/dL (ref 0.0–1.2)
CO2: 23 mmol/L (ref 20–29)
Calcium: 9.3 mg/dL (ref 8.7–10.3)
Chloride: 100 mmol/L (ref 96–106)
Creatinine, Ser: 1.29 mg/dL — ABNORMAL HIGH (ref 0.57–1.00)
GFR calc Af Amer: 49 mL/min/{1.73_m2} — ABNORMAL LOW (ref >59)
GFR calc non Af Amer: 42 mL/min/{1.73_m2} — ABNORMAL LOW (ref >59)
Globulin, Total: 2.4 g/dL (ref 1.5–4.5)
Glucose: 305 mg/dL — ABNORMAL HIGH (ref 65–99)
Potassium: 4.6 mmol/L (ref 3.5–5.2)
Sodium: 137 mmol/L (ref 134–144)
Total Protein: 6.6 g/dL (ref 6.0–8.5)

## 2019-08-03 ENCOUNTER — Ambulatory Visit: Payer: Self-pay | Admitting: Pharmacist

## 2019-08-03 NOTE — Chronic Care Management (AMB) (Signed)
  Chronic Care Management   Note  08/03/2019 Name: Othell Fornash MRN: [id number] DOB: [birth year]  Kamar Tolson is a 68 y.o. year old female who is a primary care patient of [name], [name], NP. The CCM team was consulted for assistance with chronic disease management and care coordination needs.    Attempted to contact patient to discuss medication management needs and CCM team. Left HIPAA compliant message for patient to return my call at her convenience.   Follow up plan: - If I do not hear back, will attempt outreach again in the next 2-4 weeks  [name], PharmD Clinical Pharmacist [place] [phone]

## 2019-08-10 ENCOUNTER — Ambulatory Visit: Payer: Self-pay | Admitting: Pharmacist

## 2019-08-10 DIAGNOSIS — E1122 Type 2 diabetes mellitus with diabetic chronic kidney disease: Secondary | ICD-10-CM

## 2019-08-10 DIAGNOSIS — F329 Major depressive disorder, single episode, unspecified: Secondary | ICD-10-CM

## 2019-08-10 DIAGNOSIS — IMO0002 Reserved for concepts with insufficient information to code with codable children: Secondary | ICD-10-CM

## 2019-08-10 DIAGNOSIS — F32A Depression, unspecified: Secondary | ICD-10-CM

## 2019-08-10 NOTE — Chronic Care Management (AMB) (Signed)
Chronic Care Management   Note  08/10/2019 Name: Jessica Montoya MRN: [id number] DOB: 05-01-1951   Subjective:  Jessica Montoya is a 68 y.o. year old female who is a primary care patient of Montoya, Jessica Faster, NP. The CCM team was consulted for assistance with chronic disease management and care coordination needs.    Contacted patient for medication management needs today.   Ms. Althoff was given information about Chronic Care Management services today including:  1. CCM service includes personalized support from designated clinical staff supervised by her physician, including individualized plan of care and coordination with other care providers 2. 24/7 contact phone numbers for assistance for urgent and routine care needs. 3. Service will only be billed when office clinical staff spend 20 minutes or more in a month to coordinate care. 4. Only one practitioner may furnish and bill the service in a calendar month. 5. The patient may stop CCM services at any time (effective at the end of the month) by phone call to the office staff. 6. The patient will be responsible for cost sharing (co-pay) of up to 20% of the service fee (after annual deductible is met).  Patient agreed to services and verbal consent obtained.   Review of patient status, including review of consultants reports, laboratory and other test data, was performed as part of comprehensive evaluation and provision of chronic care management services.   Objective:  Lab Results  Component Value Date   CREATININE 1.29 (H) 07/30/2019   CREATININE 1.50 (H) 04/17/2019   CREATININE 1.41 (H) 05/11/2018    Lab Results  Component Value Date   HGBA1C 11.5 (H) 07/30/2019       Component Value Date/Time   CHOL 152 04/17/2019 1422   CHOL 125 05/21/2015 1500   TRIG 202 (H) 04/17/2019 1422   TRIG 225 (H) 05/21/2015 1500   HDL 58 04/17/2019 1422   VLDL 45 (H) 05/21/2015 1500   LDLCALC 54 04/17/2019 1422    Clinical  ASCVD: No  The 10-year ASCVD risk score Jessica Bussing DC Jr., et al., 2013) is: 16.5%   Values used to calculate the score:     Age: 43 years     Sex: Female     Is Non-Hispanic African American: No     Diabetic: Yes     Tobacco smoker: No     Systolic Blood Pressure: 300 mmHg     Is BP treated: Yes     HDL Cholesterol: 58 mg/dL     Total Cholesterol: 152 mg/dL    BP Readings from Last 3 Encounters:  07/30/19 119/74  05/21/19 (!) 103/59  04/17/19 (!) 94/59    Allergies  Allergen Reactions  . Exenatide Nausea Only    Caused nausea ([facility]) 10/28/2009  . Insulin Aspart Nausea Only    Caused nausea ([facility]) 10/28/2009  . Other Nausea Only    Caused nausea ([facility]) 10/28/2009  . Compazine [Prochlorperazine Edisylate] Rash    Medications Reviewed Today    Reviewed by De Hollingshead, [facility] (Pharmacist) on 08/10/19 at 1411  Med List Status: <None>  Medication Order Taking? Sig Documenting Provider Last Dose Status Informant  aspirin 81 MG tablet [id number] Yes Take 81 mg by mouth daily. [provider] Taking Active   Cholecalciferol (VITAMIN D PO) [id number] Yes Take 2,000 Units by mouth daily.  [provider] Taking Active   ferrous fumarate (HEMOCYTE - 106 MG FE) 325 (106 FE) MG TABS tablet [id number] Yes Take 1 tablet by mouth  daily. [provider] Taking Active   FLUoxetine (PROZAC) 20 MG capsule [id number] Yes TAKE 1 CAPSULE BY MOUTH EVERY DAY Montoya, Jessica T, NP Taking Active   gabapentin (NEURONTIN) 300 MG capsule [id number] Yes Take 1 capsule (300 mg total) by mouth 2 (two) times daily. Jessica Montoya T, NP Taking Active   HUMULIN R U-500 KWIKPEN 500 UNIT/ML Claiborne Rigg [id number] Yes INJECT 65 UNITS 3 TIMES DAILY AT 8AM, 2PM AND 8 PM [provider] Taking Active            Med Note Jessica Montoya, Arville Lime   Fri Aug 10, 2019  2:07 PM) 65 units BID  Multiple Vitamin (MULTIVITAMIN) tablet [id number] Yes Take 1 tablet by mouth daily. [provider] Taking  Active   omeprazole (PRILOSEC) 20 MG capsule [id number] Yes Take 1 capsule (20 mg total) by mouth daily. Jessica Montoya T, NP Taking Active   ramipril (ALTACE) 2.5 MG capsule [id number] Yes Take 1 capsule (2.5 mg total) by mouth daily. Jessica Montoya T, NP Taking Active   simvastatin (ZOCOR) 40 MG tablet [id number] Yes Take 1 tablet (40 mg total) by mouth daily. Jessica Montoya T, NP Taking Active   traZODone (DESYREL) 100 MG tablet [id number] Yes Take 1 tablet (100 mg total) by mouth at bedtime. Jessica Montoya T, NP Taking Active   vitamin B-12 (CYANOCOBALAMIN) 1000 MCG tablet [id number] Yes Take 2,000 mcg by mouth daily. [provider] Taking Active            Assessment:   Goals Addressed            This Visit's Progress     Patient Stated   . PharmD "I can't afford my insulin" (pt-stated)       Current Barriers:  . Diabetes: uncontrolled; most recent A1c 11.5% . Current antihyperglycemic regimen: Humulin R 65 units TID, though only taking BID to conserve supply due to cost now that she is in the Medicare Coverage Gap . Of note; patient does indicate significant issues falling asleep and staying asleep due to "worrying" and "my mind just going". Asked how long she had been on fluoxetine 20 mg daily; she notes she has been on this dose for years. Denies benefit from trazodone 100 mg QPM . Cardiovascular risk reduction: o Current hypertensive regimen: ramipril 2.5 mg daily o Current hyperlipidemia regimen: simvastatin 40 mg daily  Pharmacist Clinical Goal(s):  [name] Over the next 90 days, patient with work with PharmD and primary care provider to address optimized glycemic regimen  Interventions: . Comprehensive medication review performed, medication list updated in electronic medical record . Discussed [place] patient assistance program; patient meets income criteria for this program. Discussed application process. Patient will come by clinic to sign application and  provide proof of income. I will collaborate w/ Dr. [name] for prescription signature.  . Also discussed that she would meet eligibility for other patient assistance programs, if provider decided GLP1/SGLT2 was appropriate in the future. . Moving forward, consider if dose increase of fluoxetine is appropriate, may provide some benefit towards worry-related insomnia. If so, would lower dose or discontinue trazodone d/t lack of reported benefit   Patient Self Care Activities:  . Patient will check blood glucose BID , document, and provide at future appointments . Patient will take medications as prescribed . Patient will report any questions or concerns to provider   Initial goal documentation        Plan: - Will collaborate with patient and provider as above  Catie Jessica Montoya, PharmD Clinical Pharmacist [place] [phone]

## 2019-08-10 NOTE — Patient Instructions (Signed)
Visit Information  Goals Addressed            This Visit's Progress     Patient Stated   . PharmD "I can't afford my insulin" (pt-stated)       Current Barriers:  . Diabetes: uncontrolled; most recent A1c 11.5% . Current antihyperglycemic regimen: Humulin R 65 units TID, though only taking BID to conserve supply due to cost now that she is in the Medicare Coverage Gap . Of note; patient does indicate significant issues falling asleep and staying asleep due to "worrying" and "my mind just going". Asked how long she had been on fluoxetine 20 mg daily; she notes she has been on this dose for years. Denies benefit from trazodone 100 mg QPM . Cardiovascular risk reduction: o Current hypertensive regimen: ramipril 2.5 mg daily o Current hyperlipidemia regimen: simvastatin 40 mg daily  Pharmacist Clinical Goal(s):  [name] Over the next 90 days, patient with work with PharmD and primary care provider to address optimized glycemic regimen  Interventions: . Comprehensive medication review performed, medication list updated in electronic medical record . Discussed [place] patient assistance program; patient meets income criteria for this program. Discussed application process. Patient will come by clinic to sign application and provide proof of income. I will collaborate w/ Dr. [name] for prescription signature.  . Also discussed that she would meet eligibility for other patient assistance programs, if provider decided GLP1/SGLT2 was appropriate in the future. . Moving forward, consider if dose increase of fluoxetine is appropriate, may provide some benefit towards worry-related insomnia. If so, would lower dose or discontinue trazodone d/t lack of reported benefit   Patient Self Care Activities:  . Patient will check blood glucose BID , document, and provide at future appointments . Patient will take medications as prescribed . Patient will report any questions or concerns to provider   Initial  goal documentation        Ms. Trower was given information about Chronic Care Management services today including:  1. CCM service includes personalized support from designated clinical staff supervised by her physician, including individualized plan of care and coordination with other care providers 2. 24/7 contact phone numbers for assistance for urgent and routine care needs. 3. Service will only be billed when office clinical staff spend 20 minutes or more in a month to coordinate care. 4. Only one practitioner may furnish and bill the service in a calendar month. 5. The patient may stop CCM services at any time (effective at the end of the month) by phone call to the office staff. 6. The patient will be responsible for cost sharing (co-pay) of up to 20% of the service fee (after annual deductible is met).  Patient agreed to services and verbal consent obtained.   The patient verbalized understanding of instructions provided today and declined a print copy of patient instruction materials.   Plan: - Will collaborate with patient and provider as above  [name], PharmD Clinical Pharmacist [place] [phone]

## 2019-08-13 ENCOUNTER — Telehealth: Payer: Self-pay

## 2019-08-13 ENCOUNTER — Other Ambulatory Visit: Payer: Self-pay | Admitting: Nurse Practitioner

## 2019-08-13 NOTE — Telephone Encounter (Signed)
Requested medication (s) are due for refill today: yes  Requested medication (s) are on the active medication list: yes  Last refill:  07/30/2019  Future visit scheduled: yes  Notes to clinic:  Requesting 90 day supply   Requested Prescriptions  Pending Prescriptions Disp Refills   traZODone (DESYREL) 100 MG tablet [Pharmacy Med Name: TRAZODONE 100 MG TABLET] 90 tablet 2    Sig: TAKE 1 TABLET BY MOUTH EVERYDAY AT BEDTIME     Psychiatry: Antidepressants - Serotonin Modulator Passed - 08/13/2019  2:19 PM      Passed - Valid encounter within last 6 months    Recent Outpatient Visits          2 weeks ago Uncontrolled type 2 diabetes mellitus with chronic kidney disease ([place])   [place] [name], Jolene T, NP   2 months ago Chronic bilateral low back pain with bilateral sciatica   [place], Jolene T, NP   3 months ago Uncontrolled type 2 diabetes mellitus with chronic kidney disease ([place])   [place], Jolene T, NP   12 months ago Uncontrolled type 2 diabetes mellitus with chronic kidney disease ([place])   [place], [name], NP   1 year ago Annual physical exam   [place] Trinna Post, PA-C      Future Appointments            In 2 months [name], [name], NP [employer]           Passed - Completed PHQ-2 or PHQ-9 in the last 360 days.

## 2019-08-13 NOTE — Telephone Encounter (Signed)
Returned patients call from Friday, in regards to scheduling her colonoscopy.  LVM for her to call the office back.  Thanks [employer]

## 2019-08-13 NOTE — Telephone Encounter (Signed)
Can a 90 day supply be sent in for this medication?

## 2019-08-14 ENCOUNTER — Ambulatory Visit: Payer: Self-pay | Admitting: Pharmacist

## 2019-08-14 ENCOUNTER — Telehealth: Payer: Self-pay | Admitting: Nurse Practitioner

## 2019-08-14 DIAGNOSIS — IMO0002 Reserved for concepts with insufficient information to code with codable children: Secondary | ICD-10-CM

## 2019-08-14 DIAGNOSIS — E1122 Type 2 diabetes mellitus with diabetic chronic kidney disease: Secondary | ICD-10-CM

## 2019-08-14 NOTE — Telephone Encounter (Signed)
Copied from [place] [phone]. Topic: General - Other >> Aug 14, 2019  9:59 AM Lennox Solders wrote: Reason for CRM: [name] with [facility] is calling [facility] concerning patient assistant form

## 2019-08-14 NOTE — Patient Instructions (Addendum)
Visit Information  Goals Addressed            This Visit's Progress     Patient Stated   . PharmD "I can't afford my insulin" (pt-stated)       Current Barriers:  . Diabetes: uncontrolled; most recent A1c 11.5% . Current antihyperglycemic regimen: Humulin R U500 65 units TID, though only taking BID to conserve supply due to cost now that she is in the Medicare Coverage Gap o Collaboratively decided to apply for Humulin R U500 patient assistance . Cardiovascular risk reduction: o Current hypertensive regimen: ramipril 2.5 mg daily o Current hyperlipidemia regimen: simvastatin 40 mg daily  Pharmacist Clinical Goal(s):  [name] Over the next 90 days, patient with work with PharmD and primary care provider to address optimized glycemic regimen  Interventions: . Received patient portions of application. Faxed to [place] at [facility] per Jessica's request. Requested that they let me know when the application has been submitted.   Patient Self Care Activities:  . Patient will check blood glucose BID , document, and provide at future appointments . Patient will take medications as prescribed . Patient will report any questions or concerns to provider   Please see past updates related to this goal by clicking on the "Past Updates" button in the selected goal         The patient verbalized understanding of instructions provided today and declined a print copy of patient instruction materials.   Plan: - Will outreach patient/[facility] in the next 2-3 weeks to follow up on medication access  [name], PharmD Clinical Pharmacist [place] [phone]

## 2019-08-14 NOTE — Telephone Encounter (Signed)
Returned call

## 2019-08-14 NOTE — Chronic Care Management (AMB) (Addendum)
Chronic Care Management   Follow Up Note   08/14/2019 Name: Jessica Montoya MRN: [id number] DOB: 06/17/1951  Referred by: Jessica Lick, NP Reason for referral : Chronic Care Management (Medication Management)   Jessica Montoya is a 68 y.o. year old female who is a primary care patient of Jessica Montoya, Jessica Faster, NP. The CCM team was consulted for assistance with chronic disease management and care coordination needs.    Care coordination completed today.   Review of patient status, including review of consultants reports, relevant laboratory and other test results, and collaboration with appropriate care team members and the patient's provider was performed as part of comprehensive patient evaluation and provision of chronic care management services.    SDOH (Social Determinants of Health) screening performed today: Financial Strain . See Care Plan for related entries.   Outpatient Encounter Medications as of 08/14/2019  Medication Sig Note  . aspirin 81 MG tablet Take 81 mg by mouth daily.   . Cholecalciferol (VITAMIN D PO) Take 2,000 Units by mouth daily.    . ferrous fumarate (HEMOCYTE - 106 MG FE) 325 (106 FE) MG TABS tablet Take 1 tablet by mouth daily.   [name] FLUoxetine (PROZAC) 20 MG capsule TAKE 1 CAPSULE BY MOUTH EVERY DAY   . gabapentin (NEURONTIN) 300 MG capsule Take 1 capsule (300 mg total) by mouth 2 (two) times daily.   [name] HUMULIN R U-500 KWIKPEN 500 UNIT/ML kwikpen INJECT 65 UNITS 3 TIMES DAILY AT 8AM, 2PM AND 8 PM 08/10/2019: 65 units BID  . Multiple Vitamin (MULTIVITAMIN) tablet Take 1 tablet by mouth daily.   [name] omeprazole (PRILOSEC) 20 MG capsule Take 1 capsule (20 mg total) by mouth daily.   . ramipril (ALTACE) 2.5 MG capsule Take 1 capsule (2.5 mg total) by mouth daily.   . simvastatin (ZOCOR) 40 MG tablet Take 1 tablet (40 mg total) by mouth daily.   . traZODone (DESYREL) 100 MG tablet TAKE 1 TABLET BY MOUTH EVERYDAY AT BEDTIME   . vitamin B-12 (CYANOCOBALAMIN)  1000 MCG tablet Take 2,000 mcg by mouth daily.    No facility-administered encounter medications on file as of 08/14/2019.      Goals Addressed            This Visit's Progress     Patient Stated   . PharmD "I can't afford my insulin" (pt-stated)       Current Barriers:  . Diabetes: uncontrolled; most recent A1c 11.5% . Current antihyperglycemic regimen: Humulin R U500 65 units TID, though only taking BID to conserve supply due to cost now that she is in the Medicare Coverage Gap o Collaboratively decided to apply for Humulin R U500 patient assistance . Cardiovascular risk reduction: o Current hypertensive regimen: ramipril 2.5 mg daily o Current hyperlipidemia regimen: simvastatin 40 mg daily  Pharmacist Clinical Goal(s):  [name] Over the next 90 days, patient with work with PharmD and primary care provider to address optimized glycemic regimen  Interventions: . Faxed provider portion of the application to Dr. Gabriel Montoya at [facility]. Received return call from [facility] w/ Dr. Gabriel Montoya. She noted that they would only fill out the form if patient had met the out of pocket spend requirement for the year; I informed Jessica Montoya that [employer] no longer had a $1000 out of pocket spend requirement; she noted that they would fill out the application and submit for the patient once she had supplied the required application information. Once patient supplies me with signature and income  information, I will pass along to Jessica Montoya at [facility]. I reiterated that patient has been rationing her insulin therapy d/t cost.   Patient Self Care Activities:  . Patient will check blood glucose BID , document, and provide at future appointments . Patient will take medications as prescribed . Patient will report any questions or concerns to provider   Please see past updates related to this goal by clicking on the "Past Updates" button in the selected goal          Plan:  - Will await patient portion of application   Jessica Montoya, PharmD Clinical Pharmacist [place] [phone]

## 2019-08-14 NOTE — Chronic Care Management (AMB) (Signed)
Chronic Care Management   Follow Up Note   08/14/2019 Name: Jessica Montoya MRN: [id number] DOB: Aug 04, 1951  Referred by: [name], NP Reason for referral : Chronic Care Management (Medication Management)   Jessica Montoya is a 68 y.o. year old female who is a primary care patient of [name], [name], NP. The CCM team was consulted for assistance with chronic disease management and care coordination needs.    Care coordination completed today.   Review of patient status, including review of consultants reports, relevant laboratory and other test results, and collaboration with appropriate care team members and the patient's provider was performed as part of comprehensive patient evaluation and provision of chronic care management services.    SDOH (Social Determinants of Health) screening performed today: Financial Strain . See Care Plan for related entries.   Outpatient Encounter Medications as of 08/14/2019  Medication Sig Note  . aspirin 81 MG tablet Take 81 mg by mouth daily.   . Cholecalciferol (VITAMIN D PO) Take 2,000 Units by mouth daily.    . ferrous fumarate (HEMOCYTE - 106 MG FE) 325 (106 FE) MG TABS tablet Take 1 tablet by mouth daily.   [name] FLUoxetine (PROZAC) 20 MG capsule TAKE 1 CAPSULE BY MOUTH EVERY DAY   . gabapentin (NEURONTIN) 300 MG capsule Take 1 capsule (300 mg total) by mouth 2 (two) times daily.   [name] HUMULIN R U-500 KWIKPEN 500 UNIT/ML kwikpen INJECT 65 UNITS 3 TIMES DAILY AT 8AM, 2PM AND 8 PM 08/10/2019: 65 units BID  . Multiple Vitamin (MULTIVITAMIN) tablet Take 1 tablet by mouth daily.   [name] omeprazole (PRILOSEC) 20 MG capsule Take 1 capsule (20 mg total) by mouth daily.   . ramipril (ALTACE) 2.5 MG capsule Take 1 capsule (2.5 mg total) by mouth daily.   . simvastatin (ZOCOR) 40 MG tablet Take 1 tablet (40 mg total) by mouth daily.   . traZODone (DESYREL) 100 MG tablet TAKE 1 TABLET BY MOUTH EVERYDAY AT BEDTIME   . vitamin B-12 (CYANOCOBALAMIN)  1000 MCG tablet Take 2,000 mcg by mouth daily.    No facility-administered encounter medications on file as of 08/14/2019.      Goals Addressed            This Visit's Progress     Patient Stated   . PharmD "I can't afford my insulin" (pt-stated)       Current Barriers:  . Diabetes: uncontrolled; most recent A1c 11.5% . Current antihyperglycemic regimen: Humulin R U500 65 units TID, though only taking BID to conserve supply due to cost now that she is in the Medicare Coverage Gap o Collaboratively decided to apply for Humulin R U500 patient assistance . Cardiovascular risk reduction: o Current hypertensive regimen: ramipril 2.5 mg daily o Current hyperlipidemia regimen: simvastatin 40 mg daily  Pharmacist Clinical Goal(s):  [name] Over the next 90 days, patient with work with PharmD and primary care provider to address optimized glycemic regimen  Interventions: . Received patient portions of application. Faxed to [place] at [facility] per Jessica's request. Requested that they let me know when the application has been submitted.   Patient Self Care Activities:  . Patient will check blood glucose BID , document, and provide at future appointments . Patient will take medications as prescribed . Patient will report any questions or concerns to provider   Please see past updates related to this goal by clicking on the "Past Updates" button in the selected goal  Plan: - Will outreach patient/[facility] in the next 2-3 weeks to follow up on medication access  [name], PharmD Clinical Pharmacist [place] [phone]

## 2019-08-16 ENCOUNTER — Encounter: Payer: Self-pay | Admitting: *Deleted

## 2019-08-17 ENCOUNTER — Telehealth: Payer: Self-pay | Admitting: Nurse Practitioner

## 2019-08-17 ENCOUNTER — Other Ambulatory Visit: Payer: Self-pay | Admitting: Nurse Practitioner

## 2019-08-17 MED ORDER — FLUCONAZOLE 150 MG PO TABS: 150.0000 mg | ORAL_TABLET | Freq: Once | ORAL | 0 refills | Status: AC

## 2019-08-17 NOTE — Telephone Encounter (Signed)
Please let patient know since we are going into weekend will call in one dose of Diflucan oral, please hold Simvastatin for 24 to 48 hours after taking to avoid interaction.  Thank you.

## 2019-08-17 NOTE — Telephone Encounter (Signed)
Called patient, no answer left a message for patient to return my call.

## 2019-08-17 NOTE — Progress Notes (Signed)
Patient reports yeast, will send in one time order.

## 2019-08-17 NOTE — Telephone Encounter (Signed)
Pt called and stated that she has a yeast infection and would like something called in. Please advise

## 2019-08-17 NOTE — Telephone Encounter (Signed)
Patient notified

## 2019-08-24 ENCOUNTER — Ambulatory Visit: Payer: Self-pay | Admitting: Pharmacist

## 2019-08-24 DIAGNOSIS — E1122 Type 2 diabetes mellitus with diabetic chronic kidney disease: Secondary | ICD-10-CM

## 2019-08-24 DIAGNOSIS — IMO0002 Reserved for concepts with insufficient information to code with codable children: Secondary | ICD-10-CM

## 2019-08-24 NOTE — Patient Instructions (Signed)
Visit Information  Goals Addressed            This Visit's Progress     Patient Stated   . PharmD "I can't afford my insulin" (pt-stated)       Current Barriers:  . Diabetes: uncontrolled; most recent A1c 11.5% . Current antihyperglycemic regimen: Humulin R U500 65 units TID, though only taking BID to conserve supply due to cost now that she is in the Medicare Coverage Gap o Collaboratively decided to apply for Humulin R U500 patient assistance; collaborated w/ Dr. Joycie Peek office to complete this  . Cardiovascular risk reduction: o Current hypertensive regimen: ramipril 2.5 mg daily o Current hyperlipidemia regimen: simvastatin 40 mg daily  Pharmacist Clinical Goal(s):  [name] Over the next 90 days, patient with work with PharmD and primary care provider to address optimized glycemic regimen  Interventions: . [place]. Patient has been APPROVED for [employer] assistance for Humulin U500. Spoke w/ the [employer]; patient is scheduled to receive her medication 08/28/2019.  [name] Contacted patient; left message noting that her medication would be delivered as above, and if that delivery date did not work for her to call to reschedule at [phone]  Patient Self Care Activities:  . Patient will check blood glucose BID , document, and provide at future appointments . Patient will take medications as prescribed . Patient will report any questions or concerns to provider   Please see past updates related to this goal by clicking on the "Past Updates" button in the selected goal         The patient verbalized understanding of instructions provided today and declined a print copy of patient instruction materials.   Plan: - Will follow up with patient in the next 2 weeks to ensure the medication was received  [name], PharmD Clinical Pharmacist [place] [phone]

## 2019-08-24 NOTE — Chronic Care Management (AMB) (Signed)
Chronic Care Management   Follow Up Note   08/24/2019 Name: Jessica Montoya MRN: [id number] DOB: July 18, 1951  Referred by: [name], NP Reason for referral : Chronic Care Management (Medication Management)   Jessica Montoya is a 68 y.o. year old female who is a primary care patient of [name], [name], NP. The CCM team was consulted for assistance with chronic disease management and care coordination needs.    Care coordination completed today.    Review of patient status, including review of consultants reports, relevant laboratory and other test results, and collaboration with appropriate care team members and the patient's provider was performed as part of comprehensive patient evaluation and provision of chronic care management services.    SDOH (Social Determinants of Health) screening performed today: Financial Strain . See Care Plan for related entries.   Outpatient Encounter Medications as of 08/24/2019  Medication Sig Note  . aspirin 81 MG tablet Take 81 mg by mouth daily.   . Cholecalciferol (VITAMIN D PO) Take 2,000 Units by mouth daily.    . ferrous fumarate (HEMOCYTE - 106 MG FE) 325 (106 FE) MG TABS tablet Take 1 tablet by mouth daily.   [name] FLUoxetine (PROZAC) 20 MG capsule TAKE 1 CAPSULE BY MOUTH EVERY DAY   . gabapentin (NEURONTIN) 300 MG capsule Take 1 capsule (300 mg total) by mouth 2 (two) times daily.   [name] HUMULIN R U-500 KWIKPEN 500 UNIT/ML kwikpen INJECT 65 UNITS 3 TIMES DAILY AT 8AM, 2PM AND 8 PM 08/10/2019: 65 units BID  . Multiple Vitamin (MULTIVITAMIN) tablet Take 1 tablet by mouth daily.   [name] omeprazole (PRILOSEC) 20 MG capsule Take 1 capsule (20 mg total) by mouth daily.   . ramipril (ALTACE) 2.5 MG capsule Take 1 capsule (2.5 mg total) by mouth daily.   . simvastatin (ZOCOR) 40 MG tablet Take 1 tablet (40 mg total) by mouth daily.   . traZODone (DESYREL) 100 MG tablet TAKE 1 TABLET BY MOUTH EVERYDAY AT BEDTIME   . vitamin B-12  (CYANOCOBALAMIN) 1000 MCG tablet Take 2,000 mcg by mouth daily.    No facility-administered encounter medications on file as of 08/24/2019.      Goals Addressed            This Visit's Progress     Patient Stated   . PharmD "I can't afford my insulin" (pt-stated)       Current Barriers:  . Diabetes: uncontrolled; most recent A1c 11.5% . Current antihyperglycemic regimen: Humulin R U500 65 units TID, though only taking BID to conserve supply due to cost now that she is in the Medicare Coverage Gap o Collaboratively decided to apply for Humulin R U500 patient assistance; collaborated w/ Dr. Joycie Peek office to complete this  . Cardiovascular risk reduction: o Current hypertensive regimen: ramipril 2.5 mg daily o Current hyperlipidemia regimen: simvastatin 40 mg daily  Pharmacist Clinical Goal(s):  [name] Over the next 90 days, patient with work with PharmD and primary care provider to address optimized glycemic regimen  Interventions: . [place]. Patient has been APPROVED for [employer] assistance for Humulin U500. Spoke w/ the [employer]; patient is scheduled to receive her medication 08/28/2019.  [name] Contacted patient; left message noting that her medication would be delivered as above, and if that delivery date did not work for her to call to reschedule at [phone]  Patient Self Care Activities:  . Patient will check blood glucose BID , document, and provide at future  appointments . Patient will take medications as prescribed . Patient will report any questions or concerns to provider   Please see past updates related to this goal by clicking on the "Past Updates" button in the selected goal         Plan: - Will follow up with patient in the next 2 weeks to ensure the medication was received  [name], PharmD Clinical Pharmacist [place] [phone]

## 2019-09-07 ENCOUNTER — Telehealth: Payer: Self-pay

## 2019-09-07 ENCOUNTER — Ambulatory Visit: Payer: Self-pay | Admitting: Pharmacist

## 2019-09-07 NOTE — Chronic Care Management (AMB) (Signed)
  Chronic Care Management   Note  09/07/2019 Name: Jessica Montoya MRN: [id number] DOB: 09-Mar-1951  Jessica Montoya is a 68 y.o. year old female who is a primary care patient of [name], [name], NP. The CCM team was consulted for assistance with chronic disease management and care coordination needs.    Attempted to contact patient for medication management review. Left HIPAA compliant message for her to return my call at her convenience.   Follow up plan: - If I do not hear back, will outreach patient again in the next 2-3 weeks  [name], PharmD, [place] [phone]

## 2019-09-21 ENCOUNTER — Telehealth: Payer: Self-pay

## 2019-09-21 ENCOUNTER — Ambulatory Visit: Payer: Self-pay | Admitting: Pharmacist

## 2019-09-21 NOTE — Chronic Care Management (AMB) (Signed)
  Chronic Care Management   Note  09/21/2019 Name: Jessica Montoya MRN: [id number] DOB: [birth year]  Jessica Montoya is a 68 y.o. year old female who is a primary care patient of [name], [name], NP. The CCM team was consulted for assistance with chronic disease management and care coordination needs.    Attempted to contact patient to discuss medication management and reapplication process for patient assistance for 2021. Left HIPAA compliant message for patient to return my call at her convenience.   Follow up plan: - Will collaborate w/ scheduling care guide to reschedule a phone outreach   [name], PharmD, [place] [phone]

## 2019-10-18 ENCOUNTER — Ambulatory Visit: Payer: Self-pay | Admitting: Pharmacist

## 2019-10-18 NOTE — Chronic Care Management (AMB) (Signed)
  Chronic Care Management   Note  10/18/2019 Name: Jessica Montoya MRN: [id number] DOB: Feb 08, 1951  [name] is a 69 y.o. year old female who is a primary care patient of [name], [name], NP. The CCM team was consulted for assistance with chronic disease management and care coordination needs.    Received call back from patient today - she notes that she was out of town for 4 weeks and missed my messages. Notes she is being tested for COVID tomorrow d/t symptoms. She says she has about 2 month supply of insulin at home right now.   Rescheduled outreach to 11/16/19 to discuss PAP re-enrollment and medication management.   [name], PharmD, [place] [phone]

## 2019-10-19 ENCOUNTER — Telehealth: Payer: Self-pay

## 2019-10-19 ENCOUNTER — Encounter: Payer: Self-pay | Admitting: Nurse Practitioner

## 2019-10-19 ENCOUNTER — Other Ambulatory Visit: Payer: Self-pay

## 2019-10-19 ENCOUNTER — Ambulatory Visit (INDEPENDENT_AMBULATORY_CARE_PROVIDER_SITE_OTHER): Payer: Medicare Other | Admitting: Nurse Practitioner

## 2019-10-19 ENCOUNTER — Ambulatory Visit: Payer: Medicare Other | Attending: Internal Medicine

## 2019-10-19 DIAGNOSIS — N898 Other specified noninflammatory disorders of vagina: Secondary | ICD-10-CM

## 2019-10-19 DIAGNOSIS — Z20822 Contact with and (suspected) exposure to covid-19: Secondary | ICD-10-CM

## 2019-10-19 DIAGNOSIS — Z8616 Personal history of COVID-19: Secondary | ICD-10-CM | POA: Insufficient documentation

## 2019-10-19 MED ORDER — FLUCONAZOLE 150 MG PO TABS: 150.0000 mg | ORAL_TABLET | Freq: Once | ORAL | 0 refills | Status: AC

## 2019-10-19 MED ORDER — FLUTICASONE PROPIONATE 50 MCG/ACT NA SUSP: 2.0000 | Freq: Every day | NASAL | 6 refills | Status: DC

## 2019-10-19 MED ORDER — BENZONATATE 100 MG PO CAPS: 200.0000 mg | ORAL_CAPSULE | Freq: Three times a day (TID) | ORAL | 0 refills | Status: DC | PRN

## 2019-10-19 NOTE — Telephone Encounter (Signed)
Copied from [place] [phone]. Topic: General - Other >> Oct 18, 2019 11:48 AM [name] wrote: Reason for CRM: [employer] called to say that she came in contact to someone with Covid and she is not feeling well did not give her exact symptoms but was very congested nasally while on the phone. Patient will be going in for a covid test on 10/19/2019 along with her husband. But she is asking for Jolene to give her a call back states that she need some medicine. Please call Ph#  [phone]   Routing to provider. Can anything be sent in for the patient?

## 2019-10-19 NOTE — Assessment & Plan Note (Signed)
Unable to physically examine today but given history of yeast infections and diabetes mellitus, 1x dose of antifungal given for probable yeast infection. Advised to return to clinic if symptoms persist or worsen.

## 2019-10-19 NOTE — Telephone Encounter (Signed)
LVM for pt to call back.

## 2019-10-19 NOTE — Telephone Encounter (Signed)
She will need virtual visit, can we see if [name] can see her today?  Thank you.

## 2019-10-19 NOTE — Telephone Encounter (Signed)
Pt called back, scheduled pt to speak with [name] this evening.

## 2019-10-19 NOTE — Assessment & Plan Note (Signed)
Symptoms point to viral illness. Patient tested for COVID-19 today. Will follow up with results and PCP as patient is a good candidate for monoclonal antibodies if COVID+. In meantime, will treat symptoms symptomatically with nasal spray and antitussives, Rx sent to pharmacy. Discussed isolation precautions. Patient advised to stay home except to get medical care. Separate from others in household if possible. Wear a mask whenever necessary to be around others. Wash hands often and avoid sharing personal household items with others. Clean and disinfect frequently touched household surfaces daily. Monitor symptoms return to clinic if they worsen; if severe chest pain or SOB, go to ED.

## 2019-10-19 NOTE — Progress Notes (Signed)
LMP  (LMP Unknown)    Subjective:    Patient ID: Jessica Montoya, female    DOB: May 14, 1951, 69 y.o.   MRN: [id number]  HPI: Jessica Montoya is a 69 y.o. female  Chief Complaint  Patient presents with  . URI    pt exposed to COVID, pending test result. States she has been coughing, had a headache, sore throat, fever, and fatigue  . Vaginal Itching    pt states she has been having a yeast infection due to having these issues    UPPER RESPIRATORY TRACT INFECTION Worst symptom: Fatigue Onset: 10/14/2019 Fever: yes; low-grade 99.7 Cough: yes, dry Shortness of breath: no Wheezing: no Chest pain: yes ; from coughing Chest tightness: no Chest congestion: no Nasal congestion: no Runny nose: yes Post nasal drip: no Sneezing: yes Sore throat: yes Swollen glands: no Sinus pressure: no Headache: yes Face pain: no Toothache: no Ear pain: no  Ear pressure: no  Eyes red/itching:yes Eye drainage/crusting: no  Nausea/Vomiting: no  Diarrhea: yes; some formed stool, somewhat better Loss of taste/smell: no Rash: no Fatigue: yes Sick contacts: yes, was around a person who ended up being COVID+, got tested this morning and result pending Strep contacts: no  Context: fluctuating Recurrent sinusitis: no Relief with OTC cold/cough medications: somewhat  Treatments attempted: tylenol  Patient also reports vaginal itching and that she gets frequent yeast infections when she is sick due to her diabetes.    Allergies  Allergen Reactions  . Exenatide Nausea Only    Caused nausea ([facility]) 10/28/2009  . Insulin Aspart Nausea Only    Caused nausea ([facility]) 10/28/2009  . Other Nausea Only    Caused nausea ([facility]) 10/28/2009  . Compazine [Prochlorperazine Edisylate] Rash    Outpatient Encounter Medications as of 10/19/2019  Medication Sig Note  . aspirin 81 MG tablet Take 81 mg by mouth daily.   . Cholecalciferol (VITAMIN D PO) Take 2,000 Units by mouth daily.    . ferrous fumarate  (HEMOCYTE - 106 MG FE) 325 (106 FE) MG TABS tablet Take 1 tablet by mouth daily.   [name] FLUoxetine (PROZAC) 20 MG capsule TAKE 1 CAPSULE BY MOUTH EVERY DAY   . gabapentin (NEURONTIN) 300 MG capsule Take 1 capsule (300 mg total) by mouth 2 (two) times daily.   [name] HUMULIN R U-500 KWIKPEN 500 UNIT/ML kwikpen INJECT 65 UNITS 3 TIMES DAILY AT 8AM, 2PM AND 8 PM 10/19/2019: 70 units TID  . Multiple Vitamin (MULTIVITAMIN) tablet Take 1 tablet by mouth daily.   [name] omeprazole (PRILOSEC) 20 MG capsule Take 1 capsule (20 mg total) by mouth daily.   . ramipril (ALTACE) 2.5 MG capsule Take 1 capsule (2.5 mg total) by mouth daily.   . simvastatin (ZOCOR) 40 MG tablet Take 1 tablet (40 mg total) by mouth daily.   . traZODone (DESYREL) 100 MG tablet TAKE 1 TABLET BY MOUTH EVERYDAY AT BEDTIME   . vitamin B-12 (CYANOCOBALAMIN) 1000 MCG tablet Take 2,000 mcg by mouth daily.   . benzonatate (TESSALON) 100 MG capsule Take 2 capsules (200 mg total) by mouth 3 (three) times daily as needed for cough.   . fluconazole (DIFLUCAN) 150 MG tablet Take 1 tablet (150 mg total) by mouth once for 1 dose.   . fluticasone (FLONASE) 50 MCG/ACT nasal spray Place 2 sprays into both nostrils daily.    No facility-administered encounter medications on file as of 10/19/2019.   Patient Active Problem List   Diagnosis Date Noted  .  Suspected COVID-19 virus infection 10/19/2019  . Vaginal itching 10/19/2019  . Chronic bilateral low back pain with bilateral sciatica 04/17/2019  . Osteoarthritis 11/09/2017  . Anemia 09/28/2017  . Chronic kidney disease, stage 3 05/02/2017  . Closed fracture of left patella 04/27/2017  . Advanced care planning/counseling discussion 11/24/2016  . Insomnia 07/02/2015  . Depression 05/21/2015  . Hypertension 05/21/2015  . Diabetic neuropathy ([place]) 05/21/2015  . Hyperlipidemia associated with type 2 diabetes mellitus ([place]) 05/21/2015  . Chronic pain syndrome 05/21/2015  . Primary insomnia 05/21/2015  . GERD  (gastroesophageal reflux disease) 05/21/2015  . Uncontrolled type 2 diabetes mellitus with chronic kidney disease ([place]) 05/21/2015  . Long-term insulin use ([place]) 08/16/2014  . Type 2 diabetes mellitus with microalbuminuria ([place]) 08/16/2014    Past Medical History:  Diagnosis Date  . Anxiety   . Arthritis    knees  . Chronic pain   . CKD (chronic kidney disease), stage II   . Depression   . Diabetes mellitus without complication ([place])   . Diabetic neuropathy ([place])   . Fatigue    a. 05/2018 Echo: EF 60-65%, no rwma, nl RV fxn; b. 05/2018 Lexiscan MV: EF>65%. No ischemia/infarct.  . Hyperlipidemia   . Hypertension   . Insomnia   . Long term current use of insulin ([place])   . Peripheral neuropathy   . Vertigo    2x/month  . Wears dentures    full upper and lower     Review of Systems  Constitutional: Positive for activity change and fatigue. Negative for appetite change, chills and fever.  HENT: Positive for rhinorrhea, sneezing and sore throat. Negative for congestion, ear discharge, ear pain, facial swelling, postnasal drip, sinus pressure and sinus pain.   Eyes: Positive for redness and itching. Negative for pain and discharge.  Respiratory: Positive for cough (dry & nonproductive). Negative for chest tightness, shortness of breath and wheezing.   Cardiovascular: Positive for chest pain (pt thinks due to coughing). Negative for palpitations.  Gastrointestinal: Positive for diarrhea. Negative for nausea and vomiting.  Skin: Negative.  Negative for color change and rash.  Neurological: Positive for weakness (chronic). Negative for dizziness, light-headedness, numbness and headaches.  Hematological: Negative for adenopathy.  Psychiatric/Behavioral: Negative.  Negative for agitation, confusion and decreased concentration. The patient is not nervous/anxious.     Per HPI unless specifically indicated above     Objective:    LMP  (LMP Unknown)   Wt Readings from Last 3  Encounters:  03/27/19 176 lb (79.8 kg)  02/20/19 172 lb (78 kg)  08/15/18 150 lb 8 oz (68.3 kg)    Physical Exam Physical Exam was unable to be performed due to lack of equipment     Assessment & Plan:   Problem List Items Addressed This Visit      Musculoskeletal and Integument   Vaginal itching    Unable to physically examine today but given history of yeast infections and diabetes mellitus, 1x dose of antifungal given for probable yeast infection. Advised to return to clinic if symptoms persist or worsen.       Relevant Medications   fluconazole (DIFLUCAN) 150 MG tablet     Other   Suspected COVID-19 virus infection - Primary    Symptoms point to viral illness. Patient tested for COVID-19 today. Will follow up with results and PCP as patient is a good candidate for monoclonal antibodies if COVID+. In meantime, will treat symptoms symptomatically with nasal spray and antitussives, Rx sent to  pharmacy. Discussed isolation precautions. Patient advised to stay home except to get medical care. Separate from others in household if possible. Wear a mask whenever necessary to be around others. Wash hands often and avoid sharing personal household items with others. Clean and disinfect frequently touched household surfaces daily. Monitor symptoms return to clinic if they worsen; if severe chest pain or SOB, go to ED.       Relevant Medications   fluticasone (FLONASE) 50 MCG/ACT nasal spray   benzonatate (TESSALON) 100 MG capsule       Follow up plan: Return if symptoms worsen or fail to improve.  This visit was completed via telephone due to the restrictions of the COVID-19 pandemic. All issues as above were discussed and addressed but no physical exam was performed. If it was felt that the patient should be evaluated in the office, they were directed there. The patient verbally consented to this visit. Patient was unable to complete an audio/visual visit due to Lack of  equipment. . Location of the patient: home . Location of the provider: work . Those involved with this call:  . Provider: Carnella Guadalajara, DNP . CMA: Yvonna Alanis, CMA . Front Desk/Registration: Jill Side  . Time spent on call: 20 minutes on the phone discussing health concerns. 10 minutes total spent in review of patient's record and preparation of their chart.

## 2019-10-20 LAB — NOVEL CORONAVIRUS, NAA: SARS-CoV-2, NAA: DETECTED — AB

## 2019-10-21 ENCOUNTER — Other Ambulatory Visit: Payer: Self-pay | Admitting: Nurse Practitioner

## 2019-10-21 DIAGNOSIS — U071 COVID-19: Secondary | ICD-10-CM

## 2019-10-21 NOTE — Progress Notes (Signed)
  I connected by phone with Jessica Montoya on 10/21/2019 at 1:40 PM to discuss the potential use of an new treatment for mild to moderate COVID-19 viral infection in non-hospitalized patients.  This patient is a 69 y.o. female that meets the FDA criteria for Emergency Use Authorization of bamlanivimab or casirivimab\imdevimab.  Has a (+) direct SARS-CoV-2 viral test result  Has mild or moderate COVID-19   Is ? 70 years of age and weighs ? 40 kg  Is NOT hospitalized due to COVID-19  Is NOT requiring oxygen therapy or requiring an increase in baseline oxygen flow rate due to COVID-19  Is within 10 days of symptom onset  Has at least one of the high risk factor(s) for progression to severe COVID-19 and/or hospitalization as defined in EUA.  Specific high risk criteria : Diabetes, > 73 years of age, CKD.   I have spoken and communicated the following to the patient or parent/caregiver:  1. FDA has authorized the emergency use of bamlanivimab and casirivimab\imdevimab for the treatment of mild to moderate COVID-19 in adults and pediatric patients with positive results of direct SARS-CoV-2 viral testing who are 65 years of age and older weighing at least 40 kg, and who are at high risk for progressing to severe COVID-19 and/or hospitalization.  2. The significant known and potential risks and benefits of bamlanivimab and casirivimab\imdevimab, and the extent to which such potential risks and benefits are unknown.  3. Information on available alternative treatments and the risks and benefits of those alternatives, including clinical trials.  4. Patients treated with bamlanivimab and casirivimab\imdevimab should continue to self-isolate and use infection control measures (e.g., wear mask, isolate, social distance, avoid sharing personal items, clean and disinfect "high touch" surfaces, and frequent handwashing) according to CDC guidelines.   5. The patient or parent/caregiver has the option  to accept or refuse bamlanivimab or casirivimab\imdevimab .  After reviewing this information with the patient, The patient agreed to proceed with receiving the bamlanimivab infusion and will be provided a copy of the Fact sheet prior to receiving the infusion.[name] 10/21/2019 1:40 PM

## 2019-10-23 ENCOUNTER — Ambulatory Visit (HOSPITAL_COMMUNITY)
Admission: RE | Admit: 2019-10-23 | Discharge: 2019-10-23 | Disposition: A | Discharge status: Home / Self Care | Payer: Medicare Other | Source: Ambulatory Visit | Attending: Pulmonary Disease | Admitting: Pulmonary Disease

## 2019-10-23 DIAGNOSIS — U071 COVID-19: Secondary | ICD-10-CM | POA: Insufficient documentation

## 2019-10-23 DIAGNOSIS — Z23 Encounter for immunization: Secondary | ICD-10-CM | POA: Insufficient documentation

## 2019-10-23 MED ORDER — DIPHENHYDRAMINE HCL 50 MG/ML IJ SOLN: 50.0000 mg | Freq: Once | INTRAMUSCULAR | Status: DC | PRN

## 2019-10-23 MED ORDER — FAMOTIDINE IN NACL 20-0.9 MG/50ML-% IV SOLN: 20.0000 mg | Freq: Once | INTRAVENOUS | Status: DC | PRN

## 2019-10-23 MED ORDER — SODIUM CHLORIDE 0.9 % IV SOLN
700.0000 mg | Freq: Once | INTRAVENOUS | Status: AC
  Administered 2019-10-23: 700 mg via INTRAVENOUS
  Filled 2019-10-23: qty 20

## 2019-10-23 MED ORDER — SODIUM CHLORIDE 0.9 % IV SOLN: INTRAVENOUS | Status: DC | PRN

## 2019-10-23 MED ORDER — EPINEPHRINE 0.3 MG/0.3ML IJ SOAJ: 0.3000 mg | Freq: Once | INTRAMUSCULAR | Status: DC | PRN

## 2019-10-23 MED ORDER — ALBUTEROL SULFATE HFA 108 (90 BASE) MCG/ACT IN AERS: 2.0000 | INHALATION_SPRAY | Freq: Once | RESPIRATORY_TRACT | Status: DC | PRN

## 2019-10-23 MED ORDER — METHYLPREDNISOLONE SODIUM SUCC 125 MG IJ SOLR: 125.0000 mg | Freq: Once | INTRAMUSCULAR | Status: DC | PRN

## 2019-10-23 NOTE — Discharge Instructions (Signed)

## 2019-10-23 NOTE — Progress Notes (Signed)
  Diagnosis: COVID-19  Physician: Dr. Joya Gaskins  Procedure: [facility] Med: bamlanivimab infusion - Provided patient with bamlanimivab fact sheet for patients, parents and caregivers prior to infusion.  Complications: No immediate complications noted.  Discharge: Discharged home   Jessica Montoya, [name] 10/23/2019

## 2019-10-26 ENCOUNTER — Telehealth: Payer: Self-pay

## 2019-10-29 ENCOUNTER — Ambulatory Visit: Payer: Medicare Other | Admitting: Gastroenterology

## 2019-10-30 ENCOUNTER — Ambulatory Visit (INDEPENDENT_AMBULATORY_CARE_PROVIDER_SITE_OTHER): Payer: Medicare Other | Admitting: Nurse Practitioner

## 2019-10-30 ENCOUNTER — Encounter: Payer: Self-pay | Admitting: Nurse Practitioner

## 2019-10-30 ENCOUNTER — Other Ambulatory Visit: Payer: Self-pay

## 2019-10-30 VITALS — BP 104/58 | HR 86 | Temp 97.8°F | Wt 150.0 lb

## 2019-10-30 DIAGNOSIS — I129 Hypertensive chronic kidney disease with stage 1 through stage 4 chronic kidney disease, or unspecified chronic kidney disease: Secondary | ICD-10-CM

## 2019-10-30 DIAGNOSIS — E1122 Type 2 diabetes mellitus with diabetic chronic kidney disease: Secondary | ICD-10-CM

## 2019-10-30 DIAGNOSIS — E1142 Type 2 diabetes mellitus with diabetic polyneuropathy: Secondary | ICD-10-CM | POA: Diagnosis not present

## 2019-10-30 DIAGNOSIS — U071 COVID-19: Secondary | ICD-10-CM

## 2019-10-30 DIAGNOSIS — E1169 Type 2 diabetes mellitus with other specified complication: Secondary | ICD-10-CM

## 2019-10-30 DIAGNOSIS — N189 Chronic kidney disease, unspecified: Secondary | ICD-10-CM

## 2019-10-30 DIAGNOSIS — E1159 Type 2 diabetes mellitus with other circulatory complications: Secondary | ICD-10-CM | POA: Diagnosis not present

## 2019-10-30 DIAGNOSIS — E785 Hyperlipidemia, unspecified: Secondary | ICD-10-CM

## 2019-10-30 DIAGNOSIS — F331 Major depressive disorder, recurrent, moderate: Secondary | ICD-10-CM

## 2019-10-30 DIAGNOSIS — Z794 Long term (current) use of insulin: Secondary | ICD-10-CM

## 2019-10-30 DIAGNOSIS — I152 Hypertension secondary to endocrine disorders: Secondary | ICD-10-CM

## 2019-10-30 DIAGNOSIS — F5101 Primary insomnia: Secondary | ICD-10-CM

## 2019-10-30 DIAGNOSIS — IMO0002 Reserved for concepts with insufficient information to code with codable children: Secondary | ICD-10-CM

## 2019-10-30 DIAGNOSIS — E1165 Type 2 diabetes mellitus with hyperglycemia: Secondary | ICD-10-CM

## 2019-10-30 NOTE — Assessment & Plan Note (Addendum)
Chronic, ongoing, followed by endo.  A1C with 11.5% in October. Continue current regimen at this time and collaboration with endo + CCM collaboration.  May benefit from weekly injectable like Trulicity or SGLT2.  Due to recent Covid + will not bring in for labs at this time, plan on labs next visit.  Return in 3 months.

## 2019-10-30 NOTE — Assessment & Plan Note (Signed)
Acute and improving.  Continue to have ongoing fatigue and weakness.  Initial positive 10/19/2019.  Recommend continued rest, hydration + daily supplements like Vit D and Zinc.  Return for worsening or continued symptoms.

## 2019-10-30 NOTE — Assessment & Plan Note (Addendum)
Chronic, ongoing.  Continue Trazodone 100 MG at night which is offering some benefit.  Return in 3 months.  May benefit from sleep study in upcoming months due to insomnia and ongoing fatigue.

## 2019-10-30 NOTE — Progress Notes (Signed)
BP (!) 104/58   Pulse 86   Temp 97.8 F (36.6 C) (Oral)   Wt 150 lb (68 kg)   LMP  (LMP Unknown)   BMI 24.21 kg/m    Subjective:    Patient ID: Jessica Montoya, female    DOB: 05/05/1951, 69 y.o.   MRN: [id number]  HPI: Jessica Montoya is a 69 y.o. female  Chief Complaint  Patient presents with  . Diabetes  . Hyperlipidemia  . Hypertension  . Depression    . This visit was completed via telephone due to the restrictions of the COVID-19 pandemic. All issues as above were discussed and addressed but no physical exam was performed. If it was felt that the patient should be evaluated in the office, they were directed there. The patient verbally consented to this visit. Patient was unable to complete an audio/visual visit due to Lack of equipment. Due to the catastrophic nature of the COVID-19 pandemic, this visit was done through audio contact only. . Location of the patient: home . Location of the provider: work . Those involved with this call:  . Provider: [name], DNP . CMA: Yvonna Alanis, CMA . Front Desk/Registration: Don Perking  . Time spent on call: 15 minutes on the phone discussing health concerns. 10 minutes total spent in review of patient's record and preparation of their chart.  . I verified patient identity using two factors (patient name and date of birth). Patient consents verbally to being seen via telemedicine visit today.    COVID POSITIVE 10/19/2019 Had monoclonal antibody infusion on 10/23/2019 and reports overall feeling better, with exception of fatigue and weakness.  Her husband was positive as well and received infusion. Fever: no Cough: no Shortness of breath: no Wheezing: no Chest pain: no Chest tightness: no Chest congestion: no Nasal congestion: no Runny nose: no Post nasal drip: no Sneezing: no Sore throat: no Swollen glands: no Sinus pressure: no Headache: no Face pain: no Toothache: no Ear pain: none Ear  pressure: none Eyes red/itching:no Eye drainage/crusting: no  Vomiting: no Rash: no Fatigue: yes  DIABETES Is followed by Dr. [name], last saw her in December.  Taking U500 70 units TID. Has tried Metformin (GI upset), only oral medication she has tried (it is on last endo note as continuing to take, but patient reports not taking this ).  On review of [facility] notes her A1C was 11.5% last in October, at baseline runs 9-12 ranges. She is working with CCM team on medications and cost.  Continues on Gabapentin 300 MG BID.   Hypoglycemic episodes:none Polydipsia/polyuria: no Visual disturbance: no Chest pain: no Paresthesias: no Glucose Monitoring: yes             Accucheck frequency: TID             Fasting glucose: 161 this morning, 170-180 range             Post prandial: at 2 pm averages 160-170. Yesterday 162             Evening: at 8 pm ranges 180-190             Before meals:  Taking Insulin?: yes             Long acting insulin: U 500 70 units TID             Short acting insulin: Blood Pressure Monitoring: rarely Retinal Examination: Not up to Date Foot Exam: Up to Date Pneumovax: Up  to Date Influenza: Up to Date Aspirin: yes   HYPERTENSION / HYPERLIPIDEMIA Continues on Ramipril and Simvastatin.  She does endorse ongoing fatigue and snoring.   Satisfied with current treatment? yes Duration of hypertension: chronic BP monitoring frequency: daily BP range: 100/60's  BP medication side effects: no Duration of hyperlipidemia: chronic Cholesterol medication side effects: no Cholesterol supplements: none Medication compliance: good compliance Aspirin: no Recent stressors: no Recurrent headaches: no Visual changes: no Palpitations: no Dyspnea: no Chest pain: no Lower extremity edema: no Dizzy/lightheaded: no  DEPRESSION Continues on Prozac 20 MG and Trazodone 100 MG (increased to this dose in October -- reports this is helping some).  Discussed alternate medication  regimen options for mood and pain in future, such as Cymbalta and tapering off Prozac and Amitriptyline.  Mood status: controlled Satisfied with current treatment?: yes Symptom severity: mild  Duration of current treatment : chronic Side effects: no Medication compliance: good compliance Psychotherapy/counseling: none Depressed mood: no Anxious mood: no Anhedonia: no Significant weight loss or gain: no Insomnia: yes hard to fall asleep Fatigue: yes Feelings of worthlessness or guilt: no Impaired concentration/indecisiveness: no Suicidal ideations: no Hopelessness: no Crying spells: no Depression screen [facility] 2/9 10/30/2019 07/30/2019 04/17/2019 06/01/2018 09/28/2017  Decreased Interest 1 0 0 0 1  Down, Depressed, Hopeless 0 0 0 0 0  PHQ - 2 Score 1 0 0 0 1  Altered sleeping 3 3 3  - 3  Tired, decreased energy 3 3 3  - 3  Change in appetite 0 0 0 - 3  Feeling bad or failure about yourself  0 0 0 - 1  Trouble concentrating 1 0 0 - 1  Moving slowly or fidgety/restless 0 0 0 - 1  Suicidal thoughts 0 0 0 - 0  PHQ-9 Score 8 6 6  - 13  Difficult doing work/chores Not difficult at all Not difficult at all Not difficult at all - -    Relevant past medical, surgical, family and social history reviewed and updated as indicated. Interim medical history since our last visit reviewed. Allergies and medications reviewed and updated.  Review of Systems  Constitutional: Positive for fatigue. Negative for activity change, appetite change, diaphoresis and fever.  Respiratory: Negative for cough, chest tightness, shortness of breath and wheezing.   Cardiovascular: Negative for chest pain, palpitations and leg swelling.  Gastrointestinal: Negative for abdominal distention, abdominal pain, constipation, diarrhea, nausea and vomiting.  Endocrine: Negative for polydipsia, polyphagia and polyuria.  Neurological: Positive for weakness. Negative for dizziness, syncope, light-headedness, numbness and  headaches.  Psychiatric/Behavioral: Positive for sleep disturbance. Negative for decreased concentration, self-injury and suicidal ideas. The patient is not nervous/anxious.     Per HPI unless specifically indicated above     Objective:    BP (!) 104/58   Pulse 86   Temp 97.8 F (36.6 C) (Oral)   Wt 150 lb (68 kg)   LMP  (LMP Unknown)   BMI 24.21 kg/m   Wt Readings from Last 3 Encounters:  10/30/19 150 lb (68 kg)  03/27/19 176 lb (79.8 kg)  02/20/19 172 lb (78 kg)    Physical Exam   Unable to perform due to telephone visit only.  Results for orders placed or performed in visit on 10/19/19  Novel Coronavirus, NAA ([facility])   Specimen: Nasopharyngeal(NP) swabs in vial transport medium   NASOPHARYNGE  TESTING  Result Value Ref Range   SARS-CoV-2, NAA Detected (A) Not Detected      Assessment & Plan:   Problem  List Items Addressed This Visit      Cardiovascular and Mediastinum   Hypertension associated with diabetes ([place])    Chronic, stable with BP below goal.  Continue Altace for kidney protection.  Recommend she continue to monitor BP at home daily and document.  Will obtain labs at next visit.  Return in 3 months.        Endocrine   Diabetic neuropathy ([facility])    Chronic, ongoing, followed by endo.  A1C with 11.5% in October. Continue current regimen at this time and collaboration with endo + CCM collaboration.  Continue Gabapentin for neuropathy.  May benefit from weekly injectable like Trulicity or SGLT2.  Due to recent Covid + will not bring in for labs at this time, plan on labs next visit.  Return in 3 months.      Hyperlipidemia associated with type 2 diabetes mellitus ([facility])    Chronic, ongoing.  Continue current medication regimen and adjust as needed.  Obtain labs next visit, will not bring in at this time due to recent Covid + status.  Return in 3 months.      Uncontrolled type 2 diabetes mellitus with chronic kidney disease ([place]) - Primary    Chronic,  ongoing, followed by endo.  A1C with 11.5% in October. Continue current regimen at this time and collaboration with endo + CCM collaboration.  May benefit from weekly injectable like Trulicity or SGLT2.  Due to recent Covid + will not bring in for labs at this time, plan on labs next visit.  Return in 3 months.        Other   Depression    Chronic, ongoing.  Continue Prozac and  Trazodone 100 MG every night.  Consider reduction next visit and change from Prozac to Cymbalta which would benefit mood and pain level.  Denies SI/HI.  Return in 3 months.      Long-term insulin use ([facility])    Chronic, ongoing.  Continue collaboration with endocrinology.      Insomnia    Chronic, ongoing.  Continue Trazodone 100 MG at night which is offering some benefit.  Return in 3 months.  May benefit from sleep study in upcoming months due to insomnia and ongoing fatigue.      Lab test positive for detection of COVID-19 virus    Acute and improving.  Continue to have ongoing fatigue and weakness.  Initial positive 10/19/2019.  Recommend continued rest, hydration + daily supplements like Vit D and Zinc.  Return for worsening or continued symptoms.         I discussed the assessment and treatment plan with the patient. The patient was provided an opportunity to ask questions and all were answered. The patient agreed with the plan and demonstrated an understanding of the instructions.   The patient was advised to call back or seek an in-person evaluation if the symptoms worsen or if the condition fails to improve as anticipated.   I provided 15 minutes of time during this encounter.  Follow up plan: Return in about 3 months (around 01/28/2020) for T2DM, HTN/HLD, Mood, Chronic Pain.

## 2019-10-30 NOTE — Assessment & Plan Note (Signed)
Chronic, ongoing.  Continue collaboration with endocrinology.

## 2019-10-30 NOTE — Assessment & Plan Note (Signed)
Chronic, ongoing, followed by endo.  A1C with 11.5% in October. Continue current regimen at this time and collaboration with endo + CCM collaboration.  Continue Gabapentin for neuropathy.  May benefit from weekly injectable like Trulicity or SGLT2.  Due to recent Covid + will not bring in for labs at this time, plan on labs next visit.  Return in 3 months.

## 2019-10-30 NOTE — Assessment & Plan Note (Signed)
Chronic, ongoing.  Continue current medication regimen and adjust as needed.  Obtain labs next visit, will not bring in at this time due to recent Covid + status.  Return in 3 months.

## 2019-10-30 NOTE — Patient Instructions (Signed)
Carbohydrate Counting for Diabetes Mellitus, Adult  Carbohydrate counting is a method of keeping track of how many carbohydrates you eat. Eating carbohydrates naturally increases the amount of sugar (glucose) in the blood. Counting how many carbohydrates you eat helps keep your blood glucose within normal limits, which helps you manage your diabetes (diabetes mellitus). It is important to know how many carbohydrates you can safely have in each meal. This is different for every person. A diet and nutrition specialist (registered dietitian) can help you make a meal plan and calculate how many carbohydrates you should have at each meal and snack. Carbohydrates are found in the following foods:  Grains, such as breads and cereals.  Dried beans and soy products.  Starchy vegetables, such as potatoes, peas, and corn.  Fruit and fruit juices.  Milk and yogurt.  Sweets and snack foods, such as cake, cookies, candy, chips, and soft drinks. How do I count carbohydrates? There are two ways to count carbohydrates in food. You can use either of the methods or a combination of both. Reading "Nutrition Facts" on packaged food The "Nutrition Facts" list is included on the labels of almost all packaged foods and beverages in the [place] It includes:  The serving size.  Information about nutrients in each serving, including the grams (g) of carbohydrate per serving. To use the "Nutrition Facts":  Decide how many servings you will have.  Multiply the number of servings by the number of carbohydrates per serving.  The resulting number is the total amount of carbohydrates that you will be having. Learning standard serving sizes of other foods When you eat carbohydrate foods that are not packaged or do not include "Nutrition Facts" on the label, you need to measure the servings in order to count the amount of carbohydrates:  Measure the foods that you will eat with a food scale or measuring cup, if  needed.  Decide how many standard-size servings you will eat.  Multiply the number of servings by 15. Most carbohydrate-rich foods have about 15 g of carbohydrates per serving. ? For example, if you eat 8 oz (170 g) of strawberries, you will have eaten 2 servings and 30 g of carbohydrates (2 servings x 15 g = 30 g).  For foods that have more than one food mixed, such as soups and casseroles, you must count the carbohydrates in each food that is included. The following list contains standard serving sizes of common carbohydrate-rich foods. Each of these servings has about 15 g of carbohydrates:   hamburger bun or  English muffin.   oz (15 mL) syrup.   oz (14 g) jelly.  1 slice of bread.  1 six-inch tortilla.  3 oz (85 g) cooked rice or pasta.  4 oz (113 g) cooked dried beans.  4 oz (113 g) starchy vegetable, such as peas, corn, or potatoes.  4 oz (113 g) hot cereal.  4 oz (113 g) mashed potatoes or  of a large baked potato.  4 oz (113 g) canned or frozen fruit.  4 oz (120 mL) fruit juice.  4-6 crackers.  6 chicken nuggets.  6 oz (170 g) unsweetened dry cereal.  6 oz (170 g) plain fat-free yogurt or yogurt sweetened with artificial sweeteners.  8 oz (240 mL) milk.  8 oz (170 g) fresh fruit or one small piece of fruit.  24 oz (680 g) popped popcorn. Example of carbohydrate counting Sample meal  3 oz (85 g) chicken breast.  6 oz (170 g)   brown rice.  4 oz (113 g) corn.  8 oz (240 mL) milk.  8 oz (170 g) strawberries with sugar-free whipped topping. Carbohydrate calculation 1. Identify the foods that contain carbohydrates: ? Rice. ? Corn. ? Milk. ? Strawberries. 2. Calculate how many servings you have of each food: ? 2 servings rice. ? 1 serving corn. ? 1 serving milk. ? 1 serving strawberries. 3. Multiply each number of servings by 15 g: ? 2 servings rice x 15 g = 30 g. ? 1 serving corn x 15 g = 15 g. ? 1 serving milk x 15 g = 15 g. ? 1  serving strawberries x 15 g = 15 g. 4. Add together all of the amounts to find the total grams of carbohydrates eaten: ? 30 g + 15 g + 15 g + 15 g = 75 g of carbohydrates total. Summary  Carbohydrate counting is a method of keeping track of how many carbohydrates you eat.  Eating carbohydrates naturally increases the amount of sugar (glucose) in the blood.  Counting how many carbohydrates you eat helps keep your blood glucose within normal limits, which helps you manage your diabetes.  A diet and nutrition specialist (registered dietitian) can help you make a meal plan and calculate how many carbohydrates you should have at each meal and snack. This information is not intended to replace advice given to you by your health care provider. Make sure you discuss any questions you have with your health care provider. Document Revised: 04/14/2017 Document Reviewed: 03/03/2016 Elsevier Patient Education  2020 Elsevier Inc[employer]  

## 2019-10-30 NOTE — Assessment & Plan Note (Signed)
Chronic, stable with BP below goal.  Continue Altace for kidney protection.  Recommend she continue to monitor BP at home daily and document.  Will obtain labs at next visit.  Return in 3 months.

## 2019-10-30 NOTE — Assessment & Plan Note (Signed)
Chronic, ongoing.  Continue Prozac and  Trazodone 100 MG every night.  Consider reduction next visit and change from Prozac to Cymbalta which would benefit mood and pain level.  Denies SI/HI.  Return in 3 months.

## 2019-11-16 ENCOUNTER — Ambulatory Visit (INDEPENDENT_AMBULATORY_CARE_PROVIDER_SITE_OTHER): Payer: Medicare Other | Admitting: Pharmacist

## 2019-11-16 DIAGNOSIS — IMO0002 Reserved for concepts with insufficient information to code with codable children: Secondary | ICD-10-CM

## 2019-11-16 DIAGNOSIS — E1122 Type 2 diabetes mellitus with diabetic chronic kidney disease: Secondary | ICD-10-CM

## 2019-11-16 DIAGNOSIS — E1165 Type 2 diabetes mellitus with hyperglycemia: Secondary | ICD-10-CM | POA: Diagnosis not present

## 2019-11-16 NOTE — Chronic Care Management (AMB) (Signed)
Chronic Care Management   Follow Up Note   11/16/2019 Name: Jessica Montoya MRN: [id number] DOB: 1950/12/04  Referred by: [name], NP Reason for referral : Chronic Care Management (Medication Management)   Jessica Montoya is a 69 y.o. year old female who is a primary care patient of [name], [name], NP. The CCM team was consulted for assistance with chronic disease management and care coordination needs.   Contacted patient for medication management review.   Review of patient status, including review of consultants reports, relevant laboratory and other test results, and collaboration with appropriate care team members and the patient's provider was performed as part of comprehensive patient evaluation and provision of chronic care management services.    SDOH (Social Determinants of Health) screening performed today: Financial Strain . See Care Plan for related entries.   Outpatient Encounter Medications as of 11/16/2019  Medication Sig Note  . aspirin 81 MG tablet Take 81 mg by mouth daily.   . Cholecalciferol (VITAMIN D PO) Take 2,000 Units by mouth daily.    . ferrous fumarate (HEMOCYTE - 106 MG FE) 325 (106 FE) MG TABS tablet Take 1 tablet by mouth daily.   [name] FLUoxetine (PROZAC) 20 MG capsule TAKE 1 CAPSULE BY MOUTH EVERY DAY   . fluticasone (FLONASE) 50 MCG/ACT nasal spray Place 2 sprays into both nostrils daily. (Patient not taking: Reported on 10/30/2019)   . gabapentin (NEURONTIN) 300 MG capsule Take 1 capsule (300 mg total) by mouth 2 (two) times daily.   [name] HUMULIN R U-500 KWIKPEN 500 UNIT/ML kwikpen INJECT 65 UNITS 3 TIMES DAILY AT 8AM, 2PM AND 8 PM 10/19/2019: 70 units TID  . Multiple Vitamin (MULTIVITAMIN) tablet Take 1 tablet by mouth daily.   [name] omeprazole (PRILOSEC) 20 MG capsule Take 1 capsule (20 mg total) by mouth daily.   . ramipril (ALTACE) 2.5 MG capsule Take 1 capsule (2.5 mg total) by mouth daily.   . simvastatin (ZOCOR) 40 MG tablet Take 1  tablet (40 mg total) by mouth daily.   . traZODone (DESYREL) 100 MG tablet TAKE 1 TABLET BY MOUTH EVERYDAY AT BEDTIME   . vitamin B-12 (CYANOCOBALAMIN) 1000 MCG tablet Take 2,000 mcg by mouth daily.    No facility-administered encounter medications on file as of 11/16/2019.     Objective:   Goals Addressed            This Visit's Progress     Patient Stated   . PharmD "I can't afford my insulin" (pt-stated)       Current Barriers:  . Diabetes: uncontrolled; most recent A1c 11.5% o Notes still struggling s/p COVID diagnosis.  . Current antihyperglycemic regimen: Humulin R U500 65 units TID o Previously approved for Humulin R U500 assistance through [employer], need to reapply for 2021 . Cardiovascular risk reduction: o Current hypertensive regimen: ramipril 2.5 mg daily o Current hyperlipidemia regimen: simvastatin 40 mg daily  Pharmacist Clinical Goal(s):  [name] Over the next 90 days, patient with work with PharmD and primary care provider to address optimized glycemic regimen  Interventions: . Patient not feeling up to fully reviewing medications today . Due to reapply for [employer] patient assistance for 2021. Will apply for Humulin R U500; will plan to let Dr. Joycie Peek office know that if approved, we could also get Trulicity for her for free. Will collaborate w/ office staff to mail patient her application for [employer]. Patient aware that she will need to mail back 1) application  2) copies of proof of income.   Patient Self Care Activities:  . Patient will check blood glucose BID , document, and provide at future appointments . Patient will take medications as prescribed . Patient will report any questions or concerns to provider   Please see past updates related to this goal by clicking on the "Past Updates" button in the selected goal          Plan:  - Will collaborate w/ patient, provider, and office staff as above - Scheduled f/u call 12/07/19 for med review  Catie  Darnelle Maffucci, PharmD, [place] [phone]

## 2019-11-16 NOTE — Patient Instructions (Signed)
Visit Information  Goals Addressed            This Visit's Progress     Patient Stated   . PharmD "I can't afford my insulin" (pt-stated)       Current Barriers:  . Diabetes: uncontrolled; most recent A1c 11.5% o Notes still struggling s/p COVID diagnosis.  . Current antihyperglycemic regimen: Humulin R U500 65 units TID o Previously approved for Humulin R U500 assistance through [employer], need to reapply for 2021 . Cardiovascular risk reduction: o Current hypertensive regimen: ramipril 2.5 mg daily o Current hyperlipidemia regimen: simvastatin 40 mg daily  Pharmacist Clinical Goal(s):  [name] Over the next 90 days, patient with work with PharmD and primary care provider to address optimized glycemic regimen  Interventions: . Patient not feeling up to fully reviewing medications today . Due to reapply for [employer] patient assistance for 2021. Will apply for Humulin R U500; will plan to let Dr. Joycie Peek office know that if approved, we could also get Trulicity for her for free. Will collaborate w/ office staff to mail patient her application for [employer]. Patient aware that she will need to mail back 1) application 2) copies of proof of income.   Patient Self Care Activities:  . Patient will check blood glucose BID , document, and provide at future appointments . Patient will take medications as prescribed . Patient will report any questions or concerns to provider   Please see past updates related to this goal by clicking on the "Past Updates" button in the selected goal         The patient verbalized understanding of instructions provided today and declined a print copy of patient instruction materials.   Plan:  - Will collaborate w/ patient, provider, and office staff as above - Scheduled f/u call 12/07/19 for med review  [name], PharmD, [place] [phone]

## 2019-11-19 ENCOUNTER — Ambulatory Visit: Payer: Medicare Other | Admitting: Gastroenterology

## 2019-11-27 ENCOUNTER — Telehealth: Payer: Self-pay | Admitting: Cardiovascular Disease

## 2019-11-27 NOTE — Telephone Encounter (Signed)
3 attempts to schedule fu appt from recall list.   Deleting recall.   

## 2019-11-28 ENCOUNTER — Telehealth: Payer: Self-pay | Admitting: Nurse Practitioner

## 2019-11-28 ENCOUNTER — Ambulatory Visit (INDEPENDENT_AMBULATORY_CARE_PROVIDER_SITE_OTHER): Payer: Medicare Other

## 2019-11-28 VITALS — BP 108/61 | HR 85 | Temp 97.1°F | Ht 66.0 in | Wt 150.0 lb

## 2019-11-28 DIAGNOSIS — Z0000 Encounter for general adult medical examination without abnormal findings: Secondary | ICD-10-CM | POA: Diagnosis not present

## 2019-11-28 NOTE — Patient Instructions (Signed)
Jessica Montoya , Thank you for taking time to come for your Medicare Wellness Visit. I appreciate your ongoing commitment to your health goals. Please review the following plan we discussed and let me know if I can assist you in the future.   Screening recommendations/referrals: Colonoscopy: scheduled  Mammogram: Please call [phone] to schedule your mammogram.  Bone Density: up to date Recommended yearly ophthalmology/optometry visit for glaucoma screening and checkup Recommended yearly dental visit for hygiene and checkup  Vaccinations: Influenza vaccine: declined  Pneumococcal vaccine: up to date  Tdap vaccine: up to date  Shingles vaccine: shingrix eligible     Advanced directives: Advance directive discussed with you today.Once this is complete please bring a copy in to our office so we can scan it into your chart.  Conditions/risks identified: diabetic, recent falls.   Next appointment: Follow up in one year for your annual wellness visit    Preventive Care 65 Years and Older, Female Preventive care refers to lifestyle choices and visits with your health care provider that can promote health and wellness. What does preventive care include?  A yearly physical exam. This is also called an annual well check.  Dental exams once or twice a year.  Routine eye exams. Ask your health care provider how often you should have your eyes checked.  Personal lifestyle choices, including:  Daily care of your teeth and gums.  Regular physical activity.  Eating a healthy diet.  Avoiding tobacco and drug use.  Limiting alcohol use.  Practicing safe sex.  Taking low-dose aspirin every day.  Taking vitamin and mineral supplements as recommended by your health care provider. What happens during an annual well check? The services and screenings done by your health care provider during your annual well check will depend on your age, overall health, lifestyle risk factors, and family  history of disease. Counseling  Your health care provider may ask you questions about your:  Alcohol use.  Tobacco use.  Drug use.  Emotional well-being.  Home and relationship well-being.  Sexual activity.  Eating habits.  History of falls.  Memory and ability to understand (cognition).  Work and work Statistician.  Reproductive health. Screening  You may have the following tests or measurements:  Height, weight, and BMI.  Blood pressure.  Lipid and cholesterol levels. These may be checked every 5 years, or more frequently if you are over 79 years old.  Skin check.  Lung cancer screening. You may have this screening every year starting at age 51 if you have a 30-pack-year history of smoking and currently smoke or have quit within the past 15 years.  Fecal occult blood test (FOBT) of the stool. You may have this test every year starting at age 64.  Flexible sigmoidoscopy or colonoscopy. You may have a sigmoidoscopy every 5 years or a colonoscopy every 10 years starting at age 21.  Hepatitis C blood test.  Hepatitis B blood test.  Sexually transmitted disease (STD) testing.  Diabetes screening. This is done by checking your blood sugar (glucose) after you have not eaten for a while (fasting). You may have this done every 1-3 years.  Bone density scan. This is done to screen for osteoporosis. You may have this done starting at age 48.  Mammogram. This may be done every 1-2 years. Talk to your health care provider about how often you should have regular mammograms. Talk with your health care provider about your test results, treatment options, and if necessary, the need for more  tests. Vaccines  Your health care provider may recommend certain vaccines, such as:  Influenza vaccine. This is recommended every year.  Tetanus, diphtheria, and acellular pertussis (Tdap, Td) vaccine. You may need a Td booster every 10 years.  Zoster vaccine. You may need this after  age 33.  Pneumococcal 13-valent conjugate (PCV13) vaccine. One dose is recommended after age 76.  Pneumococcal polysaccharide (PPSV23) vaccine. One dose is recommended after age 65. Talk to your health care provider about which screenings and vaccines you need and how often you need them. This information is not intended to replace advice given to you by your health care provider. Make sure you discuss any questions you have with your health care provider. Document Released: 10/17/2015 Document Revised: 06/09/2016 Document Reviewed: 07/22/2015 Elsevier Interactive Patient Education  2017 [employer] Prevention in the Home Falls can cause injuries. They can happen to people of all ages. There are many things you can do to make your home safe and to help prevent falls. What can I do on the outside of my home?  Regularly fix the edges of walkways and driveways and fix any cracks.  Remove anything that might make you trip as you walk through a door, such as a raised step or threshold.  Trim any bushes or trees on the path to your home.  Use bright outdoor lighting.  Clear any walking paths of anything that might make someone trip, such as rocks or tools.  Regularly check to see if handrails are loose or broken. Make sure that both sides of any steps have handrails.  Any raised decks and porches should have guardrails on the edges.  Have any leaves, snow, or ice cleared regularly.  Use sand or salt on walking paths during winter.  Clean up any spills in your garage right away. This includes oil or grease spills. What can I do in the bathroom?  Use night lights.  Install grab bars by the toilet and in the tub and shower. Do not use towel bars as grab bars.  Use non-skid mats or decals in the tub or shower.  If you need to sit down in the shower, use a plastic, non-slip stool.  Keep the floor dry. Clean up any water that spills on the floor as soon as it  happens.  Remove soap buildup in the tub or shower regularly.  Attach bath mats securely with double-sided non-slip rug tape.  Do not have throw rugs and other things on the floor that can make you trip. What can I do in the bedroom?  Use night lights.  Make sure that you have a light by your bed that is easy to reach.  Do not use any sheets or blankets that are too big for your bed. They should not hang down onto the floor.  Have a firm chair that has side arms. You can use this for support while you get dressed.  Do not have throw rugs and other things on the floor that can make you trip. What can I do in the kitchen?  Clean up any spills right away.  Avoid walking on wet floors.  Keep items that you use a lot in easy-to-reach places.  If you need to reach something above you, use a strong step stool that has a grab bar.  Keep electrical cords out of the way.  Do not use floor polish or wax that makes floors slippery. If you must use wax, use non-skid floor  wax.  Do not have throw rugs and other things on the floor that can make you trip. What can I do with my stairs?  Do not leave any items on the stairs.  Make sure that there are handrails on both sides of the stairs and use them. Fix handrails that are broken or loose. Make sure that handrails are as long as the stairways.  Check any carpeting to make sure that it is firmly attached to the stairs. Fix any carpet that is loose or worn.  Avoid having throw rugs at the top or bottom of the stairs. If you do have throw rugs, attach them to the floor with carpet tape.  Make sure that you have a light switch at the top of the stairs and the bottom of the stairs. If you do not have them, ask someone to add them for you. What else can I do to help prevent falls?  Wear shoes that:  Do not have high heels.  Have rubber bottoms.  Are comfortable and fit you well.  Are closed at the toe. Do not wear sandals.  If you  use a stepladder:  Make sure that it is fully opened. Do not climb a closed stepladder.  Make sure that both sides of the stepladder are locked into place.  Ask someone to hold it for you, if possible.  Clearly mark and make sure that you can see:  Any grab bars or handrails.  First and last steps.  Where the edge of each step is.  Use tools that help you move around (mobility aids) if they are needed. These include:  Canes.  Walkers.  Scooters.  Crutches.  Turn on the lights when you go into a dark area. Replace any light bulbs as soon as they burn out.  Set up your furniture so you have a clear path. Avoid moving your furniture around.  If any of your floors are uneven, fix them.  If there are any pets around you, be aware of where they are.  Review your medicines with your doctor. Some medicines can make you feel dizzy. This can increase your chance of falling. Ask your doctor what other things that you can do to help prevent falls. This information is not intended to replace advice given to you by your health care provider. Make sure you discuss any questions you have with your health care provider. Document Released: 07/17/2009 Document Revised: 02/26/2016 Document Reviewed: 10/25/2014 Elsevier Interactive Patient Education  2017 [employer].

## 2019-11-28 NOTE — Progress Notes (Signed)
Subjective:   Jessica Montoya is a 69 y.o. female who presents for Medicare Annual (Subsequent) preventive examination.  This visit is being conducted via phone call  - after an attmept to do on video chat - due to the COVID-19 pandemic. This patient has given me verbal consent via phone to conduct this visit, patient states they are participating from their home address. Some vital signs may be absent or patient reported.   Patient identification: identified by name, DOB, and current address.    Review of Systems:   Cardiac Risk Factors include: advanced age (>23men, >85 women);dyslipidemia;hypertension     Objective:     Vitals: BP 108/61   Pulse 85   Temp (!) 97.1 F (36.2 C)   Ht 5\' 6"  (1.676 m)   Wt 150 lb (68 kg)   LMP  (LMP Unknown)   BMI 24.21 kg/m   Body mass index is 24.21 kg/m.  Advanced Directives 11/28/2019 03/27/2019 02/20/2019 06/01/2018 04/18/2017 08/06/2015 08/06/2015  Does Patient Have a Medical Advance Directive? No No No No No No No  Would patient like information on creating a medical advance directive? - No - Patient declined No - Patient declined Yes (MAU/Ambulatory/Procedural Areas - Information given) - Yes - Educational materials given -    Tobacco Social History   Tobacco Use  Smoking Status Never Smoker  Smokeless Tobacco Never Used     Counseling given: Not Answered   Clinical Intake:  Pre-visit preparation completed: Yes  Pain : No/denies pain     Nutritional Risks: None Diabetes: No  How often do you need to have someone help you when you read instructions, pamphlets, or other written materials from your doctor or pharmacy?: 1 - Never  Interpreter Needed?: No  Information entered by :: Destan Franchini,LPN  Past Medical History:  Diagnosis Date  . Anxiety   . Arthritis    knees  . Chronic pain   . CKD (chronic kidney disease), stage II   . Depression   . Diabetes mellitus without complication ([place])   . Diabetic neuropathy  ([place])   . Fatigue    a. 05/2018 Echo: EF 60-65%, no rwma, nl RV fxn; b. 05/2018 Lexiscan MV: EF>65%. No ischemia/infarct.  . Hyperlipidemia   . Hypertension   . Insomnia   . Long term current use of insulin ([place])   . Peripheral neuropathy   . Vertigo    2x/month  . Wears dentures    full upper and lower   Past Surgical History:  Procedure Laterality Date  . ABDOMINAL HYSTERECTOMY    . CATARACT EXTRACTION W/PHACO Left 02/20/2019   Procedure: CATARACT EXTRACTION PHACO AND INTRAOCULAR LENS PLACEMENT ([place]) LEFT DIABETES;  Surgeon: Birder Robson, MD;  Location: [facility];  Service: Ophthalmology;  Laterality: Left;  . CATARACT EXTRACTION W/PHACO Right 03/27/2019   Procedure: CATARACT EXTRACTION PHACO AND INTRAOCULAR LENS PLACEMENT ([place])  RIGHT DIABETIC;  Surgeon: Birder Robson, MD;  Location: [facility];  Service: Ophthalmology;  Laterality: Right;  Diabetic - insulin  . COLON SURGERY     Family History  Problem Relation Age of Onset  . Cancer Mother        melanoma  . Breast cancer Mother 65  . Cancer Father        pancreatic  . Hypertension Maternal Grandmother   . Cancer Maternal Grandmother        colon  . Cancer Paternal Grandmother        stomach  .  Stroke Paternal Grandfather   . Cancer Maternal Grandfather        kidney   Social History   Socioeconomic History  . Marital status: Married    Spouse name: Not on file  . Number of children: Not on file  . Years of education: Not on file  . Highest education level: High school graduate  Occupational History  . Not on file  Tobacco Use  . Smoking status: Never Smoker  . Smokeless tobacco: Never Used  Substance and Sexual Activity  . Alcohol use: No    Alcohol/week: 0.0 standard drinks  . Drug use: No  . Sexual activity: Yes  Other Topics Concern  . Not on file  Social History Narrative  . Not on file   Social Determinants of Health   Financial Resource Strain: Low Risk   . Difficulty  of Paying Living Expenses: Not very hard  Food Insecurity: No Food Insecurity  . Worried About Charity fundraiser in the Last Year: Never true  . Ran Out of Food in the Last Year: Never true  Transportation Needs: No Transportation Needs  . Lack of Transportation (Medical): No  . Lack of Transportation (Non-Medical): No  Physical Activity: Inactive  . Days of Exercise per Week: 0 days  . Minutes of Exercise per Session: 0 min  Stress:   . Feeling of Stress : Not on file  Social Connections: Somewhat Isolated  . Frequency of Communication with Friends and Family: More than three times a week  . Frequency of Social Gatherings with Friends and Family: More than three times a week  . Attends Religious Services: Never  . Active Member of Clubs or Organizations: No  . Attends Archivist Meetings: Never  . Marital Status: Married    Outpatient Encounter Medications as of 11/28/2019  Medication Sig  . aspirin 81 MG tablet Take 81 mg by mouth daily.  . Cholecalciferol (VITAMIN D PO) Take 2,000 Units by mouth daily.   . ferrous fumarate (HEMOCYTE - 106 MG FE) 325 (106 FE) MG TABS tablet Take 1 tablet by mouth daily.  [name] FLUoxetine (PROZAC) 20 MG capsule TAKE 1 CAPSULE BY MOUTH EVERY DAY  . gabapentin (NEURONTIN) 300 MG capsule Take 1 capsule (300 mg total) by mouth 2 (two) times daily.  [name] HUMULIN R U-500 KWIKPEN 500 UNIT/ML kwikpen INJECT 65 UNITS 3 TIMES DAILY AT 8AM, 2PM AND 8 PM  . Multiple Vitamin (MULTIVITAMIN) tablet Take 1 tablet by mouth daily.  [name] omeprazole (PRILOSEC) 20 MG capsule Take 1 capsule (20 mg total) by mouth daily.  . ramipril (ALTACE) 2.5 MG capsule Take 1 capsule (2.5 mg total) by mouth daily.  . simvastatin (ZOCOR) 40 MG tablet Take 1 tablet (40 mg total) by mouth daily.  . traZODone (DESYREL) 100 MG tablet TAKE 1 TABLET BY MOUTH EVERYDAY AT BEDTIME  . vitamin B-12 (CYANOCOBALAMIN) 1000 MCG tablet Take 2,000 mcg by mouth daily.  . fluticasone (FLONASE) 50  MCG/ACT nasal spray Place 2 sprays into both nostrils daily. (Patient not taking: Reported on 10/30/2019)   No facility-administered encounter medications on file as of 11/28/2019.    Activities of Daily Living In your present state of health, do you have any difficulty performing the following activities: 11/28/2019 03/27/2019  Hearing? Y N  Comment no hearing aids -  Vision? N N  Comment eyeglasses, goes to [facility] -  Difficulty concentrating or making decisions? N N  Walking or climbing stairs? Darreld Mclean  N  Dressing or bathing? N N  Comment bathing- taking time. uses grab bars -  Doing errands, shopping? N -  Comment husband helps -  Conservation officer, nature and eating ? N -  Using the Toilet? N -  In the past six months, have you accidently leaked urine? N -  Do you have problems with loss of bowel control? N -  Managing your Medications? N -  Managing your Finances? N -  Housekeeping or managing your Housekeeping? N -  Comment husband helps -  Some recent data might be hidden    Patient Care Team: [name], NP as PCP - General (Nurse Practitioner) Wellington Hampshire, MD as PCP - Cardiology (Cardiology) Vladimir Crofts, MD (Neurology) Wellington Hampshire, MD as Consulting Physician (Cardiology) Solum, Betsey Holiday, MD as Physician Assistant (Endocrinology) De Hollingshead, [facility] as Pharmacist (Pharmacist)    Assessment:   This is a routine wellness examination for Sophiana.  Exercise Activities and Dietary recommendations Current Exercise Habits: The patient does not participate in regular exercise at present, Exercise limited by: None identified  Goals    . DIET - INCREASE WATER INTAKE     Recommend drinking at least 6-8 glasses of water a day     . PharmD "I can't afford my insulin" (pt-stated)     Current Barriers:  . Diabetes: uncontrolled; most recent A1c 11.5% o Notes still struggling s/p COVID diagnosis.  . Current antihyperglycemic regimen: Humulin R U500 65 units  TID o Previously approved for Humulin R U500 assistance through [employer], need to reapply for 2021 . Cardiovascular risk reduction: o Current hypertensive regimen: ramipril 2.5 mg daily o Current hyperlipidemia regimen: simvastatin 40 mg daily  Pharmacist Clinical Goal(s):  [name] Over the next 90 days, patient with work with PharmD and primary care provider to address optimized glycemic regimen  Interventions: . Patient not feeling up to fully reviewing medications today . Due to reapply for [employer] patient assistance for 2021. Will apply for Humulin R U500; will plan to let Dr. Joycie Peek office know that if approved, we could also get Trulicity for her for free. Will collaborate w/ office staff to mail patient her application for [employer]. Patient aware that she will need to mail back 1) application 2) copies of proof of income.   Patient Self Care Activities:  . Patient will check blood glucose BID , document, and provide at future appointments . Patient will take medications as prescribed . Patient will report any questions or concerns to provider   Please see past updates related to this goal by clicking on the "Past Updates" button in the selected goal         Fall Risk: Fall Risk  11/28/2019 04/17/2019 06/01/2018 05/11/2018 03/01/2018  Falls in the past year? 1 1 Yes No No  Number falls in past yr: 0 0 1 - -  Injury with Fall? 1 0 No - -  Risk for fall due to : Impaired balance/gait - - - -  Follow up Falls prevention discussed Falls evaluation completed - - -    FALL RISK PREVENTION PERTAINING TO THE HOME:  Any stairs in or around the home? No  If so, are there any without handrails? No   Home free of loose throw rugs in walkways, pet beds, electrical cords, etc? Yes  Adequate lighting in your home to reduce risk of falls? Yes   ASSISTIVE DEVICES UTILIZED TO PREVENT FALLS:  Life alert? No  Use of a cane, walker or w/c? Yes  cane Grab bars in the bathroom? Yes  Shower  chair or bench in shower? No  Elevated toilet seat or a handicapped toilet? Yes   DME ORDERS:  DME order needed?  No   TIMED UP AND GO:  Unable to perform    Depression Screen PHQ 2/9 Scores 11/28/2019 10/30/2019 07/30/2019 04/17/2019  PHQ - 2 Score 1 1 0 0  PHQ- 9 Score - 8 6 6      Cognitive Function     6CIT Screen 06/01/2018  What Year? 0 points  What month? 0 points  What time? 0 points  Count back from 20 0 points  Months in reverse 0 points  Repeat phrase 0 points  Total Score 0    Immunization History  Administered Date(s) Administered  . Pneumococcal Conjugate-13 08/06/2015  . Pneumococcal Polysaccharide-23 03/06/2005, 11/24/2016  . Td 10/04/2005  . Zoster 01/14/2014    Qualifies for Shingles Vaccine? Yes  Zostavax completed 2015. Due for Shingrix. Education has been provided regarding the importance of this vaccine. Pt has been advised to call insurance company to determine out of pocket expense. Advised may also receive vaccine at local pharmacy or Health Dept. Verbalized acceptance and understanding.  Tdap: Discussed need for TD/TDAP vaccine, patient verbalized understanding that this is not covered as a preventative with there insurance and to call the office if she  develops any new skin injuries, ie: cuts, scrapes, bug bites, or open wounds.  Flu Vaccine: deckined   Pneumococcal Vaccine: up to date   Covid 36: information provided.   Screening Tests Health Maintenance  Topic Date Due  . COLONOSCOPY  09/11/2018  . OPHTHALMOLOGY EXAM  11/14/2019  . INFLUENZA VACCINE  01/02/2020 (Originally 05/05/2019)  . TETANUS/TDAP  07/29/2020 (Originally 10/05/2015)  . HEMOGLOBIN A1C  01/28/2020  . FOOT EXAM  07/29/2020  . MAMMOGRAM  10/16/2020  . DEXA SCAN  Completed  . Hepatitis C Screening  Completed  . PNA vac Low Risk Adult  Completed    Cancer Screenings:  Colorectal Screening: Completed 2014. Repeat every 5 years;  Scheduled with Dr.Jenga for this year.  Hx of colon cancer.    Mammogram: Completed 10/16/2018. Repeat every year; ordered.   Bone Density: 10/11/2017  Lung Cancer Screening: (Low Dose CT Chest recommended if Age 29-80 years, 30 pack-year currently smoking OR have quit w/in 15years.) does not qualify.     Additional Screening:  Hepatitis C Screening: does qualify; Completed 2016  Vision Screening: Recommended annual ophthalmology exams for early detection of glaucoma and other disorders of the eye. Is the patient up to date with their annual eye exam?  Yes  Who is the provider or what is the name of the office in which the pt attends annual eye exams? [facility]  Dental Screening: Recommended annual dental exams for proper oral hygiene  Community Resource Referral:  CRR required this visit?  No       Plan:  I have personally reviewed and addressed the Medicare Annual Wellness questionnaire and have noted the following in the patient's chart:  A. Medical and social history B. Use of alcohol, tobacco or illicit drugs  C. Current medications and supplements D. Functional ability and status E.  Nutritional status F.  Physical activity G. Advance directives H. List of other physicians I.  Hospitalizations, surgeries, and ER visits in previous 12 months J.  [place] such as hearing and vision if needed, cognitive and  depression L. Referrals and appointments   In addition, I have reviewed and discussed with patient certain preventive protocols, quality metrics, and best practice recommendations. A written personalized care plan for preventive services as well as general preventive health recommendations were provided to patient.  Signed,    Bevelyn Ngo, LPN  [id number] Nurse Health Advisor   Nurse Notes: dizziness is worsening. Had recent fall about 2 weeks ago due to dizziness. Discussed with Jolene, would like to see patient in person. Will have scheduler call to make appointment.

## 2019-11-28 NOTE — Telephone Encounter (Signed)
Called pt to schedule in person f/u for dizziness per Jolene, no answer, left vm

## 2019-11-29 ENCOUNTER — Telehealth: Payer: Self-pay | Admitting: Nurse Practitioner

## 2019-11-29 ENCOUNTER — Other Ambulatory Visit: Payer: Self-pay | Admitting: Nurse Practitioner

## 2019-11-29 MED ORDER — FLUCONAZOLE 150 MG PO TABS: 150.0000 mg | ORAL_TABLET | Freq: Once | ORAL | 0 refills | Status: AC

## 2019-11-29 NOTE — Telephone Encounter (Signed)
Please let patient know I sent in one dose Diflucan for yeast infection, she does need to hold Simvastatin for 48 hours after taking this medication to avoid interaction please.  Thank you.

## 2019-11-29 NOTE — Telephone Encounter (Signed)
Called pt to schedule f/u scheduled for 3/8 pt wanted to let provider know that she needs something sent in for a yeast infection. Please advise.

## 2019-11-29 NOTE — Telephone Encounter (Signed)
Called pt, no answer, LVM per DPR.

## 2019-11-30 ENCOUNTER — Telehealth: Payer: Self-pay | Admitting: Nurse Practitioner

## 2019-11-30 NOTE — Chronic Care Management (AMB) (Signed)
  Chronic Care Management   Note  11/30/2019 Name: Saanvi Malia MRN: [id number] DOB: [birth year]  Katresa Growden is a 69 y.o. year old female who is a primary care patient of [name], [name], NP. Deangelo Dunford is currently enrolled in care management services. An additional referral for RN Case manager was placed.   Follow up plan: Unsuccessful telephone outreach attempt made. A HIPPA compliant phone message was left for the patient providing contact information and requesting a return call.  The care management team will reach out to the patient again over the next 7 days.  If patient returns call to provider office, please advise to call Embedded Care Management Care Guide Glenna Durand LPN at [phone]  Darlinda Bellows, LPN Health Advisor, [facility] Management ??[email or web address] ??[phone]

## 2019-12-04 ENCOUNTER — Ambulatory Visit: Payer: Self-pay | Admitting: Pharmacist

## 2019-12-04 DIAGNOSIS — IMO0002 Reserved for concepts with insufficient information to code with codable children: Secondary | ICD-10-CM

## 2019-12-04 DIAGNOSIS — E1122 Type 2 diabetes mellitus with diabetic chronic kidney disease: Secondary | ICD-10-CM

## 2019-12-04 NOTE — Chronic Care Management (AMB) (Deleted)
Chronic Care Management   Follow Up Note   12/04/2019 Name: Jessica Montoya MRN: [id number] DOB: Jul 29, 1951  Referred by: [name], NP Reason for referral : Chronic Care Management (Medication Management)   [name] is a 69 y.o. year old female who is a primary care patient of [name], [name], NP. The CCM team was consulted for assistance with chronic disease management and care coordination needs.    Care coordi  Review of patient status, including review of consultants reports, relevant laboratory and other test results, and collaboration with appropriate care team members and the patient's provider was performed as part of comprehensive patient evaluation and provision of chronic care management services.    SDOH (Social Determinants of Health) assessments performed: {yes/no:20286} See Care Plan activities for detailed interventions related to [facility])     Outpatient Encounter Medications as of 12/04/2019  Medication Sig Note  . aspirin 81 MG tablet Take 81 mg by mouth daily.   . Cholecalciferol (VITAMIN D PO) Take 2,000 Units by mouth daily.    . ferrous fumarate (HEMOCYTE - 106 MG FE) 325 (106 FE) MG TABS tablet Take 1 tablet by mouth daily.   [name] FLUoxetine (PROZAC) 20 MG capsule TAKE 1 CAPSULE BY MOUTH EVERY DAY   . fluticasone (FLONASE) 50 MCG/ACT nasal spray Place 2 sprays into both nostrils daily. (Patient not taking: Reported on 10/30/2019)   . gabapentin (NEURONTIN) 300 MG capsule Take 1 capsule (300 mg total) by mouth 2 (two) times daily.   [name] HUMULIN R U-500 KWIKPEN 500 UNIT/ML kwikpen INJECT 65 UNITS 3 TIMES DAILY AT 8AM, 2PM AND 8 PM 10/19/2019: 70 units TID  . Multiple Vitamin (MULTIVITAMIN) tablet Take 1 tablet by mouth daily.   [name] omeprazole (PRILOSEC) 20 MG capsule Take 1 capsule (20 mg total) by mouth daily.   . ramipril (ALTACE) 2.5 MG capsule Take 1 capsule (2.5 mg total) by mouth daily.   . simvastatin (ZOCOR) 40 MG tablet Take 1 tablet (40 mg  total) by mouth daily.   . traZODone (DESYREL) 100 MG tablet TAKE 1 TABLET BY MOUTH EVERYDAY AT BEDTIME   . vitamin B-12 (CYANOCOBALAMIN) 1000 MCG tablet Take 2,000 mcg by mouth daily.    No facility-administered encounter medications on file as of 12/04/2019.     Objective:   Goals Addressed            This Visit's Progress     Patient Stated   . PharmD "I can't afford my insulin" (pt-stated)       Current Barriers:  . Diabetes: uncontrolled; most recent A1c 11.5% . Current antihyperglycemic regimen: Humulin R U500 65 units TID o Previously approved for Humulin R U500 assistance through [employer], needs to reapply for 2021 . Cardiovascular risk reduction: o Current hypertensive regimen: ramipril 2.5 mg daily o Current hyperlipidemia regimen: simvastatin 40 mg daily  Pharmacist Clinical Goal(s):  [name] Over the next 90 days, patient with work with PharmD and primary care provider to address optimized glycemic regimen  Interventions: . Received patient portion of application, as well as income information. Will fax provider portion of Humulin [id number] application to Dr. [name] at [facility]. Once received back, will submit to [facility]   Patient Self Care Activities:  . Patient will check blood glucose BID , document, and provide at future appointments . Patient will take medications as prescribed . Patient will report any questions or concerns to provider   Please see past updates related to  this goal by clicking on the "Past Updates" button in the selected goal          Plan:   {CCM FOLLOW UP PLAN:22241}   SIGNATURE

## 2019-12-04 NOTE — Chronic Care Management (AMB) (Signed)
Chronic Care Management   Follow Up Note   12/04/2019 Name: Jessica Montoya MRN: [id number] DOB: 1951/09/01  Referred by: [name], NP Reason for referral : Chronic Care Management (Medication Management)   Jessica Montoya is a 69 y.o. year old female who is a primary care patient of [name], Jessica Faster, NP. The CCM team was consulted for assistance with chronic disease management and care coordination needs.    Care coordination completed today  Review of patient status, including review of consultants reports, relevant laboratory and other test results, and collaboration with appropriate care team members and the patient's provider was performed as part of comprehensive patient evaluation and provision of chronic care management services.    SDOH (Social Determinants of Health) assessments performed: Yes See Care Plan activities for detailed interventions related to SDOH)  SDOH Interventions     Most Recent Value  SDOH Interventions  SDOH Interventions for the Following Domains  Financial Strain  Financial Strain Interventions  Other (Comment) [Patient assistance application]       Outpatient Encounter Medications as of 12/04/2019  Medication Sig Note  . aspirin 81 MG tablet Take 81 mg by mouth daily.   . Cholecalciferol (VITAMIN D PO) Take 2,000 Units by mouth daily.    . ferrous fumarate (HEMOCYTE - 106 MG FE) 325 (106 FE) MG TABS tablet Take 1 tablet by mouth daily.   [name] FLUoxetine (PROZAC) 20 MG capsule TAKE 1 CAPSULE BY MOUTH EVERY DAY   . fluticasone (FLONASE) 50 MCG/ACT nasal spray Place 2 sprays into both nostrils daily. (Patient not taking: Reported on 10/30/2019)   . gabapentin (NEURONTIN) 300 MG capsule Take 1 capsule (300 mg total) by mouth 2 (two) times daily.   [name] HUMULIN R U-500 KWIKPEN 500 UNIT/ML kwikpen INJECT 65 UNITS 3 TIMES DAILY AT 8AM, 2PM AND 8 PM 10/19/2019: 70 units TID  . Multiple Vitamin (MULTIVITAMIN) tablet Take 1 tablet by mouth daily.   [name]  omeprazole (PRILOSEC) 20 MG capsule Take 1 capsule (20 mg total) by mouth daily.   . ramipril (ALTACE) 2.5 MG capsule Take 1 capsule (2.5 mg total) by mouth daily.   . simvastatin (ZOCOR) 40 MG tablet Take 1 tablet (40 mg total) by mouth daily.   . traZODone (DESYREL) 100 MG tablet TAKE 1 TABLET BY MOUTH EVERYDAY AT BEDTIME   . vitamin B-12 (CYANOCOBALAMIN) 1000 MCG tablet Take 2,000 mcg by mouth daily.    No facility-administered encounter medications on file as of 12/04/2019.     Objective:   Goals Addressed            This Visit's Progress     Patient Stated   . PharmD "I can't afford my insulin" (pt-stated)       Current Barriers:  . Diabetes: uncontrolled; most recent A1c 11.5% . Current antihyperglycemic regimen: Humulin R U500 65 units TID o Previously approved for Humulin R U500 assistance through [employer], needs to reapply for 2021 . Cardiovascular risk reduction: o Current hypertensive regimen: ramipril 2.5 mg daily o Current hyperlipidemia regimen: simvastatin 40 mg daily  Pharmacist Clinical Goal(s):  [name] Over the next 90 days, patient with work with PharmD and primary care provider to address optimized glycemic regimen  Interventions: . Received patient portion of application, as well as income information. Will fax provider portion of Humulin [id number] application to Dr. [name] at [facility]. Once received back, will submit to [facility]   Patient Self Care Activities:  . Patient  will check blood glucose BID , document, and provide at future appointments . Patient will take medications as prescribed . Patient will report any questions or concerns to provider   Please see past updates related to this goal by clicking on the "Past Updates" button in the selected goal          Plan:  - Will collaborate w/ endocrinology as above  [name], PharmD, [place] [phone]

## 2019-12-04 NOTE — Patient Instructions (Signed)
Visit Information  Goals Addressed            This Visit's Progress     Patient Stated   . PharmD "I can't afford my insulin" (pt-stated)       Current Barriers:  . Diabetes: uncontrolled; most recent A1c 11.5% . Current antihyperglycemic regimen: Humulin R U500 65 units TID o Previously approved for Humulin R U500 assistance through [employer], needs to reapply for 2021 . Cardiovascular risk reduction: o Current hypertensive regimen: ramipril 2.5 mg daily o Current hyperlipidemia regimen: simvastatin 40 mg daily  Pharmacist Clinical Goal(s):  [name] Over the next 90 days, patient with work with PharmD and primary care provider to address optimized glycemic regimen  Interventions: . Received patient portion of application, as well as income information. Will fax provider portion of Humulin [id number] application to Dr. [name] at [facility]. Once received back, will submit to [facility]   Patient Self Care Activities:  . Patient will check blood glucose BID , document, and provide at future appointments . Patient will take medications as prescribed . Patient will report any questions or concerns to provider   Please see past updates related to this goal by clicking on the "Past Updates" button in the selected goal         Patient verbalizes understanding of instructions provided today.   Plan:  - Will collaborate w/ endocrinology as above  [name], PharmD, [place] [phone]

## 2019-12-07 ENCOUNTER — Telehealth: Payer: Self-pay

## 2019-12-07 NOTE — Chronic Care Management (AMB) (Signed)
  Chronic Care Management   Note  12/07/2019 Name: Sonaya Resseguie MRN: [id number] DOB: 1951/09/22  Shalay Heidel is a 69 y.o. year old female who is a primary care patient of [name], [name], NP. Tameeka Rutherford is currently enrolled in care management services. An additional referral for RN Case manager was placed.   Follow up plan: Telephone appointment with care management team member scheduled for:01/16/2020  Glenna Durand, LPN Health Advisor, [facility] ??[email or web address] ??[phone]

## 2019-12-10 ENCOUNTER — Other Ambulatory Visit: Payer: Self-pay | Admitting: Nurse Practitioner

## 2019-12-10 ENCOUNTER — Ambulatory Visit (INDEPENDENT_AMBULATORY_CARE_PROVIDER_SITE_OTHER): Payer: Medicare Other | Admitting: Nurse Practitioner

## 2019-12-10 ENCOUNTER — Telehealth: Payer: Self-pay | Admitting: Nurse Practitioner

## 2019-12-10 ENCOUNTER — Other Ambulatory Visit: Payer: Self-pay

## 2019-12-10 ENCOUNTER — Encounter: Payer: Self-pay | Admitting: Nurse Practitioner

## 2019-12-10 VITALS — BP 120/81 | HR 81 | Temp 97.4°F

## 2019-12-10 DIAGNOSIS — E1122 Type 2 diabetes mellitus with diabetic chronic kidney disease: Secondary | ICD-10-CM | POA: Diagnosis not present

## 2019-12-10 DIAGNOSIS — E559 Vitamin D deficiency, unspecified: Secondary | ICD-10-CM

## 2019-12-10 DIAGNOSIS — R42 Dizziness and giddiness: Secondary | ICD-10-CM | POA: Insufficient documentation

## 2019-12-10 DIAGNOSIS — R809 Proteinuria, unspecified: Secondary | ICD-10-CM

## 2019-12-10 DIAGNOSIS — Z85038 Personal history of other malignant neoplasm of large intestine: Secondary | ICD-10-CM

## 2019-12-10 DIAGNOSIS — E538 Deficiency of other specified B group vitamins: Secondary | ICD-10-CM | POA: Diagnosis not present

## 2019-12-10 DIAGNOSIS — E1159 Type 2 diabetes mellitus with other circulatory complications: Secondary | ICD-10-CM | POA: Diagnosis not present

## 2019-12-10 DIAGNOSIS — E785 Hyperlipidemia, unspecified: Secondary | ICD-10-CM | POA: Diagnosis not present

## 2019-12-10 DIAGNOSIS — E1129 Type 2 diabetes mellitus with other diabetic kidney complication: Secondary | ICD-10-CM

## 2019-12-10 DIAGNOSIS — E1165 Type 2 diabetes mellitus with hyperglycemia: Secondary | ICD-10-CM

## 2019-12-10 DIAGNOSIS — E1169 Type 2 diabetes mellitus with other specified complication: Secondary | ICD-10-CM

## 2019-12-10 DIAGNOSIS — F331 Major depressive disorder, recurrent, moderate: Secondary | ICD-10-CM

## 2019-12-10 DIAGNOSIS — IMO0002 Reserved for concepts with insufficient information to code with codable children: Secondary | ICD-10-CM

## 2019-12-10 DIAGNOSIS — I10 Essential (primary) hypertension: Secondary | ICD-10-CM | POA: Diagnosis not present

## 2019-12-10 DIAGNOSIS — Z794 Long term (current) use of insulin: Secondary | ICD-10-CM

## 2019-12-10 LAB — MICROALBUMIN, URINE WAIVED
Creatinine, Urine Waived: 10 mg/dL (ref 10–300)
Microalb, Ur Waived: 30 mg/L — ABNORMAL HIGH (ref 0–19)

## 2019-12-10 LAB — BAYER DCA HB A1C WAIVED: HB A1C (BAYER DCA - WAIVED): 11.7 % — ABNORMAL HIGH (ref <7.0)

## 2019-12-10 MED ORDER — FLUOXETINE HCL 40 MG PO CAPS: 40.0000 mg | ORAL_CAPSULE | Freq: Every day | ORAL | 3 refills | Status: DC

## 2019-12-10 MED ORDER — HYDROXYZINE PAMOATE 25 MG PO CAPS: 25.0000 mg | ORAL_CAPSULE | Freq: Two times a day (BID) | ORAL | 0 refills | Status: DC | PRN

## 2019-12-10 MED ORDER — TRAZODONE HCL 50 MG PO TABS: 50.0000 mg | ORAL_TABLET | Freq: Every day | ORAL | 3 refills | Status: DC

## 2019-12-10 NOTE — Assessment & Plan Note (Signed)
Chronic, ongoing.  Continue current medication regimen and adjust as needed.  Obtain lipid panel today.  Return in 3 months.

## 2019-12-10 NOTE — Assessment & Plan Note (Signed)
Chronic, ongoing. Increased anxiety recently post-Covid. GAD 12 and PHQ9 8 today.   Will increase Prozac to 40 MG and continue Trazodone 50 MG every night.  Denies SI/HI.  Return in 4 weeks for mood check.

## 2019-12-10 NOTE — Assessment & Plan Note (Signed)
Ongoing, continue daily supplement and recheck Vit D level today.

## 2019-12-10 NOTE — Assessment & Plan Note (Signed)
Ongoing, continue daily supplement and recheck B12 level today.

## 2019-12-10 NOTE — Assessment & Plan Note (Signed)
Is scheduled to see GI in April, check TSH today due to patient concerns.

## 2019-12-10 NOTE — Assessment & Plan Note (Signed)
Chronic, ongoing, followed by endo.  A1C with 11.7% A1C today. Continue current regimen at this time, defer changes to endo coming up, and collaboration with endo + CCM collaboration.  May benefit from weekly injectable like Trulicity or SGLT2.  Urine micro ALB 30 and A:C 30-300.  Return in 3 months.

## 2019-12-10 NOTE — Assessment & Plan Note (Addendum)
Ongoing post-Covid, suspect post-viral vertigo.  Has had vertigo in past on and off.  Will trial very low dose Vistaril as needed and discussed she is only to take this if needed, has been taking OTC Dramamine with benefit.  Recommend increased fluid intake at home and compression hose on during day and off at night.  Obtain labs today to include CMP and TSH.  Recommend PT, she declines at this time.  Return in 4 weeks.  If ongoing consider referral to ENT or neuro.

## 2019-12-10 NOTE — Progress Notes (Signed)
BP 120/81   Pulse 81   Temp (!) 97.4 F (36.3 C) (Oral)   LMP  (LMP Unknown)   SpO2 98%    Subjective:    Patient ID: Jessica Montoya, female    DOB: Feb 16, 1951, 69 y.o.   MRN: [id number]  HPI: Jessica Montoya is a 69 y.o. female  Chief Complaint  Patient presents with  . Dizziness    pt states she has been having on and off headaches/dizziness since having COVID in January, gradually getting worse  . Anxiety    pt states she has been feeling more increased anxiety lately   DIZZINESS Diagnosed with Covid 10/19/2019 and treated with MAB infusion on 10/23/2019.  Ongoing weakness since Covid and fatigue.  Had a fall on 12/05/2019, lost her balance and used cane to stop her from hitting ground hard.  Then 3 weeks prior to that she ran into a cabinet, scraped forehead on left side.  Was having headaches with Covid, then this even caused increased headaches, these are getting better.    Dizziness is ongoing, some days it will last more than 1/2 day.  Took some pills for motion sickness, Dramamine,  last week and this helped.  Was told years ago she had vertigo and should sleep on two pillows, was ENT provider.  On review has also been seen by neurology in past for similar symptoms, in 2016 Dr. Manuella Ghazi  She reports she is also scheduled to see Dr. Marius Ditch with GI on 01/28/2020, as she is concerned some of the symptoms she has had recently are similar to ones she had prior to Colon CA in past, 2014.  Has had dry throat and occasional issues swallowing.   Last B12 level 371 -- takes daily supplement and Vit D level 29.9 -- takes daily supplement. Duration: months Description of symptoms: lightheaded -- feels off kilter Duration of episode: a few minutes Dizziness frequency: recurrent Provoking factors: none Aggravating factors:  none Triggered by rolling over in bed: no Triggered by bending over: sometimes Aggravated by head movement: yes Aggravated by exertion, coughing, loud noises:  whistling drives her crazy Recent head injury: no Recent or current viral symptoms: yes History of vasovagal episodes: no Nausea: yes, at times Vomiting: no Tinnitus: no Hearing loss: no Aural fullness: no Headache: occasional, with Covid Photophobia/phonophobia: occasional to light Unsteady gait: yes Postural instability: no Diplopia, dysarthria, dysphagia or weakness: occasionally seeing flashes of light Related to exertion: no Pallor: no Diaphoresis: no Dyspnea: no Chest pain: no   ANXIETY/STRESS At this time takes Prozac 20 MG and Trazodone 50 MG daily.  Having increased anxiety since Covid diagnosis and concern with health issues. Duration:stable Anxious mood: yes Excessive worrying: yes Irritability: yes  Sweating: no Nausea: yes Palpitations:no Hyperventilation: no Panic attacks: yes Agoraphobia: no  Obscessions/compulsions: no Depressed mood: yes Depression screen [facility] 2/9 12/10/2019 11/28/2019 10/30/2019 07/30/2019 04/17/2019  Decreased Interest 0 0 1 0 0  Down, Depressed, Hopeless 2 1 0 0 0  PHQ - 2 Score 2 1 1  0 0  Altered sleeping 3 - 3 3 3   Tired, decreased energy 3 - 3 3 3   Change in appetite 0 - 0 0 0  Feeling bad or failure about yourself  0 - 0 0 0  Trouble concentrating 0 - 1 0 0  Moving slowly or fidgety/restless 0 - 0 0 0  Suicidal thoughts 0 - 0 0 0  PHQ-9 Score 8 - 8 6 6   Difficult doing work/chores  Somewhat difficult - Not difficult at all Not difficult at all Not difficult at all   Anhedonia: no Weight changes: no Insomnia: yes hard to fall asleep  Hypersomnia: no Fatigue/loss of energy: yes Feelings of worthlessness: no Feelings of guilt: no Impaired concentration/indecisiveness: yes Suicidal ideations: no  Crying spells: no Recent Stressors/Life Changes: no   Relationship problems: no   Family stress: no     Financial stress: no    Job stress: no    Recent death/loss: no GAD 7 : Generalized Anxiety Score 12/10/2019 04/17/2019  Nervous,  Anxious, on Edge 3 1  Control/stop worrying 3 1  Worry too much - different things 3 1  Trouble relaxing 1 1  Restless 1 0  Easily annoyed or irritable 1 0  Afraid - awful might happen 0 0  Total GAD 7 Score 12 4  Anxiety Difficulty Somewhat difficult Not difficult at all    DIABETES Is followed by Dr. [name], last saw her in March.  Taking U500 70 units TID.  Has tried Metformin (GI upset), only oral medication she has tried (it is on last endo note as continuing to take, but patient reports not taking this ).  Has not tried weekly injectables. A1C in October was 11.5%  Is scheduled to return to endo on 01/07/2020.  Is being followed by CCM team for assistance with insulin.   Hypoglycemic episodes: had on 60 a couple weeks ago Polydipsia/polyuria: no Visual disturbance: no Chest pain: no Paresthesias: no Glucose Monitoring: yes             Accucheck frequency: TID             Fasting glucose: 200 range             Post prandial: at 2 pm averages 160-170             Evening: at 8 pm ranges 250             Before meals:  Taking Insulin?: yes             Long acting insulin: U 500 70 units TID             Short acting insulin: Blood Pressure Monitoring: rarely Retinal Examination: Not up to Date Foot Exam: Up to Date Pneumovax: Up to Date Influenza: Up to Date Aspirin: yes   HYPERTENSION / HYPERLIPIDEMIA Continues on Ramipril and Simvastatin.  Is seeing cardiologist, April 2021, Dr. Fletcher Anon.   Satisfied with current treatment? yes Duration of hypertension: chronic BP monitoring frequency: rarely BP range: 120/60's at home BP medication side effects: no Duration of hyperlipidemia: chronic Cholesterol medication side effects: no Cholesterol supplements: none Medication compliance: good compliance Aspirin: no Recent stressors: no Recurrent headaches: no Visual changes: no Palpitations: no Dyspnea: no Chest pain: no Lower extremity edema: no Dizzy/lightheaded: no    Relevant past medical, surgical, family and social history reviewed and updated as indicated. Interim medical history since our last visit reviewed. Allergies and medications reviewed and updated.  Review of Systems  Constitutional: Negative for activity change, appetite change, diaphoresis, fatigue and fever.  Respiratory: Negative for cough, chest tightness and shortness of breath.   Cardiovascular: Negative for chest pain, palpitations and leg swelling.  Gastrointestinal: Negative.   Endocrine: Negative for cold intolerance, heat intolerance, polydipsia, polyphagia and polyuria.  Neurological: Positive for dizziness and headaches (improving). Negative for seizures, facial asymmetry, speech difficulty, weakness, light-headedness and numbness.  Psychiatric/Behavioral: Positive for decreased concentration and sleep  disturbance. Negative for self-injury and suicidal ideas. The patient is nervous/anxious.     Per HPI unless specifically indicated above     Objective:    BP 120/81   Pulse 81   Temp (!) 97.4 F (36.3 C) (Oral)   LMP  (LMP Unknown)   SpO2 98%   Wt Readings from Last 3 Encounters:  11/28/19 150 lb (68 kg)  10/30/19 150 lb (68 kg)  03/27/19 176 lb (79.8 kg)    Physical Exam Vitals and nursing note reviewed.  Constitutional:      General: She is awake. She is not in acute distress.    Appearance: She is well-developed and well-groomed. She is not ill-appearing.  HENT:     Head: Normocephalic.     Right Ear: Hearing, tympanic membrane, ear canal and external ear normal.     Left Ear: Hearing, tympanic membrane, ear canal and external ear normal.  Eyes:     General: Lids are normal.        Right eye: No discharge.        Left eye: No discharge.     Conjunctiva/sclera: Conjunctivae normal.     Pupils: Pupils are equal, round, and reactive to light.  Neck:     Thyroid: No thyromegaly.     Vascular: No carotid bruit.  Cardiovascular:     Rate and Rhythm: Normal  rate and regular rhythm.     Heart sounds: Normal heart sounds. No murmur. No gallop.   Pulmonary:     Effort: Pulmonary effort is normal. No accessory muscle usage or respiratory distress.     Breath sounds: Normal breath sounds.  Abdominal:     General: Bowel sounds are normal.     Palpations: Abdomen is soft.     Tenderness: There is no abdominal tenderness.  Musculoskeletal:     Cervical back: Normal range of motion and neck supple.     Right lower leg: No edema.     Left lower leg: No edema.  Skin:    General: Skin is warm and dry.  Neurological:     Mental Status: She is alert and oriented to person, place, and time.     Cranial Nerves: Cranial nerves are intact.     Coordination: Coordination is intact.     Gait: Gait is intact.     Deep Tendon Reflexes: Reflexes are normal and symmetric.     Reflex Scores:      Brachioradialis reflexes are 2+ on the right side and 2+ on the left side.      Patellar reflexes are 2+ on the right side and 2+ on the left side.    Comments: Uses cane to assist gait.  Psychiatric:        Attention and Perception: Attention normal.        Mood and Affect: Mood normal.        Speech: Speech normal.        Behavior: Behavior normal. Behavior is cooperative.     Results for orders placed or performed in visit on 12/10/19  Microalbumin, Urine Waived  Result Value Ref Range   Microalb, Ur Waived 30 (H) 0 - 19 mg/L   Creatinine, Urine Waived 10 10 - 300 mg/dL   Microalb/Creat Ratio 30-300 (H) <30 mg/g  Bayer DCA Hb A1c Waived  Result Value Ref Range   HB A1C (BAYER DCA - WAIVED) 11.7 (H) <7.0 %      Assessment & Plan:   Problem  List Items Addressed This Visit      Cardiovascular and Mediastinum   Hypertension associated with diabetes ([place])    Chronic, stable with BP below goal.  Continue Altace for kidney protection.  Recommend she continue to monitor BP at home daily and document.  CMP today.  Return in 3 months.      Relevant Orders    Microalbumin, Urine Waived (Completed)   Bayer DCA Hb A1c Waived (Completed)     Endocrine   Hyperlipidemia associated with type 2 diabetes mellitus (HCC)    Chronic, ongoing.  Continue current medication regimen and adjust as needed.  Obtain lipid panel today.  Return in 3 months.      Relevant Orders   Comprehensive metabolic panel   Lipid Panel w/o Chol/HDL Ratio   Bayer DCA Hb A1c Waived (Completed)   Uncontrolled type 2 diabetes mellitus with chronic kidney disease (HCC)    Chronic, ongoing, followed by endo.  A1C with 11.7% A1C today. Continue current regimen at this time, defer changes to endo coming up, and collaboration with endo + CCM collaboration.  May benefit from weekly injectable like Trulicity or SGLT2.  Urine micro ALB 30 and A:C 30-300.  Return in 3 months.      Relevant Orders   Microalbumin, Urine Waived (Completed)   Bayer DCA Hb A1c Waived (Completed)   Type 2 diabetes mellitus with microalbuminuria (HCC) - Primary    Chronic, ongoing, followed by endo.  A1C 11.7% today and urine micro ALB 30 + A:C 30-300.  Chronic, ongoing, followed by endo.  Continue Gabapentin for neuropathy + Altace for kidney protection.  May benefit from weekly injectable like Trulicity or SGLT2.  Will discuss option of Freestyle Libre monitor with CCM PharmD, as patient would greatly benefit from this.  Return in 3 months.        Other   Depression    Chronic, ongoing. Increased anxiety recently post-Covid. GAD 12 and PHQ9 8 today.   Will increase Prozac to 40 MG and continue Trazodone 50 MG every night.  Denies SI/HI.  Return in 4 weeks for mood check.      Relevant Medications   FLUoxetine (PROZAC) 40 MG capsule   traZODone (DESYREL) 50 MG tablet   hydrOXYzine (VISTARIL) 25 MG capsule   Vitamin D deficiency    Ongoing, continue daily supplement and recheck Vit D level today.      Relevant Orders   VITAMIN D 25 Hydroxy (Vit-D Deficiency, Fractures)   Vitamin B12 deficiency     Ongoing, continue daily supplement and recheck B12 level today.      Relevant Orders   Vitamin B12   Dizziness    Ongoing post-Covid, suspect post-viral vertigo.  Has had vertigo in past on and off.  Will trial very low dose Vistaril as needed and discussed she is only to take this if needed, has been taking OTC Dramamine with benefit.  Recommend increased fluid intake at home and compression hose on during day and off at night.  Obtain labs today to include CMP and TSH.      Relevant Orders   TSH   History of colon cancer    Is scheduled to see GI in April, check TSH today due to patient concerns.            Follow up plan: Return in about 4 weeks (around 01/07/2020) for Mood and dizziness.

## 2019-12-10 NOTE — Assessment & Plan Note (Signed)
Chronic, ongoing, followed by endo.  A1C 11.7% today and urine micro ALB 30 + A:C 30-300.  Chronic, ongoing, followed by endo.  Continue Gabapentin for neuropathy + Altace for kidney protection.  May benefit from weekly injectable like Trulicity or SGLT2.  Will discuss option of Freestyle Libre monitor with CCM PharmD, as patient would greatly benefit from this.  Return in 3 months.

## 2019-12-10 NOTE — Telephone Encounter (Signed)
Called and spoke with patient. She states that she has been having the headaches off and on since a fall she had after COVID. States it is nothing COVID related.

## 2019-12-10 NOTE — Assessment & Plan Note (Signed)
Chronic, stable with BP below goal.  Continue Altace for kidney protection.  Recommend she continue to monitor BP at home daily and document.  CMP today.  Return in 3 months.

## 2019-12-10 NOTE — Telephone Encounter (Signed)
Copied from [place] [phone]. Topic: Clinical - COVID Pre-Screen >> Dec 10, 2019  8:38 AM Celene Kras wrote:  1. To the best of your knowledge, have you been in close contact with anyone with a confirmed diagnosis of COVID 19?  no  If no - Proceed to next question; If yes - Schedule patient for a virtual visit  2. Have you had any one or more of the following: fever, chills, cough, shortness of breath or any flu-like symptoms?  no  If no - Proceed to next question; If yes - Schedule patient for a virtual visit  3. Have you been diagnosed with or have a previous diagnosis of COVID 19?  No 01/21  If no - Proceed to next question; If yes - Schedule patient for a virtual visit  4. I am going to go over a few other symptoms with you. Please let me know if you are experiencing any of the following: yes, headaches  Ear, nose or throat discomfort  A sore throat  Headache  Muscle pain  Diarrhea  Loss of taste or smell  If no - Continue with scheduling process; If yes - Document in scheduling notes   Thank you for answering these questions. Please know we will ask you these questions or similar questions when you arrive for your appointment and again it's how we are keeping everyone safe. Also, to keep you safe, please use the provided hand sanitizer when you enter the building. Huey Bienen[place], we are asking everyone in the building to wear a mask because they help [place] prevent the spread of germs.   Do you have a mask of your own, if not, we are happy to provide one for you. The last thing I want to go over with you is the no visitor guidelines. This means no one can attend the appointment with you unless you need physical assistance. I understand this may be different from your past appointments and I know this may be difficult but please know if someone is driving you we are happy to call them for you once your appointment is over.

## 2019-12-10 NOTE — Patient Instructions (Signed)

## 2019-12-11 ENCOUNTER — Emergency Department: Payer: Medicare Other

## 2019-12-11 ENCOUNTER — Encounter: Payer: Self-pay | Admitting: Emergency Medicine

## 2019-12-11 ENCOUNTER — Observation Stay
Admission: EM | Admit: 2019-12-11 | Discharge: 2019-12-12 | Disposition: A | Discharge status: Home / Self Care | Payer: Medicare Other | Attending: Internal Medicine | Admitting: Internal Medicine

## 2019-12-11 ENCOUNTER — Ambulatory Visit: Payer: Self-pay | Admitting: Pharmacist

## 2019-12-11 ENCOUNTER — Other Ambulatory Visit: Payer: Self-pay

## 2019-12-11 DIAGNOSIS — F329 Major depressive disorder, single episode, unspecified: Secondary | ICD-10-CM | POA: Diagnosis not present

## 2019-12-11 DIAGNOSIS — E1140 Type 2 diabetes mellitus with diabetic neuropathy, unspecified: Secondary | ICD-10-CM | POA: Diagnosis not present

## 2019-12-11 DIAGNOSIS — R300 Dysuria: Secondary | ICD-10-CM

## 2019-12-11 DIAGNOSIS — E871 Hypo-osmolality and hyponatremia: Secondary | ICD-10-CM | POA: Diagnosis not present

## 2019-12-11 DIAGNOSIS — IMO0002 Reserved for concepts with insufficient information to code with codable children: Secondary | ICD-10-CM

## 2019-12-11 DIAGNOSIS — Z79899 Other long term (current) drug therapy: Secondary | ICD-10-CM | POA: Diagnosis not present

## 2019-12-11 DIAGNOSIS — R8281 Pyuria: Secondary | ICD-10-CM | POA: Diagnosis present

## 2019-12-11 DIAGNOSIS — Z7982 Long term (current) use of aspirin: Secondary | ICD-10-CM | POA: Insufficient documentation

## 2019-12-11 DIAGNOSIS — F419 Anxiety disorder, unspecified: Secondary | ICD-10-CM | POA: Insufficient documentation

## 2019-12-11 DIAGNOSIS — E538 Deficiency of other specified B group vitamins: Secondary | ICD-10-CM | POA: Insufficient documentation

## 2019-12-11 DIAGNOSIS — I959 Hypotension, unspecified: Secondary | ICD-10-CM

## 2019-12-11 DIAGNOSIS — E1129 Type 2 diabetes mellitus with other diabetic kidney complication: Secondary | ICD-10-CM | POA: Diagnosis present

## 2019-12-11 DIAGNOSIS — E1142 Type 2 diabetes mellitus with diabetic polyneuropathy: Secondary | ICD-10-CM

## 2019-12-11 DIAGNOSIS — E876 Hypokalemia: Secondary | ICD-10-CM | POA: Diagnosis not present

## 2019-12-11 DIAGNOSIS — E559 Vitamin D deficiency, unspecified: Secondary | ICD-10-CM | POA: Insufficient documentation

## 2019-12-11 DIAGNOSIS — E785 Hyperlipidemia, unspecified: Secondary | ICD-10-CM | POA: Insufficient documentation

## 2019-12-11 DIAGNOSIS — G4700 Insomnia, unspecified: Secondary | ICD-10-CM | POA: Insufficient documentation

## 2019-12-11 DIAGNOSIS — I129 Hypertensive chronic kidney disease with stage 1 through stage 4 chronic kidney disease, or unspecified chronic kidney disease: Secondary | ICD-10-CM | POA: Insufficient documentation

## 2019-12-11 DIAGNOSIS — Z85038 Personal history of other malignant neoplasm of large intestine: Secondary | ICD-10-CM | POA: Insufficient documentation

## 2019-12-11 DIAGNOSIS — R809 Proteinuria, unspecified: Secondary | ICD-10-CM

## 2019-12-11 DIAGNOSIS — I10 Essential (primary) hypertension: Secondary | ICD-10-CM

## 2019-12-11 DIAGNOSIS — E1169 Type 2 diabetes mellitus with other specified complication: Secondary | ICD-10-CM | POA: Diagnosis not present

## 2019-12-11 DIAGNOSIS — K219 Gastro-esophageal reflux disease without esophagitis: Secondary | ICD-10-CM | POA: Diagnosis not present

## 2019-12-11 DIAGNOSIS — E1122 Type 2 diabetes mellitus with diabetic chronic kidney disease: Secondary | ICD-10-CM | POA: Diagnosis present

## 2019-12-11 DIAGNOSIS — E1165 Type 2 diabetes mellitus with hyperglycemia: Secondary | ICD-10-CM | POA: Insufficient documentation

## 2019-12-11 DIAGNOSIS — E11319 Type 2 diabetes mellitus with unspecified diabetic retinopathy without macular edema: Secondary | ICD-10-CM | POA: Insufficient documentation

## 2019-12-11 DIAGNOSIS — N179 Acute kidney failure, unspecified: Secondary | ICD-10-CM | POA: Diagnosis not present

## 2019-12-11 DIAGNOSIS — J9811 Atelectasis: Secondary | ICD-10-CM | POA: Diagnosis not present

## 2019-12-11 DIAGNOSIS — R799 Abnormal finding of blood chemistry, unspecified: Secondary | ICD-10-CM | POA: Diagnosis not present

## 2019-12-11 DIAGNOSIS — F32A Depression, unspecified: Secondary | ICD-10-CM | POA: Diagnosis present

## 2019-12-11 DIAGNOSIS — Z794 Long term (current) use of insulin: Secondary | ICD-10-CM | POA: Insufficient documentation

## 2019-12-11 DIAGNOSIS — D631 Anemia in chronic kidney disease: Secondary | ICD-10-CM | POA: Diagnosis not present

## 2019-12-11 DIAGNOSIS — I152 Hypertension secondary to endocrine disorders: Secondary | ICD-10-CM | POA: Diagnosis present

## 2019-12-11 DIAGNOSIS — Z8616 Personal history of COVID-19: Secondary | ICD-10-CM | POA: Diagnosis present

## 2019-12-11 DIAGNOSIS — F331 Major depressive disorder, recurrent, moderate: Secondary | ICD-10-CM | POA: Diagnosis not present

## 2019-12-11 DIAGNOSIS — N183 Chronic kidney disease, stage 3 unspecified: Secondary | ICD-10-CM | POA: Insufficient documentation

## 2019-12-11 DIAGNOSIS — E860 Dehydration: Secondary | ICD-10-CM | POA: Diagnosis present

## 2019-12-11 DIAGNOSIS — E1159 Type 2 diabetes mellitus with other circulatory complications: Secondary | ICD-10-CM | POA: Diagnosis present

## 2019-12-11 LAB — MAGNESIUM: Magnesium: 1.9 mg/dL (ref 1.7–2.4)

## 2019-12-11 LAB — BASIC METABOLIC PANEL
Anion gap: 15 (ref 5–15)
BUN: 23 mg/dL (ref 8–23)
CO2: 24 mmol/L (ref 22–32)
Calcium: 9.7 mg/dL (ref 8.9–10.3)
Chloride: 97 mmol/L — ABNORMAL LOW (ref 98–111)
Creatinine, Ser: 1.66 mg/dL — ABNORMAL HIGH (ref 0.44–1.00)
GFR calc Af Amer: 36 mL/min — ABNORMAL LOW (ref >60)
GFR calc non Af Amer: 31 mL/min — ABNORMAL LOW (ref >60)
Glucose, Bld: 139 mg/dL — ABNORMAL HIGH (ref 70–99)
Potassium: 3.2 mmol/L — ABNORMAL LOW (ref 3.5–5.1)
Sodium: 136 mmol/L (ref 135–145)

## 2019-12-11 LAB — COMPREHENSIVE METABOLIC PANEL
ALT: 36 IU/L — ABNORMAL HIGH (ref 0–32)
AST: 20 IU/L (ref 0–40)
Albumin/Globulin Ratio: 1.7 (ref 1.2–2.2)
Albumin: 4.5 g/dL (ref 3.8–4.8)
Alkaline Phosphatase: 71 IU/L (ref 39–117)
BUN/Creatinine Ratio: 18 (ref 12–28)
BUN: 19 mg/dL (ref 8–27)
Bilirubin Total: 0.8 mg/dL (ref 0.0–1.2)
CO2: 24 mmol/L (ref 20–29)
Calcium: 10.1 mg/dL (ref 8.7–10.3)
Chloride: 89 mmol/L — ABNORMAL LOW (ref 96–106)
Creatinine, Ser: 1.07 mg/dL — ABNORMAL HIGH (ref 0.57–1.00)
GFR calc Af Amer: 61 mL/min/{1.73_m2} (ref >59)
GFR calc non Af Amer: 53 mL/min/{1.73_m2} — ABNORMAL LOW (ref >59)
Globulin, Total: 2.6 g/dL (ref 1.5–4.5)
Glucose: 610 mg/dL (ref 65–99)
Potassium: 5 mmol/L (ref 3.5–5.2)
Sodium: 128 mmol/L — ABNORMAL LOW (ref 134–144)
Total Protein: 7.1 g/dL (ref 6.0–8.5)

## 2019-12-11 LAB — VITAMIN B12: Vitamin B-12: 1522 pg/mL — ABNORMAL HIGH (ref 232–1245)

## 2019-12-11 LAB — CBC
HCT: 39.5 % (ref 36.0–46.0)
Hemoglobin: 13.2 g/dL (ref 12.0–15.0)
MCH: 30.2 pg (ref 26.0–34.0)
MCHC: 33.4 g/dL (ref 30.0–36.0)
MCV: 90.4 fL (ref 80.0–100.0)
Platelets: 209 10*3/uL (ref 150–400)
RBC: 4.37 MIL/uL (ref 3.87–5.11)
RDW: 12.9 % (ref 11.5–15.5)
WBC: 11.3 10*3/uL — ABNORMAL HIGH (ref 4.0–10.5)
nRBC: 0 % (ref 0.0–0.2)

## 2019-12-11 LAB — LIPID PANEL W/O CHOL/HDL RATIO
Cholesterol, Total: 171 mg/dL (ref 100–199)
HDL: 65 mg/dL (ref >39)
LDL Chol Calc (NIH): 79 mg/dL (ref 0–99)
Triglycerides: 157 mg/dL — ABNORMAL HIGH (ref 0–149)
VLDL Cholesterol Cal: 27 mg/dL (ref 5–40)

## 2019-12-11 LAB — URINALYSIS, COMPLETE (UACMP) WITH MICROSCOPIC
Bilirubin Urine: NEGATIVE
Glucose, UA: >=500 mg/dL — AB
Ketones, ur: NEGATIVE mg/dL
Nitrite: NEGATIVE
Protein, ur: 30 mg/dL — AB
Specific Gravity, Urine: 1.02 (ref 1.005–1.030)
WBC, UA: >50 WBC/hpf — ABNORMAL HIGH (ref 0–5)
pH: 5 (ref 5.0–8.0)

## 2019-12-11 LAB — LACTIC ACID, PLASMA: Lactic Acid, Venous: 1.8 mmol/L (ref 0.5–1.9)

## 2019-12-11 LAB — TSH: TSH: 1.8 u[IU]/mL (ref 0.450–4.500)

## 2019-12-11 LAB — SODIUM, URINE, RANDOM: Sodium, Ur: 28 mmol/L

## 2019-12-11 LAB — VITAMIN D 25 HYDROXY (VIT D DEFICIENCY, FRACTURES): Vit D, 25-Hydroxy: 32.2 ng/mL (ref 30.0–100.0)

## 2019-12-11 LAB — PHOSPHORUS: Phosphorus: 3.7 mg/dL (ref 2.5–4.6)

## 2019-12-11 LAB — CK: Total CK: 72 U/L (ref 38–234)

## 2019-12-11 LAB — GLUCOSE, CAPILLARY
Glucose-Capillary: 155 mg/dL — ABNORMAL HIGH (ref 70–99)
Glucose-Capillary: 100 mg/dL — ABNORMAL HIGH (ref 70–99)
Glucose-Capillary: 240 mg/dL — ABNORMAL HIGH (ref 70–99)
Glucose-Capillary: 233 mg/dL — ABNORMAL HIGH (ref 70–99)

## 2019-12-11 LAB — CREATININE, URINE, RANDOM: Creatinine, Urine: 81 mg/dL

## 2019-12-11 MED ORDER — INSULIN REGULAR HUMAN 100 UNIT/ML IJ SOLN
0.0000 [IU] | INTRAMUSCULAR | Status: DC
  Administered 2019-12-11: 3 [IU] via SUBCUTANEOUS
  Administered 2019-12-12 (×3): 5 [IU] via SUBCUTANEOUS
  Filled 2019-12-11: qty 3

## 2019-12-11 MED ORDER — FERROUS FUMARATE 324 (106 FE) MG PO TABS
1.0000 | ORAL_TABLET | Freq: Every day | ORAL | Status: DC
  Filled 2019-12-11: qty 1

## 2019-12-11 MED ORDER — ASPIRIN EC 81 MG PO TBEC: 81.0000 mg | DELAYED_RELEASE_TABLET | Freq: Every day | ORAL | Status: DC

## 2019-12-11 MED ORDER — HYDROXYZINE PAMOATE 25 MG PO CAPS
25.0000 mg | ORAL_CAPSULE | Freq: Two times a day (BID) | ORAL | Status: DC | PRN
  Filled 2019-12-11: qty 1

## 2019-12-11 MED ORDER — GABAPENTIN 300 MG PO CAPS: 300.0000 mg | ORAL_CAPSULE | Freq: Every day | ORAL | Status: DC

## 2019-12-11 MED ORDER — POTASSIUM CHLORIDE CRYS ER 20 MEQ PO TBCR
40.0000 meq | EXTENDED_RELEASE_TABLET | Freq: Once | ORAL | Status: AC
  Administered 2019-12-11: 40 meq via ORAL
  Filled 2019-12-11: qty 2

## 2019-12-11 MED ORDER — ONDANSETRON HCL 4 MG/2ML IJ SOLN: 4.0000 mg | Freq: Four times a day (QID) | INTRAMUSCULAR | Status: DC | PRN

## 2019-12-11 MED ORDER — INSULIN REGULAR HUMAN (CONC) 500 UNIT/ML ~~LOC~~ SOPN
70.0000 [IU] | PEN_INJECTOR | Freq: Two times a day (BID) | SUBCUTANEOUS | Status: DC
  Filled 2019-12-11: qty 3

## 2019-12-11 MED ORDER — SODIUM CHLORIDE 0.9 % IV BOLUS
500.0000 mL | Freq: Once | INTRAVENOUS | Status: AC
  Administered 2019-12-11: 500 mL via INTRAVENOUS

## 2019-12-11 MED ORDER — SODIUM CHLORIDE 0.9 % IV SOLN
1.0000 g | Freq: Once | INTRAVENOUS | Status: AC
  Administered 2019-12-11: 1 g via INTRAVENOUS
  Filled 2019-12-11: qty 10

## 2019-12-11 MED ORDER — HYDROCODONE-ACETAMINOPHEN 5-325 MG PO TABS: 1.0000 | ORAL_TABLET | ORAL | Status: DC | PRN

## 2019-12-11 MED ORDER — SODIUM CHLORIDE 0.9 % IV SOLN: INTRAVENOUS | Status: AC

## 2019-12-11 MED ORDER — ONDANSETRON HCL 4 MG PO TABS: 4.0000 mg | ORAL_TABLET | Freq: Four times a day (QID) | ORAL | Status: DC | PRN

## 2019-12-11 MED ORDER — FLUOXETINE HCL 20 MG PO CAPS
40.0000 mg | ORAL_CAPSULE | Freq: Every day | ORAL | Status: DC
  Filled 2019-12-11: qty 2

## 2019-12-11 MED ORDER — SIMVASTATIN 40 MG PO TABS
40.0000 mg | ORAL_TABLET | Freq: Every day | ORAL | Status: DC
  Administered 2019-12-11: 40 mg via ORAL
  Filled 2019-12-11 (×2): qty 1

## 2019-12-11 MED ORDER — TRAZODONE HCL 50 MG PO TABS
50.0000 mg | ORAL_TABLET | Freq: Every day | ORAL | Status: DC
  Administered 2019-12-11: 50 mg via ORAL
  Filled 2019-12-11: qty 1

## 2019-12-11 MED ORDER — ENOXAPARIN SODIUM 30 MG/0.3ML ~~LOC~~ SOLN
30.0000 mg | SUBCUTANEOUS | Status: DC
  Administered 2019-12-11: 30 mg via SUBCUTANEOUS
  Filled 2019-12-11: qty 0.3

## 2019-12-11 MED ORDER — ACETAMINOPHEN 650 MG RE SUPP: 650.0000 mg | Freq: Four times a day (QID) | RECTAL | Status: DC | PRN

## 2019-12-11 MED ORDER — ENOXAPARIN SODIUM 40 MG/0.4ML ~~LOC~~ SOLN
30.0000 mg | SUBCUTANEOUS | Status: DC
  Filled 2019-12-11: qty 0.4

## 2019-12-11 MED ORDER — INSULIN ASPART 100 UNIT/ML ~~LOC~~ SOLN: 0.0000 [IU] | SUBCUTANEOUS | Status: DC

## 2019-12-11 MED ORDER — ACETAMINOPHEN 325 MG PO TABS
650.0000 mg | ORAL_TABLET | Freq: Four times a day (QID) | ORAL | Status: DC | PRN
  Administered 2019-12-11: 650 mg via ORAL
  Filled 2019-12-11: qty 2

## 2019-12-11 NOTE — ED Triage Notes (Signed)
Pt in via POV, received call from PCP due to abnormal blood work, reports glucose in 600's.  Pt complaints of intermittent dizziness, states, "Every since I had Covid, it goes up and down."  Pt A/Ox4, NAD noted at this time.

## 2019-12-11 NOTE — ED Notes (Signed)
Dr. Roel Cluck messaged regarding insulin order. Pt CBG 240, allergy listed to novolog. Per Dr. Roel Cluck, pharmacy consult for alternative insulin

## 2019-12-11 NOTE — ED Notes (Addendum)
See triage note, pt states she was sent by PCP for lab work drawn yesterday, states she was told she has hyperglycemia and low sodium. Reports increased urination. Reports she checks her blood sugar regularly and takes insulin at home.  Dr Quentin Cornwall at bedside

## 2019-12-11 NOTE — Progress Notes (Signed)
Good morning.  Left general message HIPAA compliant with Jessica Montoya. If she calls back:  I need her to go into ER this morning.  BS on labs was 610 and sodium 128, I do need her to have further evaluation today, especially with her recent acute issues and now these findings on labs.  Thank you.

## 2019-12-11 NOTE — Telephone Encounter (Signed)
Patient stated she is returning a call to the doctor.  Patient would like a call back at [phone]

## 2019-12-11 NOTE — H&P (Signed)
Jessica Montoya [id number] DOB: [birth year] DOA: 12/11/2019     PCP: [name], NP   Outpatient Specialists:     Endo Dr. [name] Patient arrived to ER on 12/11/19 at 1538  Patient coming from: home Lives  With family    Chief Complaint:  Chief Complaint  Patient presents with  . Hyperglycemia    HPI: Jessica Montoya is a 69 y.o. female with medical history significant of Covid infection in January 15th  2021, DM2, HTN, HLD, CKD, depression, post Covid syndrome vertigo history of colon cancer    Presented with yesterday she was seen by her primary care provider her blood sugar was in 600s. She has been having some headaches as well Patient was instructed to go to emergency department yesterday but presented today.  She has been having some increased urination. She follows with endocrinology regarding her diabetes management which has been difficult to control.] Ever since she had Covid she have had some residual vertigo headaches and not feeling back to her baseline.  She takes Vistaril as needed for this.  Patient states she have had candidal UTI in the recent past and was treated she has still a bit of dysuria   Infectious risk factors:  Reports none      in house  PCR testing  Pending  Lab Results  Component Value Date   SARSCOV2NAA Detected (A) 10/19/2019   SARSCOV2NAA NOT DETECTED 03/23/2019   [place] NOT DETECTED 02/16/2019     Regarding pertinent Chronic problems:     Hyperlipidemia -  on statins Zocor   HTN on ramipril    DM 2 -  Lab Results  Component Value Date   HGBA1C 11.7 (H) 12/10/2019   on insulin    CKD stage III - baseline Cr  1.2 Lab Results  Component Value Date   CREATININE 1.66 (H) 12/11/2019   CREATININE 1.07 (H) 12/10/2019   CREATININE 1.29 (H) 07/30/2019    While in ER: Appeared hypotensive and dehydrated in emergency department blood sugar now down to 150s.  She was given gentle IV fluids no evidence of  DKA But her creatinine up from baseline Given dysuria given 1 dose of rocephin in ER  Hospitalist was called for admission for dehydration and AKI hypokalemia  The following Work up has been ordered so far:  Orders Placed This Encounter  Procedures  . Urine culture  . DG Chest Portable 1 View  . Basic metabolic panel  . CBC  . Urinalysis, Complete w Microscopic  . Glucose, capillary  . Lactic acid, plasma  . Glucose, capillary  . Saline Lock IV, Maintain IV access  . In and Out Cath  . Offer food (encourage patient to eat)  . Encourage fluids  . Consult to hospitalist  ALL PATIENTS BEING ADMITTED/HAVING PROCEDURES NEED COVID-19 SCREENING  . CBG monitoring, ED    Following Medications were ordered in ER: Medications  cefTRIAXone (ROCEPHIN) 1 g in sodium chloride 0.9 % 100 mL IVPB (1 g Intravenous New Bag/Given 12/11/19 1929)  sodium chloride 0.9 % bolus 500 mL (0 mLs Intravenous Stopped 12/11/19 1758)  sodium chloride 0.9 % bolus 500 mL (500 mLs Intravenous New Bag/Given 12/11/19 1832)  potassium chloride SA (KLOR-CON) CR tablet 40 mEq (40 mEq Oral Given 12/11/19 1847)        Consult Orders  (From admission, onward)         Start     Ordered   12/11/19 1943  Consult  to hospitalist  ALL PATIENTS BEING ADMITTED/HAVING PROCEDURES NEED COVID-19 SCREENING  Once    Comments: ALL PATIENTS BEING ADMITTED/HAVING PROCEDURES NEED COVID-19 SCREENING  Provider:  (Not yet assigned)  Question Answer Comment  Place call to: [phone]   Reason for Consult Admit      12/11/19 1942           Significant initial  Findings: Abnormal Labs Reviewed  BASIC METABOLIC PANEL - Abnormal; Notable for the following components:      Result Value   Potassium 3.2 (*)    Chloride 97 (*)    Glucose, Bld 139 (*)    Creatinine, Ser 1.66 (*)    GFR calc non Af Amer 31 (*)    GFR calc Af Amer 36 (*)    All other components within normal limits  CBC - Abnormal; Notable for the following components:    WBC 11.3 (*)    All other components within normal limits  URINALYSIS, COMPLETE (UACMP) WITH MICROSCOPIC - Abnormal; Notable for the following components:   Color, Urine YELLOW (*)    APPearance HAZY (*)    Glucose, UA >=500 (*)    Hgb urine dipstick SMALL (*)    Protein, ur 30 (*)    Leukocytes,Ua LARGE (*)    WBC, UA >50 (*)    Bacteria, UA RARE (*)    All other components within normal limits  GLUCOSE, CAPILLARY - Abnormal; Notable for the following components:   Glucose-Capillary 155 (*)    All other components within normal limits  GLUCOSE, CAPILLARY - Abnormal; Notable for the following components:   Glucose-Capillary 100 (*)    All other components within normal limits    Otherwise labs showing:    Recent Labs  Lab 12/10/19 1652 12/11/19 1647  NA 128* 136  K 5.0 3.2*  CO2 24 24  GLUCOSE 610* 139*  BUN 19 23  CREATININE 1.07* 1.66*  CALCIUM 10.1 9.7    Cr  Up from baseline see below Lab Results  Component Value Date   CREATININE 1.66 (H) 12/11/2019   CREATININE 1.07 (H) 12/10/2019   CREATININE 1.29 (H) 07/30/2019    Recent Labs  Lab 12/10/19 1652  AST 20  ALT 36*  ALKPHOS 71  BILITOT 0.8  PROT 7.1  ALBUMIN 4.5   Lab Results  Component Value Date   CALCIUM 9.7 12/11/2019     WBC      Component Value Date/Time   WBC 11.3 (H) 12/11/2019 1647   ANC    Component Value Date/Time   NEUTROABS 3.8 03/01/2018 1519   NEUTROABS 4.1 11/22/2013 1126   ALC No components found for: LYMPHAB    Plt: Lab Results  Component Value Date   PLT 209 12/11/2019     Lactic Acid, Venous    Component Value Date/Time   LATICACIDVEN 1.8 12/11/2019 1924      COVID-19 Labs  No results for input(s): DDIMER, FERRITIN, LDH, CRP in the last 72 hours.  Lab Results  Component Value Date   SARSCOV2NAA Detected (A) 10/19/2019   SARSCOV2NAA NOT DETECTED 03/23/2019   SARSCOV2NAA NOT DETECTED 02/16/2019     HG/HCT  Stable,     Component Value Date/Time    HGB 13.2 12/11/2019 1647   HGB 13.0 03/01/2018 1519   HCT 39.5 12/11/2019 1647   HCT 39.8 03/01/2018 1519     ECG: not Ordered     DM  labs:  HbA1C: Recent Labs    04/17/19 1337 07/30/19  1343 12/10/19 1509  HGBA1C 7.3* 11.5* 11.7*       CBG (last 3)  Recent Labs    12/11/19 1626 12/11/19 1945  GLUCAP 155* 100*       UA pyourea   Urine analysis:    Component Value Date/Time   COLORURINE YELLOW (A) 12/11/2019 1853   APPEARANCEUR HAZY (A) 12/11/2019 1853   APPEARANCEUR Hazy 10/20/2012 1642   LABSPEC 1.020 12/11/2019 1853   LABSPEC 1.015 10/20/2012 1642   PHURINE 5.0 12/11/2019 1853   GLUCOSEU >=500 (A) 12/11/2019 1853   GLUCOSEU >=500 10/20/2012 1642   HGBUR SMALL (A) 12/11/2019 1853   BILIRUBINUR NEGATIVE 12/11/2019 1853   BILIRUBINUR Negative 10/20/2012 1642   KETONESUR NEGATIVE 12/11/2019 1853   PROTEINUR 30 (A) 12/11/2019 1853   NITRITE NEGATIVE 12/11/2019 1853   LEUKOCYTESUR LARGE (A) 12/11/2019 1853   LEUKOCYTESUR 3+ 10/20/2012 1642   Ordered    CXR - NON acute     ED Triage Vitals  Enc Vitals Group     BP 12/11/19 1624 (!) 84/45     Pulse Rate 12/11/19 1624 65     Resp 12/11/19 1624 16     Temp 12/11/19 1624 98.1 F (36.7 C)     Temp Source 12/11/19 1624 Oral     SpO2 12/11/19 1624 98 %     Weight 12/11/19 1610 150 lb (68 kg)     Height 12/11/19 1610 5\' 6"  (1.676 m)     Head Circumference --      Peak Flow --      Pain Score 12/11/19 1610 0     Pain Loc --      Pain Edu? --      Excl. in [place]? --   TMAX(24)@       Latest  Blood pressure (!) 148/67, pulse 74, temperature 98.1 F (36.7 C), temperature source Oral, resp. rate 16, height 5\' 6"  (1.676 m), weight 68 kg, SpO2 100 %.    Review of Systems:    Pertinent positives include:  fatigue,  Constitutional:  No weight loss, night sweats, Fevers, chills weight loss  HEENT:  No headaches, Difficulty swallowing,Tooth/dental problems,Sore throat,  No sneezing, itching, ear ache, nasal  congestion, post nasal drip,  Cardio-vascular:  No chest pain, Orthopnea, PND, anasarca, dizziness, palpitations.no Bilateral lower extremity swelling  GI:  No heartburn, indigestion, abdominal pain, nausea, vomiting, diarrhea, change in bowel habits, loss of appetite, melena, blood in stool, hematemesis Resp:  no shortness of breath at rest. No dyspnea on exertion, No excess mucus, no productive cough, No non-productive cough, No coughing up of blood.No change in color of mucus.No wheezing. Skin:  no rash or lesions. No jaundice GU:  no dysuria, change in color of urine, no urgency or frequency. No straining to urinate.  No flank pain.  Musculoskeletal:  No joint pain or no joint swelling. No decreased range of motion. No back pain.  Psych:  No change in mood or affect. No depression or anxiety. No memory loss.  Neuro: no localizing neurological complaints, no tingling, no weakness, no double vision, no gait abnormality, no slurred speech, no confusion  All systems reviewed and apart from [place] all are negative  Past Medical History:   Past Medical History:  Diagnosis Date  . Anxiety   . Arthritis    knees  . Chronic pain   . CKD (chronic kidney disease), stage II   . Depression   . Diabetes mellitus without complication ([place])   .  Diabetic neuropathy ([place])   . Fatigue    a. 05/2018 Echo: EF 60-65%, no rwma, nl RV fxn; b. 05/2018 Lexiscan MV: EF>65%. No ischemia/infarct.  . Hyperlipidemia   . Hypertension   . Insomnia   . Long term current use of insulin ([place])   . Peripheral neuropathy   . Vertigo    2x/month  . Wears dentures    full upper and lower      Past Surgical History:  Procedure Laterality Date  . ABDOMINAL HYSTERECTOMY    . CATARACT EXTRACTION W/PHACO Left 02/20/2019   Procedure: CATARACT EXTRACTION PHACO AND INTRAOCULAR LENS PLACEMENT ([place]) LEFT DIABETES;  Surgeon: Birder Robson, MD;  Location: [facility];  Service: Ophthalmology;  Laterality:  Left;  . CATARACT EXTRACTION W/PHACO Right 03/27/2019   Procedure: CATARACT EXTRACTION PHACO AND INTRAOCULAR LENS PLACEMENT ([place])  RIGHT DIABETIC;  Surgeon: Birder Robson, MD;  Location: [facility];  Service: Ophthalmology;  Laterality: Right;  Diabetic - insulin  . COLON SURGERY      Social History:  Ambulatory  Kasandra Knudsen,     reports that she has never smoked. She has never used smokeless tobacco. She reports that she does not drink alcohol or use drugs.    Family History:   Family History  Problem Relation Age of Onset  . Cancer Mother        melanoma  . Breast cancer Mother 75  . Cancer Father        pancreatic  . Hypertension Maternal Grandmother   . Cancer Maternal Grandmother        colon  . Cancer Paternal Grandmother        stomach  . Stroke Paternal Grandfather   . Cancer Maternal Grandfather        kidney    Allergies: Allergies  Allergen Reactions  . Compazine [Prochlorperazine Edisylate] Rash  . Exenatide Nausea Only  . Insulin Aspart Nausea Only     Prior to Admission medications   Medication Sig Start Date End Date Taking? Authorizing Provider  aspirin 81 MG tablet Take 81 mg by mouth daily.    [provider]  Cholecalciferol (VITAMIN D PO) Take 2,000 Units by mouth daily.     [provider]  ferrous fumarate (HEMOCYTE - 106 MG FE) 325 (106 FE) MG TABS tablet Take 1 tablet by mouth daily.    [provider]  FLUoxetine (PROZAC) 40 MG capsule Take 1 capsule (40 mg total) by mouth daily. 12/10/19   [name], Henrine Screws T, NP  fluticasone (FLONASE) 50 MCG/ACT nasal spray Place 2 sprays into both nostrils daily. Patient not taking: Reported on 10/30/2019 10/19/19   Carnella Guadalajara I, NP  gabapentin (NEURONTIN) 300 MG capsule Take 1 capsule (300 mg total) by mouth 2 (two) times daily. 07/30/19   [name], Henrine Screws T, NP  HUMULIN R U-500 KWIKPEN 500 UNIT/ML kwikpen INJECT 65 UNITS 3 TIMES DAILY AT 8AM, 2PM AND 8 PM 02/19/19   [provider]  hydrOXYzine (VISTARIL) 25 MG capsule Take 1 capsule (25 mg total) by mouth 2 (two) times daily as needed (dizziness). 12/10/19   [name] T, NP  Multiple Vitamin (MULTIVITAMIN) tablet Take 1 tablet by mouth daily.    [provider]  omeprazole (PRILOSEC) 20 MG capsule Take 1 capsule (20 mg total) by mouth daily. 07/30/19   [name], Henrine Screws T, NP  ramipril (ALTACE) 2.5 MG capsule Take 1 capsule (2.5 mg total) by mouth daily. 07/30/19   [name], [name],  NP  simvastatin (ZOCOR) 40 MG tablet Take 1 tablet (40 mg total) by mouth daily. 07/30/19   [name], Henrine Screws T, NP  traZODone (DESYREL) 50 MG tablet Take 1 tablet (50 mg total) by mouth at bedtime. 12/10/19   [name], Henrine Screws T, NP  vitamin B-12 (CYANOCOBALAMIN) 1000 MCG tablet Take 2,000 mcg by mouth daily.    [provider]   Physical Exam: Blood pressure (!) 148/67, pulse 74, temperature 98.1 F (36.7 C), temperature source Oral, resp. rate 16, height 5\' 6"  (1.676 m), weight 68 kg, SpO2 100 %. 1. General:  in No  Acute distress   Chronically ill  -appearing 2. Psychological: Alert and   Oriented 3. Head/ENT:    Dry Mucous Membranes                          Head Non traumatic, neck supple                            Poor Dentition 4. SKIN:  decreased Skin turgor,  Skin clean Dry and intact no rash 5. Heart: Regular rate and rhythm no  Murmur, no Rub or gallop 6. Lungs:  no wheezes or crackles   7. Abdomen: Soft,   non-tender, Non distended   obese  bowel sounds present 8. Lower extremities: no clubbing, cyanosis,  edema 9. Neurologically Grossly intact, moving all 4 extremities equally  10. MSK: Normal range of motion   All other LABS:     Recent Labs  Lab 12/11/19 1647  WBC 11.3*  HGB 13.2  HCT 39.5  MCV 90.4  PLT 209     Recent Labs  Lab 12/10/19 1652 12/11/19 1647  NA 128* 136  K 5.0 3.2*  CL 89* 97*  CO2 24 24  GLUCOSE 610* 139*  BUN 19 23  CREATININE 1.07* 1.66*  CALCIUM 10.1  9.7     Recent Labs  Lab 12/10/19 1652  AST 20  ALT 36*  ALKPHOS 71  BILITOT 0.8  PROT 7.1  ALBUMIN 4.5       Cultures: No results found for: SDES, SPECREQUEST, CULT, REPTSTATUS   Radiological Exams on Admission: DG Chest Portable 1 View  Result Date: 12/11/2019 CLINICAL DATA:  Weakness EXAM: PORTABLE CHEST 1 VIEW COMPARISON:  11/29/2012 FINDINGS: Cardiac shadows within normal limits. The lungs are well aerated bilaterally. No focal infiltrate is seen. Minimal platelike atelectasis in the left base is noted. No bony abnormality is seen. IMPRESSION: Minimal left basilar atelectasis. Electronically Signed   By: Inez Catalina M.D.   On: 12/11/2019 19:01    Chart has been reviewed   Assessment/Plan  69 y.o. female with medical history significant of Covid infection in January 2021, DM2, HTN, HLD, CKD, depression, post Covid syndrome vertigo history of colon cancer  Admitted for AKI, dehydration  Present on Admission: . Dehydration -we will rehydrate and follow may need orthostatics prior to discharge   AKI - will rehydrate check urine electrolytes  . Depression -chronic continue home medication   . Hypertension associated with diabetes ([place]) -hold lisinopril given AKI   . Hyperlipidemia associated with type 2 diabetes mellitus ([place]) Continue home meds  . Diabetic neuropathy ([place]) Continue home meds . GERD (gastroesophageal reflux disease) Continue home meds . Uncontrolled type 2 diabetes mellitus with chronic kidney disease ([place])-  - Order Sensitive  SSI   -  Change to HUmulin r 500 - 70  units bid while worse renal function,  -  check TSH and HgA1C  - Hold by mouth medications    . Chronic kidney disease, stage 3 chronic avoid nephrotoxic medications monitor kidney status   . Type 2 diabetes mellitus with microalbuminuria ([facility]) -hold lisinopril   . History of COVID-19 -patient with minimal symptoms but recovering.  Keep on precautions for now Will not retest   .  Hypokalemia - - will replace and repeat in AM,  check magnesium level and replace as needed  . Pyuria-1 dose of Rocephin given in emergency department await results of urine culture and treat if needed   Other plan as per orders.  DVT prophylaxis:  Lovenox     Code Status:  FULL CODE as per patient  I had personally discussed CODE STATUS with patient    Family Communication:   Family not at  Bedside    Disposition Plan:     To home once workup is complete and patient is stable   Following needs for discharge:                            Electrolytes corrected                              EXPECT DC tomorrow                    Would benefit from PT/OT eval prior to DC  Ordered                                      Consults called: none  Admission status:  ED Disposition    None      Obs     Level of care    tele  For 12H    Precautions: admitted as  covid positive but in remission last tested positive 10/19/2019 No active isolations    PPE: Used by the provider:   P100  eye Goggles,  Gloves     Kerby Borner 12/11/2019, 8:42 PM    [facility]     after 2 AM please page floor coverage PA If 7AM-7PM, please contact the day team taking care of the patient using [email or web address]   Patient was evaluated in the context of the global COVID-19 pandemic, which necessitated consideration that the patient might be at risk for infection with the SARS-CoV-2 virus that causes COVID-19. Institutional protocols and algorithms that pertain to the evaluation of patients at risk for COVID-19 are in a state of rapid change based on information released by regulatory bodies including the CDC and federal and state organizations. These policies and algorithms were followed during the patient's care.

## 2019-12-11 NOTE — ED Notes (Signed)
Husband updated regarding admission

## 2019-12-11 NOTE — ED Notes (Signed)
Pt assisted to restroom ambulatory with cane. Unable provide urine sample at this time.

## 2019-12-11 NOTE — ED Provider Notes (Signed)
[facility] Emergency Department Provider Note    First MD Initiated Contact with Patient 12/11/19 1631     (approximate)  I have reviewed the triage vital signs and the nursing notes.   HISTORY  Chief Complaint Hyperglycemia    HPI Jessica Montoya is a 69 y.o. female presents to the ER recommendation PCP for evaluation of hyperglycemia.  Patient states that she been having difficulty controlling her blood sugar since she was diagnosed with Covid.  She denies any chest pain or shortness of breath.  No nausea or vomiting.  States she does feel dehydrated has had increased urinary frequency.  No dysuria.  No abdominal pain or nausea.  No back pain.    Past Medical History:  Diagnosis Date  . Anxiety   . Arthritis    knees  . Chronic pain   . CKD (chronic kidney disease), stage II   . Depression   . Diabetes mellitus without complication ([place])   . Diabetic neuropathy ([place])   . Fatigue    a. 05/2018 Echo: EF 60-65%, no rwma, nl RV fxn; b. 05/2018 Lexiscan MV: EF>65%. No ischemia/infarct.  . Hyperlipidemia   . Hypertension   . Insomnia   . Long term current use of insulin ([place])   . Peripheral neuropathy   . Vertigo    2x/month  . Wears dentures    full upper and lower   Family History  Problem Relation Age of Onset  . Cancer Mother        melanoma  . Breast cancer Mother 20  . Cancer Father        pancreatic  . Hypertension Maternal Grandmother   . Cancer Maternal Grandmother        colon  . Cancer Paternal Grandmother        stomach  . Stroke Paternal Grandfather   . Cancer Maternal Grandfather        kidney   Past Surgical History:  Procedure Laterality Date  . ABDOMINAL HYSTERECTOMY    . CATARACT EXTRACTION W/PHACO Left 02/20/2019   Procedure: CATARACT EXTRACTION PHACO AND INTRAOCULAR LENS PLACEMENT ([place]) LEFT DIABETES;  Surgeon: Birder Robson, MD;  Location: [place];  Service: Ophthalmology;  Laterality: Left;  .  CATARACT EXTRACTION W/PHACO Right 03/27/2019   Procedure: CATARACT EXTRACTION PHACO AND INTRAOCULAR LENS PLACEMENT ([place])  RIGHT DIABETIC;  Surgeon: Birder Robson, MD;  Location: [place];  Service: Ophthalmology;  Laterality: Right;  Diabetic - insulin  . COLON SURGERY     Patient Active Problem List   Diagnosis Date Noted  . Vitamin D deficiency 12/10/2019  . Vitamin B12 deficiency 12/10/2019  . Dizziness 12/10/2019  . History of colon cancer 12/10/2019  . History of COVID-19 10/19/2019  . Chronic bilateral low back pain with bilateral sciatica 04/17/2019  . Background retinopathy 12/04/2018  . Osteoarthritis 11/09/2017  . Anemia 09/28/2017  . Chronic kidney disease, stage 3 05/02/2017  . Advanced care planning/counseling discussion 11/24/2016  . Insomnia 07/02/2015  . Depression 05/21/2015  . Hypertension associated with diabetes ([place]) 05/21/2015  . Diabetic neuropathy ([place]) 05/21/2015  . Hyperlipidemia associated with type 2 diabetes mellitus ([place]) 05/21/2015  . Chronic pain syndrome 05/21/2015  . GERD (gastroesophageal reflux disease) 05/21/2015  . Uncontrolled type 2 diabetes mellitus with chronic kidney disease ([place]) 05/21/2015  . Long-term insulin use ([place]) 08/16/2014  . Type 2 diabetes mellitus with microalbuminuria ([place]) 08/16/2014      Prior to Admission medications   Medication  Sig Start Date End Date Taking? Authorizing Provider  aspirin 81 MG tablet Take 81 mg by mouth daily.    [provider]  Cholecalciferol (VITAMIN D PO) Take 2,000 Units by mouth daily.     [provider]  ferrous fumarate (HEMOCYTE - 106 MG FE) 325 (106 FE) MG TABS tablet Take 1 tablet by mouth daily.    [provider]  FLUoxetine (PROZAC) 40 MG capsule Take 1 capsule (40 mg total) by mouth daily. 12/10/19   [name], Henrine Screws T, NP  fluticasone (FLONASE) 50 MCG/ACT nasal spray Place 2 sprays into both nostrils daily. Patient not taking: Reported on  10/30/2019 10/19/19   Carnella Gu[place]ajara I, NP  gabapentin (NEURONTIN) 300 MG capsule Take 1 capsule (300 mg total) by mouth 2 (two) times daily. 07/30/19   [name], Henrine Screws T, NP  HUMULIN R U-500 KWIKPEN 500 UNIT/ML kwikpen INJECT 65 UNITS 3 TIMES DAILY AT 8AM, 2PM AND 8 PM 02/19/19   [provider]  hydrOXYzine (VISTARIL) 25 MG capsule Take 1 capsule (25 mg total) by mouth 2 (two) times daily as needed (dizziness). 12/10/19   [name] T, NP  Multiple Vitamin (MULTIVITAMIN) tablet Take 1 tablet by mouth daily.    [provider]  omeprazole (PRILOSEC) 20 MG capsule Take 1 capsule (20 mg total) by mouth daily. 07/30/19   [name], Henrine Screws T, NP  ramipril (ALTACE) 2.5 MG capsule Take 1 capsule (2.5 mg total) by mouth daily. 07/30/19   [name], Henrine Screws T, NP  simvastatin (ZOCOR) 40 MG tablet Take 1 tablet (40 mg total) by mouth daily. 07/30/19   [name], Henrine Screws T, NP  traZODone (DESYREL) 50 MG tablet Take 1 tablet (50 mg total) by mouth at bedtime. 12/10/19   [name], Henrine Screws T, NP  vitamin B-12 (CYANOCOBALAMIN) 1000 MCG tablet Take 2,000 mcg by mouth daily.    [provider]    Allergies Compazine [prochlorperazine edisylate], Exenatide, and Insulin aspart    Social History Social History   Tobacco Use  . Smoking status: Never Smoker  . Smokeless tobacco: Never Used  Substance Use Topics  . Alcohol use: No    Alcohol/week: 0.0 standard drinks  . Drug use: No    Review of Systems Patient denies headaches, rhinorrhea, blurry vision, numbness, shortness of breath, chest pain, edema, cough, abdominal pain, nausea, vomiting, diarrhea, dysuria, fevers, rashes or hallucinations unless otherwise stated above in HPI. ____________________________________________   PHYSICAL EXAM:  VITAL SIGNS: Vitals:   12/11/19 1921 12/11/19 1930  BP: (!) 143/70 (!) 148/67  Pulse: 74 74  Resp:    Temp:    SpO2: 100% 100%    Constitutional: Alert and oriented.  Eyes:  Conjunctivae are normal.  Head: Atraumatic. Nose: No congestion/rhinnorhea. Mouth/Throat: Mucous membranes are dry   Neck: No stridor. Painless ROM.  Cardiovascular: Normal rate, regular rhythm. Grossly normal heart sounds.  Good peripheral circulation. Respiratory: Normal respiratory effort.  No retractions. Lungs CTAB. Gastrointestinal: Soft and nontender. No distention. No abdominal bruits. No CVA tenderness. Genitourinary:  Musculoskeletal: No lower extremity tenderness nor edema.  No joint effusions. Neurologic:  Normal speech and language. No gross focal neurologic deficits are appreciated. No facial droop Skin:  Skin is warm, dry and intact. No rash noted. Psychiatric: Mood and affect are normal. Speech and behavior are normal.  ____________________________________________   LABS (all labs ordered are listed, but only abnormal results are displayed)  Results for orders placed or performed during the hospital encounter of 12/11/19 (from the past  24 hour(s))  Glucose, capillary     Status: Abnormal   Collection Time: 12/11/19  4:26 PM  Result Value Ref Range   Glucose-Capillary 155 (H) 70 - 99 mg/dL  Basic metabolic panel     Status: Abnormal   Collection Time: 12/11/19  4:47 PM  Result Value Ref Range   Sodium 136 135 - 145 mmol/L   Potassium 3.2 (L) 3.5 - 5.1 mmol/L   Chloride 97 (L) 98 - 111 mmol/L   CO2 24 22 - 32 mmol/L   Glucose, Bld 139 (H) 70 - 99 mg/dL   BUN 23 8 - 23 mg/dL   Creatinine, Ser 1.66 (H) 0.44 - 1.00 mg/dL   Calcium 9.7 8.9 - 10.3 mg/dL   GFR calc non Af Amer 31 (L) >60 mL/min   GFR calc Af Amer 36 (L) >60 mL/min   Anion gap 15 5 - 15  CBC     Status: Abnormal   Collection Time: 12/11/19  4:47 PM  Result Value Ref Range   WBC 11.3 (H) 4.0 - 10.5 K/uL   RBC 4.37 3.87 - 5.11 MIL/uL   Hemoglobin 13.2 12.0 - 15.0 g/dL   HCT 39.5 36.0 - 46.0 %   MCV 90.4 80.0 - 100.0 fL   MCH 30.2 26.0 - 34.0 pg   MCHC 33.4 30.0 - 36.0 g/dL   RDW 12.9 11.5 - 15.5 %    Platelets 209 150 - 400 K/uL   nRBC 0.0 0.0 - 0.2 %  Urinalysis, Complete w Microscopic     Status: Abnormal   Collection Time: 12/11/19  6:53 PM  Result Value Ref Range   Color, Urine YELLOW (A) YELLOW   APPearance HAZY (A) CLEAR   Specific Gravity, Urine 1.020 1.005 - 1.030   pH 5.0 5.0 - 8.0   Glucose, UA >=500 (A) NEGATIVE mg/dL   Hgb urine dipstick SMALL (A) NEGATIVE   Bilirubin Urine NEGATIVE NEGATIVE   Ketones, ur NEGATIVE NEGATIVE mg/dL   Protein, ur 30 (A) NEGATIVE mg/dL   Nitrite NEGATIVE NEGATIVE   Leukocytes,Ua LARGE (A) NEGATIVE   RBC / HPF 11-20 0 - 5 RBC/hpf   WBC, UA >50 (H) 0 - 5 WBC/hpf   Bacteria, UA RARE (A) NONE SEEN   Squamous Epithelial / LPF 6-10 0 - 5   Mucus PRESENT    Hyaline Casts, UA PRESENT   Lactic acid, plasma     Status: None   Collection Time: 12/11/19  7:24 PM  Result Value Ref Range   Lactic Acid, Venous 1.8 0.5 - 1.9 mmol/L  Glucose, capillary     Status: Abnormal   Collection Time: 12/11/19  7:45 PM  Result Value Ref Range   Glucose-Capillary 100 (H) 70 - 99 mg/dL   ____________________________________________   ____________________________________________  RADIOLOGY   ____________________________________________   PROCEDURES  Procedure(s) performed:  Procedures    Critical Care performed: no ____________________________________________   INITIAL IMPRESSION / ASSESSMENT AND PLAN / ED COURSE  Pertinent labs & imaging results that were available during my care of the patient were reviewed by me and considered in my medical decision making (see chart for details).   DDX: dm, hhns, dka, dehydration, electrolyte abn, sepsis  Taziya Robin is a 69 y.o. who presents to the ED with symptoms as described above.  Patient frail appearing comes in hypotensive and dehydrated appearing.  Has been having increasing urinary frequency and dysuria.  Was checked yesterday with blood glucose in the 600s.  Repeat  glucose here is  150.  Afebrile but concerning for possible sepsis.  Will give IV fluids sent a blood work for above differential.  Clinical Course as of Dec 10 1953  Tue Dec 11, 2019  1827 No acidosis.  Glucose 140.  We will continue with IV fluids.   [PR]  1942 Patient blood pressure is improving but given her urinalysis being concerning for acute cystitis with mild leukocytosis and hypotension upon arrival given her age and poorly controlled diabetes I do believe that further medical management hospital clinically indicated for IV fluids and blood pressure monitoring.     [PR]    Clinical Course User Index [PR] Merlyn Lot, MD    The patient was evaluated in Emergency Department today for the symptoms described in the history of present illness. He/she was evaluated in the context of the global COVID-19 pandemic, which necessitated consideration that the patient might be at risk for infection with the SARS-CoV-2 virus that causes COVID-19. Institutional protocols and algorithms that pertain to the evaluation of patients at risk for COVID-19 are in a state of rapid change based on information released by regulatory bodies including the CDC and federal and state organizations. These policies and algorithms were followed during the patient's care in the ED.  As part of my medical decision making, I reviewed the following data within the [place] notes reviewed and incorporated, Labs reviewed, notes from prior ED visits and [place] Controlled Substance Database   ____________________________________________   FINAL CLINICAL IMPRESSION(S) / ED DIAGNOSES  Final diagnoses:  Hypotension, unspecified hypotension type  Dysuria      NEW MEDICATIONS STARTED DURING THIS VISIT:  New Prescriptions   No medications on file     Note:  This document was prepared using Dragon voice recognition software and may include unintentional dictation errors.    Merlyn Lot,  MD 12/11/19 [place]

## 2019-12-11 NOTE — Chronic Care Management (AMB) (Signed)
Chronic Care Management   Follow Up Note   12/11/2019 Name: Jessica Montoya MRN: [id number] DOB: 1950-10-28  Referred by: [name], NP Reason for referral : Chronic Care Management (Medication Management)   Jessica Montoya is a 69 y.o. year old female who is a primary care patient of [name], Jessica Faster, NP. The CCM team was consulted for assistance with chronic disease management and care coordination needs.   Care coordination completed today.   Review of patient status, including review of consultants reports, relevant laboratory and other test results, and collaboration with appropriate care team members and the patient's provider was performed as part of comprehensive patient evaluation and provision of chronic care management services.    SDOH (Social Determinants of Health) assessments performed: No See Care Plan activities for detailed interventions related to SDOH)  SDOH Interventions     Most Recent Value  SDOH Interventions  SDOH Interventions for the Following Domains  Financial Strain  Financial Strain Interventions  Other (Comment) [patient assistance application]       Outpatient Encounter Medications as of 12/11/2019  Medication Sig Note  . aspirin 81 MG tablet Take 81 mg by mouth daily.   . Cholecalciferol (VITAMIN D PO) Take 2,000 Units by mouth daily.    . ferrous fumarate (HEMOCYTE - 106 MG FE) 325 (106 FE) MG TABS tablet Take 1 tablet by mouth daily.   [name] FLUoxetine (PROZAC) 40 MG capsule Take 1 capsule (40 mg total) by mouth daily.   . fluticasone (FLONASE) 50 MCG/ACT nasal spray Place 2 sprays into both nostrils daily. (Patient not taking: Reported on 10/30/2019)   . gabapentin (NEURONTIN) 300 MG capsule Take 1 capsule (300 mg total) by mouth 2 (two) times daily.   [name] HUMULIN R U-500 KWIKPEN 500 UNIT/ML kwikpen INJECT 65 UNITS 3 TIMES DAILY AT 8AM, 2PM AND 8 PM 10/19/2019: 70 units TID  . hydrOXYzine (VISTARIL) 25 MG capsule Take 1 capsule (25 mg  total) by mouth 2 (two) times daily as needed (dizziness).   . Multiple Vitamin (MULTIVITAMIN) tablet Take 1 tablet by mouth daily.   [name] omeprazole (PRILOSEC) 20 MG capsule Take 1 capsule (20 mg total) by mouth daily.   . ramipril (ALTACE) 2.5 MG capsule Take 1 capsule (2.5 mg total) by mouth daily.   . simvastatin (ZOCOR) 40 MG tablet Take 1 tablet (40 mg total) by mouth daily.   . traZODone (DESYREL) 50 MG tablet Take 1 tablet (50 mg total) by mouth at bedtime.   . vitamin B-12 (CYANOCOBALAMIN) 1000 MCG tablet Take 2,000 mcg by mouth daily.    No facility-administered encounter medications on file as of 12/11/2019.     Objective:   Goals Addressed            This Visit's Progress     Patient Stated   . PharmD "I can't afford my insulin" (pt-stated)       Current Barriers:  . Diabetes: uncontrolled; most recent A1c 11.5% . Current antihyperglycemic regimen: Humulin R U500 65 units TID o Previously approved for Humulin R U500 assistance through [employer], needs to reapply for 2021 . Cardiovascular risk reduction: o Current hypertensive regimen: ramipril 2.5 mg daily o Current hyperlipidemia regimen: simvastatin 40 mg daily  Pharmacist Clinical Goal(s):  [name] Over the next 90 days, patient with work with PharmD and primary care provider to address optimized glycemic regimen  Interventions: . Received provider portion of application from Dr. [name]. Faxed completed application to [employer].  Will follow up with the company in 1-2 weeks  Patient Self Care Activities:  . Patient will check blood glucose BID , document, and provide at future appointments . Patient will take medications as prescribed . Patient will report any questions or concerns to provider   Please see past updates related to this goal by clicking on the "Past Updates" button in the selected goal          Plan:  - Will outreach as previously scheduled  [name], PharmD, [place] [phone]

## 2019-12-11 NOTE — ED Notes (Signed)
Pt assisted to restroom, able to provide urine sample at this time.  Pt given cup of ice water and graham crackers

## 2019-12-11 NOTE — ED Notes (Signed)
Pt given sandwich tray and ice water 

## 2019-12-11 NOTE — Telephone Encounter (Signed)
Called and spoke to patient. Jessica Montoya wanted to know if and when she was going to the ER. Patient states she is going this afternoon, states that is the best she could do.

## 2019-12-11 NOTE — Patient Instructions (Addendum)
Visit Information  Goals Addressed            This Visit's Progress     Patient Stated   . PharmD "I can't afford my insulin" (pt-stated)       Current Barriers:  . Diabetes: uncontrolled; most recent A1c 11.5% . Current antihyperglycemic regimen: Humulin R U500 65 units TID o Previously approved for Humulin R U500 assistance through [employer], needs to reapply for 2021 . Cardiovascular risk reduction: o Current hypertensive regimen: ramipril 2.5 mg daily o Current hyperlipidemia regimen: simvastatin 40 mg daily  Pharmacist Clinical Goal(s):  [name] Over the next 90 days, patient with work with PharmD and primary care provider to address optimized glycemic regimen  Interventions: . Received provider portion of application from Dr. [name]. Faxed completed application to [employer]. Will follow up with the company in 1-2 weeks  Patient Self Care Activities:  . Patient will check blood glucose BID , document, and provide at future appointments . Patient will take medications as prescribed . Patient will report any questions or concerns to provider   Please see past updates related to this goal by clicking on the "Past Updates" button in the selected goal         Patient verbalizes understanding of instructions provided today.   Plan:  - Will outreach as previously scheduled  [name], PharmD, [place]  [phone]

## 2019-12-12 DIAGNOSIS — E860 Dehydration: Secondary | ICD-10-CM | POA: Diagnosis not present

## 2019-12-12 DIAGNOSIS — E1165 Type 2 diabetes mellitus with hyperglycemia: Secondary | ICD-10-CM

## 2019-12-12 DIAGNOSIS — N183 Chronic kidney disease, stage 3 unspecified: Secondary | ICD-10-CM

## 2019-12-12 DIAGNOSIS — K219 Gastro-esophageal reflux disease without esophagitis: Secondary | ICD-10-CM

## 2019-12-12 DIAGNOSIS — IMO0002 Reserved for concepts with insufficient information to code with codable children: Secondary | ICD-10-CM

## 2019-12-12 DIAGNOSIS — E1140 Type 2 diabetes mellitus with diabetic neuropathy, unspecified: Secondary | ICD-10-CM

## 2019-12-12 LAB — URINE CULTURE

## 2019-12-12 LAB — GLUCOSE, CAPILLARY
Glucose-Capillary: 258 mg/dL — ABNORMAL HIGH (ref 70–99)
Glucose-Capillary: 259 mg/dL — ABNORMAL HIGH (ref 70–99)
Glucose-Capillary: 276 mg/dL — ABNORMAL HIGH (ref 70–99)

## 2019-12-12 LAB — CBC
HCT: 32.3 % — ABNORMAL LOW (ref 36.0–46.0)
Hemoglobin: 11.2 g/dL — ABNORMAL LOW (ref 12.0–15.0)
MCH: 31.6 pg (ref 26.0–34.0)
MCHC: 34.7 g/dL (ref 30.0–36.0)
MCV: 91.2 fL (ref 80.0–100.0)
Platelets: 134 10*3/uL — ABNORMAL LOW (ref 150–400)
RBC: 3.54 MIL/uL — ABNORMAL LOW (ref 3.87–5.11)
RDW: 12.7 % (ref 11.5–15.5)
WBC: 4.9 10*3/uL (ref 4.0–10.5)
nRBC: 0 % (ref 0.0–0.2)

## 2019-12-12 LAB — HEMOGLOBIN A1C
Hgb A1c MFr Bld: 10.8 % — ABNORMAL HIGH (ref 4.8–5.6)
Mean Plasma Glucose: 263.26 mg/dL

## 2019-12-12 LAB — POTASSIUM: Potassium: 3.6 mmol/L (ref 3.5–5.1)

## 2019-12-12 LAB — HIV ANTIBODY (ROUTINE TESTING W REFLEX): HIV Screen 4th Generation wRfx: NONREACTIVE

## 2019-12-12 LAB — MAGNESIUM: Magnesium: 1.8 mg/dL (ref 1.7–2.4)

## 2019-12-12 LAB — PHOSPHORUS: Phosphorus: 3.2 mg/dL (ref 2.5–4.6)

## 2019-12-12 LAB — TSH: TSH: 1.413 u[IU]/mL (ref 0.350–4.500)

## 2019-12-12 MED ORDER — HUMULIN R U-500 KWIKPEN 500 UNIT/ML ~~LOC~~ SOPN: 70.0000 [IU] | PEN_INJECTOR | Freq: Three times a day (TID) | SUBCUTANEOUS | 0 refills | Status: DC

## 2019-12-12 MED ORDER — INSULIN REGULAR HUMAN (CONC) 500 UNIT/ML ~~LOC~~ SOPN
70.0000 [IU] | PEN_INJECTOR | Freq: Three times a day (TID) | SUBCUTANEOUS | Status: DC
  Administered 2019-12-12: 70 [IU] via SUBCUTANEOUS
  Filled 2019-12-12 (×2): qty 3

## 2019-12-12 NOTE — Discharge Summary (Addendum)
[place] [facility] [place] NAME: Jessica Montoya    MR#:  [id number]  DATE OF BIRTH:  Mar 23, 1951  DATE OF ADMISSION:  12/11/2019 ADMITTING PHYSICIAN: Toy Baker, MD  DATE OF DISCHARGE: 12/12/2019  PRIMARY CARE PHYSICIAN: [name], NP    ADMISSION DIAGNOSIS:  Dysuria [R30.0] AKI (acute kidney injury) ([place]) [N17.9] Hypotension, unspecified hypotension type [I95.9]  DISCHARGE DIAGNOSIS:  Uncontrolled DM-2 with CKD-III and peripheral neuropathy Hyponatremia due to high sugars Dehydration--improved SECONDARY DIAGNOSIS:   Past Medical History:  Diagnosis Date  . Anxiety   . Arthritis    knees  . Chronic pain   . CKD (chronic kidney disease), stage II   . Depression   . Diabetes mellitus without complication ([place])   . Diabetic neuropathy ([place])   . Fatigue    a. 05/2018 Echo: EF 60-65%, no rwma, nl RV fxn; b. 05/2018 Lexiscan MV: EF>65%. No ischemia/infarct.  . Hyperlipidemia   . Hypertension   . Insomnia   . Long term current use of insulin ([place])   . Peripheral neuropathy   . Vertigo    2x/month  . Wears dentures    full upper and lower    HOSPITAL COURSE:  Jessica Montoya is a 69 y.o. female with medical history significant of Covid infection in January 15th  2021, DM2, HTN, HLD, CKD, depression, post Covid syndrome vertigo history of colon cancer Presented with yesterday she was seen by her primary care provider her blood sugar was in 600s.  Uncontrolled type II diabetes with CKD stage III and diabetic neuropathy -patient presented with sugars of 600 at primary care physician's office. -She was mildly dehydrated with sodium of 128 and low chloride -received IV fluids -feels a lot better. Blood pressure stable -patient on Humulin R 500-70 units TID -she follows with Dr. [name] endocrinology as outpatient -A1c 10.8 -patient overall feels a lot better today  Abnormal UA -patient denies any symptoms of dysuria, fever  nausea or vomiting. She received one dose of IV Rocephin in the ER. I will hold off on further antibiotics given no symptoms.  Dehydration-suspected due to elevated sugars -improved with IV fluids -patient presented with hyponatremia sodium 128 improved as sugars improved high fifth of April and today stent they can give urinary medication given-clinically feels better -creatinine 1.6, patient has history of CKD stage III  Hypertension continue home meds with low-dose of lisinopril  hyperlipidemia  -continue statins  Diabetic neuropathy continue gabapentin  history of COVID-19 in January  Patient overall feels better. Will discharge her to home. Patient is agreeable . she will follow-up with Dr. [name] on [place] 5th.   CONSULTS OBTAINED:    DRUG ALLERGIES:   Allergies  Allergen Reactions  . Compazine [Prochlorperazine Edisylate] Rash  . Exenatide Nausea Only  . Insulin Aspart Nausea Only    DISCHARGE MEDICATIONS:   Allergies as of 12/12/2019      Reactions   Compazine [prochlorperazine Edisylate] Rash   Exenatide Nausea Only   Insulin Aspart Nausea Only      Medication List    TAKE these medications   aspirin 81 MG tablet Take 81 mg by mouth daily.   ferrous fumarate 325 (106 Fe) MG Tabs tablet Commonly known as: HEMOCYTE - 106 mg FE Take 1 tablet by mouth daily.   FLUoxetine 40 MG capsule Commonly known as: PROZAC Take 1 capsule (40 mg total) by mouth daily.   gabapentin 300 MG capsule Commonly known as:  NEURONTIN Take 1 capsule (300 mg total) by mouth 2 (two) times daily. What changed: when to take this   HumuLIN R U-500 KwikPen 500 UNIT/ML kwikpen Generic drug: insulin regular human CONCENTRATED Inject 70 Units into the skin 3 (three) times daily with meals. What changed: See the new instructions.   hydrOXYzine 25 MG capsule Commonly known as: Vistaril Take 1 capsule (25 mg total) by mouth 2 (two) times daily as needed (dizziness).    multivitamin tablet Take 1 tablet by mouth daily.   omeprazole 20 MG capsule Commonly known as: PRILOSEC Take 1 capsule (20 mg total) by mouth daily.   ramipril 2.5 MG capsule Commonly known as: ALTACE Take 1 capsule (2.5 mg total) by mouth daily.   simvastatin 40 MG tablet Commonly known as: ZOCOR Take 1 tablet (40 mg total) by mouth daily.   traZODone 50 MG tablet Commonly known as: DESYREL Take 1 tablet (50 mg total) by mouth at bedtime.   vitamin B-12 1000 MCG tablet Commonly known as: CYANOCOBALAMIN Take 2,000 mcg by mouth daily.   VITAMIN D PO Take 2,000 Units by mouth daily.       If you experience worsening of your admission symptoms, develop shortness of breath, life threatening emergency, suicidal or homicidal thoughts you must seek medical attention immediately by calling 911 or calling your MD immediately  if symptoms less severe.  You Must read complete instructions/literature along with all the possible adverse reactions/side effects for all the Medicines you take and that have been prescribed to you. Take any new Medicines after you have completely understood and accept all the possible adverse reactions/side effects.   Please note  You were cared for by a hospitalist during your hospital stay. If you have any questions about your discharge medications or the care you received while you were in the hospital after you are discharged, you can call the unit and asked to speak with the hospitalist on call if the hospitalist that took care of you is not available. Once you are discharged, your primary care physician will handle any further medical issues. Please note that NO REFILLS for any discharge medications will be authorized once you are discharged, as it is imperative that you return to your primary care physician (or establish a relationship with a primary care physician if you do not have one) for your aftercare needs so that they can reassess your need for  medications and monitor your lab values. Today   SUBJECTIVE   Overall feels better than yesterday. Cannot sleep last night since IV was beeping intermittently. Denies any symptoms. No dysuria.  VITAL SIGNS:  Blood pressure (!) 149/72, pulse 79, temperature 98.4 F (36.9 C), resp. rate 18, height 5\' 6"  (1.676 m), weight 69.1 kg, SpO2 99 %.  I/O:    Intake/Output Summary (Last 24 hours) at 12/12/2019 0803 Last data filed at 12/12/2019 0700 Gross per 24 hour  Intake 1542.05 ml  Output 700 ml  Net 842.05 ml    PHYSICAL EXAMINATION:  GENERAL:  69 y.o.-year-old patient lying in the bed with no acute distress.  EYES: Pupils equal, round, reactive to light and accommodation. No scleral icterus.  HEENT: Head atraumatic, normocephalic. Oropharynx and nasopharynx clear.  NECK:  Supple, no jugular venous distention. No thyroid enlargement, no tenderness.  LUNGS: Normal breath sounds bilaterally, no wheezing, rales,rhonchi or crepitation. No use of accessory muscles of respiration.  CARDIOVASCULAR: S1, S2 normal. No murmurs, rubs, or gallops.  ABDOMEN: Soft, non-tender, non-distended. Bowel sounds  present. No organomegaly or mass.  EXTREMITIES: No pedal edema, cyanosis, or clubbing.  NEUROLOGIC: Cranial nerves II through XII are intact. Muscle strength 5/5 in all extremities. Sensation intact. Gait not checked.  PSYCHIATRIC: The patient is alert and oriented x 3.  SKIN: No obvious rash, lesion, or ulcer.   DATA REVIEW:   CBC  Recent Labs  Lab 12/12/19 0419  WBC 4.9  HGB 11.2*  HCT 32.3*  PLT 134*    Chemistries  Recent Labs  Lab 12/10/19 1652 12/10/19 1652 12/11/19 1647 12/11/19 1647 12/12/19 0419  NA 128*  --  136  --   --   K 5.0   < > 3.2*   < > 3.6  CL 89*   < > 97*  --   --   CO2 24   < > 24  --   --   GLUCOSE 610*  --  139*  --   --   BUN 19  --  23  --   --   CREATININE 1.07*   < > 1.66*  --   --   CALCIUM 10.1   < > 9.7  --   --   MG  --   --  1.9   < > 1.8   AST 20  --   --   --   --   ALT 36*  --   --   --   --   ALKPHOS 71  --   --   --   --   BILITOT 0.8  --   --   --   --    < > = values in this interval not displayed.    Microbiology Results   No results found for this or any previous visit (from the past 240 hour(s)).  RADIOLOGY:  DG Chest Portable 1 View  Result Date: 12/11/2019 CLINICAL DATA:  Weakness EXAM: PORTABLE CHEST 1 VIEW COMPARISON:  11/29/2012 FINDINGS: Cardiac shadows within normal limits. The lungs are well aerated bilaterally. No focal infiltrate is seen. Minimal platelike atelectasis in the left base is noted. No bony abnormality is seen. IMPRESSION: Minimal left basilar atelectasis. Electronically Signed   By: Inez Catalina M.D.   On: 12/11/2019 19:01     CODE STATUS:     Code Status Orders  (From admission, onward)         Start     Ordered   12/11/19 2032  Full code  Continuous     12/11/19 2031        Code Status History    This patient has a current code status but no historical code status.   Advance Care Planning Activity       TOTAL TIME TAKING CARE OF THIS PATIENT: *40* minutes.    Fritzi Mandes M.D  [facility]    CC: Primary care physician; [name], NP

## 2019-12-12 NOTE — Progress Notes (Signed)
12/12/2019 10:53 AM  Jessica Montoya to be D/C'd Home per MD order.  Discussed prescriptions and follow up appointments with the patient. Prescriptions given to patient, medication list explained in detail. Pt verbalized understanding.  Allergies as of 12/12/2019      Reactions   Compazine [prochlorperazine Edisylate] Rash   Exenatide Nausea Only   Insulin Aspart Nausea Only      Medication List    TAKE these medications   aspirin 81 MG tablet Take 81 mg by mouth daily. Notes to patient: Morning 12/12/19   ferrous fumarate 325 (106 Fe) MG Tabs tablet Commonly known as: HEMOCYTE - 106 mg FE Take 1 tablet by mouth daily. Notes to patient: Morning 12/12/19   FLUoxetine 40 MG capsule Commonly known as: PROZAC Take 1 capsule (40 mg total) by mouth daily. Notes to patient: Morning 12/12/19   gabapentin 300 MG capsule Commonly known as: NEURONTIN Take 1 capsule (300 mg total) by mouth 2 (two) times daily. What changed: when to take this Notes to patient: Morning 12/12/19   HumuLIN R U-500 KwikPen 500 UNIT/ML kwikpen Generic drug: insulin regular human CONCENTRATED Inject 70 Units into the skin 3 (three) times daily with meals. What changed: See the new instructions. Notes to patient: With afternoon meal 12/12/19   hydrOXYzine 25 MG capsule Commonly known as: Vistaril Take 1 capsule (25 mg total) by mouth 2 (two) times daily as needed (dizziness). Notes to patient: As needed   multivitamin tablet Take 1 tablet by mouth daily. Notes to patient: Morning 12/12/19   omeprazole 20 MG capsule Commonly known as: PRILOSEC Take 1 capsule (20 mg total) by mouth daily. Notes to patient: Morning 12/12/19   ramipril 2.5 MG capsule Commonly known as: ALTACE Take 1 capsule (2.5 mg total) by mouth daily. Notes to patient: Morning 12/12/19   simvastatin 40 MG tablet Commonly known as: ZOCOR Take 1 tablet (40 mg total) by mouth daily. Notes to patient: Morning 12/12/19   traZODone 50  MG tablet Commonly known as: DESYREL Take 1 tablet (50 mg total) by mouth at bedtime. Notes to patient: Before bed 12/12/19   vitamin B-12 1000 MCG tablet Commonly known as: CYANOCOBALAMIN Take 2,000 mcg by mouth daily. Notes to patient: Morning 12/12/19   VITAMIN D PO Take 2,000 Units by mouth daily. Notes to patient: Morning 12/12/19       Vitals:   12/12/19 0558 12/12/19 0752  BP: (!) 137/47 (!) 149/72  Pulse: 70 79  Resp: 18 18  Temp: 98.2 F (36.8 C) 98.4 F (36.9 C)  SpO2: 97% 99%    Skin clean, dry and intact without evidence of skin break down, no evidence of skin tears noted. IV catheter discontinued intact. Site without signs and symptoms of complications. Dressing and pressure applied. Pt denies pain at this time. No complaints noted.  An After Visit Summary was printed and given to the patient. Patient escorted via [place], and D/C home via private auto.  [name]

## 2019-12-12 NOTE — Evaluation (Signed)
Occupational Therapy Evaluation Patient Details Name: Jessica Montoya MRN: [id number] DOB: Mar 24, 1951 Today's Date: 12/12/2019    History of Present Illness Jessica Montoya is a 69 y.o. female with medical history significant of Covid infection in January 15th  2021, DM2, HTN, HLD, CKD, depression, post Covid syndrome vertigo history of colon cancer.  Presented to ED on 12/11/19 with hyperglycemia, increased urination and headaches.   Clinical Impression   Patient presents after ED visit due to hyperglycemia and AKI.  Patient previously living at home with husband and I with all (B/I)ADLs and functional mobility.  Patient states she occasionally uses a [facility] for functional mobility due to previous injury.  Patient is I with bed mobility and MOD I with short functional mobility to restroom.  MOD I for LB dressing and toileting tasks.  Recommending SPV for bathing at this time.  Patient states she has ordered a shower chair.  Patient appears to be close to baseline.  Provided education on energy conservation techniques to utilize within the home.  Patient verbalized understanding.  Recommending evaluation only and no follow up OT at this time.      Follow Up Recommendations  No OT follow up    Equipment Recommendations  Other (comment)(Patient states she has already ordered a shower chair)    Recommendations for Other Services       Precautions / Restrictions Precautions Precautions: Fall Restrictions Weight Bearing Restrictions: No      Mobility Bed Mobility Overal bed mobility: Independent             General bed mobility comments: No use of bed rails or need for increased time  Transfers Overall transfer level: Modified independent Equipment used: Straight cane             General transfer comment: Demonstrates good safety and use of body mechanics    Balance                                           ADL either performed or assessed with  clinical judgement   ADL Overall ADL's : Needs assistance/impaired                                       General ADL Comments: Recommending SPV for bathing at this time.  Otherwise patient is MOD I with dressing and toileting.  SPV for long distances of functional mobility.     Vision Patient Visual Report: No change from baseline       Perception     Praxis      Pertinent Vitals/Pain Pain Assessment: No/denies pain     Hand Dominance Right   Extremity/Trunk Assessment Upper Extremity Assessment Upper Extremity Assessment: Overall WFL for tasks assessed(slightly impaired grip)   Lower Extremity Assessment Lower Extremity Assessment: Overall WFL for tasks assessed   Cervical / Trunk Assessment Cervical / Trunk Assessment: Normal   Communication Communication Communication: No difficulties   Cognition Arousal/Alertness: Awake/alert Behavior During Therapy: WFL for tasks assessed/performed Overall Cognitive Status: Within Functional Limits for tasks assessed                                 General Comments: A&Ox4, pleasant   General Comments  Patient  appears to be at baseline    Exercises Other Exercises Other Exercises: Education on goals and role of OT in hospital setting Other Exercises: Educated on use of energy conservation techniques within home Other Exercises: General safety education on use of call light, bed controls, etc   Shoulder Instructions      Home Living Family/patient expects to be discharged to:: Private residence Living Arrangements: Spouse/significant other Available Help at Discharge: Family Type of Home: House Home Access: Stairs to enter Technical brewer of Steps: 1 Entrance Stairs-Rails: None(Patient states back entrance has one rail) Home Layout: One level     Bathroom Shower/Tub: Tub/shower unit         Home Equipment: [place] - single point;Grab bars - tub/shower   Additional Comments:  Patient states she has ordered a shower chair.      Prior Functioning/Environment Level of Independence: Independent;Independent with assistive device(s)        Comments: Patient occasionally used SPC for functional mobility.  Otherwise I with all ADLs including driving and community tasks.        OT Problem List:        OT Treatment/Interventions:      OT Goals(Current goals can be found in the care plan section) Acute Rehab OT Goals Patient Stated Goal: Go home OT Goal Formulation: With patient Time For Goal Achievement: 12/12/19 Potential to Achieve Goals: Good  OT Frequency:     Barriers to D/C:            Co-evaluation              AM-PAC OT "6 Clicks" Daily Activity     Outcome Measure Help from another person eating meals?: None Help from another person taking care of personal grooming?: None Help from another person toileting, which includes using toliet, bedpan, or urinal?: None Help from another person bathing (including washing, rinsing, drying)?: A Little Help from another person to put on and taking off regular upper body clothing?: None Help from another person to put on and taking off regular lower body clothing?: None 6 Click Score: 23   End of Session Equipment Utilized During Treatment: Other (comment)(SPC)  Activity Tolerance: Patient tolerated treatment well Patient left: in bed;with call bell/phone within reach                   Time: 0915-0928 OT Time Calculation (min): 13 min Charges:  OT General Charges $OT Visit: 1 Visit OT Evaluation $OT Eval Low Complexity: 1 Low OT Treatments $Self Care/Home Management : 8-22 mins  Baldomero Lamy, MS, OTR/L 12/12/19, 9:44 AM

## 2019-12-12 NOTE — Discharge Instructions (Signed)
keep log of your sugars as before and review with Dr [name]

## 2019-12-12 NOTE — Progress Notes (Signed)
Inpatient Diabetes Program Recommendations  AACE/ADA: New Consensus Statement on Inpatient Glycemic Control (2015)  Target Ranges:  Prepandial:   less than 140 mg/dL      Peak postprandial:   less than 180 mg/dL (1-2 hours)      Critically ill patients:  140 - 180 mg/dL   Lab Results  Component Value Date   GLUCAP 276 (H) 12/12/2019   HGBA1C 10.8 (H) 12/11/2019    Review of Glycemic Control  Diabetes history: DM2 Outpatient Diabetes medications: U-500 65 units tid Current orders for Inpatient glycemic control: U-500 70 units tid + Regular insulin correction scale 0-9 units q 4 hrs.  Inpatient Diabetes Program Recommendations:   Spoke with patient via phone and noted patient is getting ready for discharge home. Patient states she keeps her appointments regularly with Dr. [name] and has an upcoming appointment-staff is attempting to move appointment sooner than scheduled. Patient states she takes her insulin as prescribed, limits sugary drinks and carbohydrates. Patient has a Living Well With diabetes book from prior admission. Discussed A1c of 10.8 and last A1c patient reports was 11.0 @ Dr. Joycie Peek office. Encouraged patient to continue to watch nutrition intake, take insulin as prescribed, and keep appointments (take meter or log of CBGs to appointments).  Thank you, Nani Gasser. Leslie Jester, RN, MSN, CDE  Diabetes Coordinator Inpatient Glycemic Control Team Team Pager [phone] (8am-5pm) 12/12/2019 9:42 AM

## 2019-12-12 NOTE — Progress Notes (Signed)
PT Cancellation Note  Patient Details Name: Jessica Montoya MRN: [id number] DOB: 09-Feb-1951   Cancelled Treatment:    Reason Eval/Treat Not Completed: PT screened, no needs identified, will sign off(Consult received and chart reviewed. Per OT, patient currently mod indep for transfers and mobility; at baseline level of functional ability for all mobility needs.  No acute PT needs identified.  Will complete order at this time; please re-consult should needs change.)   Shloima Clinch H. Owens Shark, PT, DPT, NCS 12/12/19, 10:03 AM [phone]

## 2019-12-17 ENCOUNTER — Other Ambulatory Visit: Payer: Self-pay | Admitting: Nurse Practitioner

## 2019-12-18 ENCOUNTER — Encounter: Payer: Self-pay | Admitting: Gastroenterology

## 2019-12-18 ENCOUNTER — Inpatient Hospital Stay: Payer: Medicare Other | Admitting: Nurse Practitioner

## 2019-12-18 DIAGNOSIS — M8589 Other specified disorders of bone density and structure, multiple sites: Secondary | ICD-10-CM | POA: Diagnosis not present

## 2019-12-18 DIAGNOSIS — E1165 Type 2 diabetes mellitus with hyperglycemia: Secondary | ICD-10-CM | POA: Diagnosis not present

## 2019-12-18 DIAGNOSIS — E1129 Type 2 diabetes mellitus with other diabetic kidney complication: Secondary | ICD-10-CM | POA: Diagnosis not present

## 2019-12-18 DIAGNOSIS — E1121 Type 2 diabetes mellitus with diabetic nephropathy: Secondary | ICD-10-CM | POA: Diagnosis not present

## 2019-12-18 DIAGNOSIS — E1142 Type 2 diabetes mellitus with diabetic polyneuropathy: Secondary | ICD-10-CM | POA: Diagnosis not present

## 2019-12-19 ENCOUNTER — Telehealth: Payer: Self-pay | Admitting: Pharmacist

## 2019-12-19 ENCOUNTER — Telehealth: Payer: Self-pay

## 2019-12-19 ENCOUNTER — Encounter: Payer: Self-pay | Admitting: Gastroenterology

## 2019-12-19 ENCOUNTER — Ambulatory Visit (INDEPENDENT_AMBULATORY_CARE_PROVIDER_SITE_OTHER): Payer: Medicare Other | Admitting: Gastroenterology

## 2019-12-19 ENCOUNTER — Other Ambulatory Visit: Payer: Self-pay

## 2019-12-19 ENCOUNTER — Ambulatory Visit (INDEPENDENT_AMBULATORY_CARE_PROVIDER_SITE_OTHER): Payer: Medicare Other | Admitting: Pharmacist

## 2019-12-19 DIAGNOSIS — E1165 Type 2 diabetes mellitus with hyperglycemia: Secondary | ICD-10-CM

## 2019-12-19 DIAGNOSIS — R1310 Dysphagia, unspecified: Secondary | ICD-10-CM

## 2019-12-19 DIAGNOSIS — E1140 Type 2 diabetes mellitus with diabetic neuropathy, unspecified: Secondary | ICD-10-CM

## 2019-12-19 DIAGNOSIS — Z85038 Personal history of other malignant neoplasm of large intestine: Secondary | ICD-10-CM

## 2019-12-19 DIAGNOSIS — E1122 Type 2 diabetes mellitus with diabetic chronic kidney disease: Secondary | ICD-10-CM | POA: Diagnosis not present

## 2019-12-19 DIAGNOSIS — IMO0002 Reserved for concepts with insufficient information to code with codable children: Secondary | ICD-10-CM

## 2019-12-19 MED ORDER — NA SULFATE-K SULFATE-MG SULF 17.5-3.13-1.6 GM/177ML PO SOLN: 354.0000 mL | Freq: Once | ORAL | 0 refills | Status: AC

## 2019-12-19 MED ORDER — LOPERAMIDE HCL 2 MG PO TABS: 2.0000 mg | ORAL_TABLET | Freq: Every day | ORAL | 0 refills | Status: AC

## 2019-12-19 NOTE — Telephone Encounter (Signed)
Was speaking with [facility] today, they noted that they called yesterday requesting that the patient's most recent lab results be faxed to them.   I can see the phone note that they called in [place], but do not see where it was documented on our end. Unsure if they spoke with [facility].   I let Caryl Pina know that her most recent lab results would be from her hospitalization and should be available in Care Everywhere, but that I would pass along the message to our clinical team as well

## 2019-12-19 NOTE — Telephone Encounter (Signed)
Message was sent as a staff message so it is not documented in chart. Labs were faxed yesterday.

## 2019-12-19 NOTE — Telephone Encounter (Signed)
Called [facility] and left a message for medical records to find out if patient had colonoscopy done through there office and if so If they could fax the report to [place]

## 2019-12-19 NOTE — Patient Instructions (Signed)
Visit Information  Goals Addressed            This Visit's Progress     Patient Stated   . PharmD "I can't afford my insulin" (pt-stated)       CARE PLAN ENTRY (see longtitudinal plan of care for additional care plan information)  Current Barriers:  . Diabetes: uncontrolled; most recent A1c 10.8% . Recent hospitalization for hyperglyceima  . Current antihyperglycemic regimen: Humulin R U500 65 units TID; Trulicity [id number] mg once weekly started yesterday per Dr. [name] o Previously approved for Humulin R U500 assistance through [facility], needs to reapply for 2021 . Cardiovascular risk reduction: o Current hypertensive regimen: ramipril 2.5 mg daily o Current hyperlipidemia regimen: simvastatin 40 mg daily  Pharmacist Clinical Goal(s):  [name] Over the next 90 days, patient with work with PharmD and primary care provider to address optimized glycemic regimen  Interventions: . [place]. Appears patient was incorrectly entered last year as having no Medicare Part D, so she was originally enrolled 08/21/2019-08/20/2020. Requested for U500 order was placed 12/17/19, and being processed by the pharmacy now. Patient will be contacted for shipping.  . Visit w/ Dr. [name] yesterday and Trulicity [id number] mg weekly was started. Contacted Dr. Joycie Peek office, left message for the RN to call me back. The prescription parts of the [employer] application (pages 5 and 6) for the new Trulicity order just need to be faxed into [employer] at [phone]. Alternatively, they can call pharmacist voicemail and leave a verbal script at [phone] . The office also requested a fax of the most recent lab results on this patient. I informed that all recent lab results from her hospitalization should be apparent in Care Everywhere, but have passed this request along to the clinical team.  Patient Self Care Activities:  . Patient will check blood glucose BID , document, and provide at future  appointments . Patient will take medications as prescribed . Patient will report any questions or concerns to provider   Please see past updates related to this goal by clicking on the "Past Updates" button in the selected goal         Patient verbalizes understanding of instructions provided today.   Plan:  - Will outreach as previously scheduled  [name], PharmD, [place] [phone]

## 2019-12-19 NOTE — Chronic Care Management (AMB) (Signed)
Chronic Care Management   Follow Up Note   12/19/2019 Name: Aaleeyah Croslin MRN: [id number] DOB: 1951-09-25  Referred by: [name], NP Reason for referral : Chronic Care Management (Medication Management)   Avry Kovacevich is a 69 y.o. year old female who is a primary care patient of [name], [name], NP. The CCM team was consulted for assistance with chronic disease management and care coordination needs.   Care coordination completed today.   Review of patient status, including review of consultants reports, relevant laboratory and other test results, and collaboration with appropriate care team members and the patient's provider was performed as part of comprehensive patient evaluation and provision of chronic care management services.    SDOH (Social Determinants of Health) assessments performed: No See Care Plan activities for detailed interventions related to [facility])     Outpatient Encounter Medications as of 12/19/2019  Medication Sig  . aspirin 81 MG tablet Take 81 mg by mouth daily.  . Cholecalciferol (VITAMIN D PO) Take 2,000 Units by mouth daily.   . Dulaglutide 0.75 MG/0.5ML SOPN Inject into the skin.  . ferrous fumarate (HEMOCYTE - 106 MG FE) 325 (106 FE) MG TABS tablet Take 1 tablet by mouth daily.  [name] FLUoxetine (PROZAC) 40 MG capsule Take 1 capsule (40 mg total) by mouth daily.  [name] gabapentin (NEURONTIN) 300 MG capsule Take 1 capsule (300 mg total) by mouth 2 (two) times daily. (Patient taking differently: Take 300 mg by mouth daily. )  . HUMULIN R U-500 KWIKPEN 500 UNIT/ML kwikpen Inject 70 Units into the skin 3 (three) times daily with meals.  . hydrOXYzine (VISTARIL) 25 MG capsule TAKE 1 CAPSULE (25 MG TOTAL) BY MOUTH 2 (TWO) TIMES DAILY AS NEEDED (DIZZINESS).  [name] loperamide (IMODIUM A-D) 2 MG tablet Take 1 tablet (2 mg total) by mouth daily for 10 days. Stop if no Bowel movement for more than 1 day or if stools get hard  . Multiple Vitamin (MULTIVITAMIN)  tablet Take 1 tablet by mouth daily.  . Na Sulfate-K Sulfate-Mg Sulf 17.5-3.13-1.6 GM/177ML SOLN Take 354 mLs by mouth once for 1 dose.  [name] omeprazole (PRILOSEC) 20 MG capsule Take 1 capsule (20 mg total) by mouth daily.  . ramipril (ALTACE) 2.5 MG capsule Take 1 capsule (2.5 mg total) by mouth daily.  . simvastatin (ZOCOR) 40 MG tablet Take 1 tablet (40 mg total) by mouth daily.  . traZODone (DESYREL) 50 MG tablet Take 1 tablet (50 mg total) by mouth at bedtime.  . vitamin B-12 (CYANOCOBALAMIN) 1000 MCG tablet Take 2,000 mcg by mouth daily.   No facility-administered encounter medications on file as of 12/19/2019.     Objective:   Goals Addressed            This Visit's Progress     Patient Stated   . PharmD "I can't afford my insulin" (pt-stated)       CARE PLAN ENTRY (see longtitudinal plan of care for additional care plan information)  Current Barriers:  . Diabetes: uncontrolled; most recent A1c 10.8% . Recent hospitalization for hyperglyceima  . Current antihyperglycemic regimen: Humulin R U500 65 units TID; Trulicity A999333 mg once weekly started yesterday per Dr. [name] o Previously approved for Humulin R U500 assistance through [facility], needs to reapply for 2021 . Cardiovascular risk reduction: o Current hypertensive regimen: ramipril 2.5 mg daily o Current hyperlipidemia regimen: simvastatin 40 mg daily  Pharmacist Clinical Goal(s):  [name] Over the next 90 days, patient with  work with PharmD and primary care provider to address optimized glycemic regimen  Interventions: . [place]. Appears patient was incorrectly entered last year as having no Medicare Part D, so she was originally enrolled 08/21/2019-08/20/2020. Requested for U500 order was placed 12/17/19, and being processed by the pharmacy now. Patient will be contacted for shipping.  . Visit w/ Dr. [name] yesterday and Trulicity A999333 mg weekly was started. Contacted Dr. Joycie Peek office, left message for the  RN to call me back. The prescription parts of the [employer] application (pages 5 and 6) for the new Trulicity order just need to be faxed into [employer] at [phone]. Alternatively, they can call pharmacist voicemail and leave a verbal script at [phone] . The office also requested a fax of the most recent lab results on this patient. I informed that all recent lab results from her hospitalization should be apparent in Care Everywhere, but have passed this request along to the clinical team.  Patient Self Care Activities:  . Patient will check blood glucose BID , document, and provide at future appointments . Patient will take medications as prescribed . Patient will report any questions or concerns to provider   Please see past updates related to this goal by clicking on the "Past Updates" button in the selected goal          Plan:  - Will outreach as previously scheduled  [name], PharmD, [place] [phone]

## 2019-12-20 ENCOUNTER — Inpatient Hospital Stay: Payer: Medicare Other | Admitting: Nurse Practitioner

## 2019-12-20 NOTE — Progress Notes (Signed)
Jessica Montoya [address]  [place]  [place], [place] [address]  Main: [phone]  Fax: [phone]   Gastroenterology Consultation  Referring Provider:     [name], NP Primary Care Physician:  [name], NP Reason for Consultation:     Dysphagia        HPI:   Virtual Visit via Telephone Note  I connected with patient on 12/20/19 at 10:45 AM EDT by telephone and verified that I am speaking with the correct person using two identifiers.   I discussed the limitations, risks, security and privacy concerns of performing an evaluation and management service by telephone and the availability of in person appointments. I also discussed with the patient that there may be a patient responsible charge related to this service. The patient expressed understanding and agreed to proceed.  Location of the patient: Home Location of provider: Home Participating persons: Patient and provider only   History of Present Illness: Chief Complaint  Patient presents with  . New Patient (Initial Visit)  . Dysphagia    Patient is having throuble with anything she swallows. Has burning in throat   . Colon Cancer Screening     Jessica Montoya is a 69 y.o. y/o female referred for consultation & management  by Dr. [name], NP.  Patient reports intermittent dysphagia to solid foods for the last 2 to 3 months.  No weight loss.  No episodes of food impaction.  No dysphagia to liquids .  Patient has history of colon cancer but is confused about the timeline.  When I asked her about her last colonoscopy and her history of colon cancer, she states her last colonoscopy in the surgery was in 2000 and she has not had a colonoscopy since then.  However, looking at the limited records we have for this, and she had hemicolectomy for cancer of the cecum in 2014 by Dr. Parthenia Ames me.  The note is available under operative reports under notes under chart review.  Patient states  she did not have to have chemotherapy or radiation as the cancer was limited to the colon.  Past Medical History:  Diagnosis Date  . Anxiety   . Arthritis    knees  . Chronic pain   . CKD (chronic kidney disease), stage II   . Depression   . Diabetes mellitus without complication ([place])   . Diabetic neuropathy ([place])   . Fatigue    a. 05/2018 Echo: EF 60-65%, no rwma, nl RV fxn; b. 05/2018 Lexiscan MV: EF>65%. No ischemia/infarct.  . Hyperlipidemia   . Hypertension   . Insomnia   . Long term current use of insulin ([place])   . Peripheral neuropathy   . Vertigo    2x/month  . Wears dentures    full upper and lower    Past Surgical History:  Procedure Laterality Date  . ABDOMINAL HYSTERECTOMY    . CATARACT EXTRACTION W/PHACO Left 02/20/2019   Procedure: CATARACT EXTRACTION PHACO AND INTRAOCULAR LENS PLACEMENT ([place]) LEFT DIABETES;  Surgeon: Birder Robson, MD;  Location: [place];  Service: Ophthalmology;  Laterality: Left;  . CATARACT EXTRACTION W/PHACO Right 03/27/2019   Procedure: CATARACT EXTRACTION PHACO AND INTRAOCULAR LENS PLACEMENT ([place])  RIGHT DIABETIC;  Surgeon: Birder Robson, MD;  Location: [place];  Service: Ophthalmology;  Laterality: Right;  Diabetic - insulin  . COLON SURGERY      Prior to Admission medications   Medication Sig Start Date End Date  Taking? Authorizing Provider  aspirin 81 MG tablet Take 81 mg by mouth daily.   Yes [provider]  Cholecalciferol (VITAMIN D PO) Take 2,000 Units by mouth daily.    Yes [provider]  Dulaglutide 0.75 MG/0.5ML SOPN Inject into the skin. 12/18/19  Yes [provider]  ferrous fumarate (HEMOCYTE - 106 MG FE) 325 (106 FE) MG TABS tablet Take 1 tablet by mouth daily.   Yes [provider]  FLUoxetine (PROZAC) 40 MG capsule Take 1 capsule (40 mg total) by mouth daily. 12/10/19  Yes [name], Jolene T, NP  gabapentin (NEURONTIN) 300 MG capsule Take 1 capsule (300 mg  total) by mouth 2 (two) times daily. Patient taking differently: Take 300 mg by mouth daily.  07/30/19  Yes [name], Jolene T, NP  HUMULIN R U-500 KWIKPEN 500 UNIT/ML kwikpen Inject 70 Units into the skin 3 (three) times daily with meals. 12/12/19  Yes Fritzi Mandes, MD  hydrOXYzine (VISTARIL) 25 MG capsule TAKE 1 CAPSULE (25 MG TOTAL) BY MOUTH 2 (TWO) TIMES DAILY AS NEEDED (DIZZINESS). 12/17/19  Yes [name], Henrine Screws T, NP  Multiple Vitamin (MULTIVITAMIN) tablet Take 1 tablet by mouth daily.   Yes [provider]  omeprazole (PRILOSEC) 20 MG capsule Take 1 capsule (20 mg total) by mouth daily. 07/30/19  Yes [name], Jolene T, NP  ramipril (ALTACE) 2.5 MG capsule Take 1 capsule (2.5 mg total) by mouth daily. 07/30/19  Yes [name], Jolene T, NP  simvastatin (ZOCOR) 40 MG tablet Take 1 tablet (40 mg total) by mouth daily. 07/30/19  Yes [name], Jolene T, NP  traZODone (DESYREL) 50 MG tablet Take 1 tablet (50 mg total) by mouth at bedtime. 12/10/19  Yes [name], Jolene T, NP  vitamin B-12 (CYANOCOBALAMIN) 1000 MCG tablet Take 2,000 mcg by mouth daily.   Yes [provider]  loperamide (IMODIUM A-D) 2 MG tablet Take 1 tablet (2 mg total) by mouth daily for 10 days. Stop if no Bowel movement for more than 1 day or if stools get hard 12/19/19 12/29/19  Virgel Manifold, MD    Family History  Problem Relation Age of Onset  . Cancer Mother        melanoma  . Breast cancer Mother 52  . Cancer Father        pancreatic  . Hypertension Maternal Grandmother   . Cancer Maternal Grandmother        colon  . Cancer Paternal Grandmother        stomach  . Stroke Paternal Grandfather   . Cancer Maternal Grandfather        kidney     Social History   Tobacco Use  . Smoking status: Never Smoker  . Smokeless tobacco: Never Used  Substance Use Topics  . Alcohol use: No    Alcohol/week: 0.0 standard drinks  . Drug use: No    Allergies as of 12/19/2019 - Review Complete 12/19/2019    Allergen Reaction Noted  . Compazine [prochlorperazine edisylate] Rash 05/21/2015  . Exenatide Nausea Only 02/04/2016  . Insulin aspart Nausea Only 05/21/2015    Review of Systems:    All systems reviewed and negative except where noted in HPI.   Observations/Objective:  Labs: CBC    Component Value Date/Time   WBC 4.9 12/12/2019 0419   RBC 3.54 (L) 12/12/2019 0419   HGB 11.2 (L) 12/12/2019 0419   HGB 13.0 03/01/2018 1519   HCT 32.3 (L) 12/12/2019 0419   HCT 39.8 03/01/2018 1519  PLT 134 (L) 12/12/2019 0419   PLT 201 03/01/2018 1519   MCV 91.2 12/12/2019 0419   MCV 95 03/01/2018 1519   MCV 90 11/22/2013 1126   MCH 31.6 12/12/2019 0419   MCHC 34.7 12/12/2019 0419   RDW 12.7 12/12/2019 0419   RDW 13.5 03/01/2018 1519   RDW 13.3 11/22/2013 1126   LYMPHSABS 0.9 03/01/2018 1519   LYMPHSABS 0.8 (L) 11/22/2013 1126   MONOABS 0.2 11/22/2013 1126   EOSABS 0.1 03/01/2018 1519   EOSABS 0.1 11/22/2013 1126   BASOSABS 0.0 03/01/2018 1519   BASOSABS 0.0 11/22/2013 1126   CMP     Component Value Date/Time   NA 136 12/11/2019 1647   NA 128 (L) 12/10/2019 1652   NA 132 (L) 11/22/2013 1126   K 3.6 12/12/2019 0419   K 4.6 11/22/2013 1126   CL 97 (L) 12/11/2019 1647   CL 95 (L) 11/22/2013 1126   CO2 24 12/11/2019 1647   CO2 28 11/22/2013 1126   GLUCOSE 139 (H) 12/11/2019 1647   GLUCOSE 537 (HH) 11/22/2013 1126   BUN 23 12/11/2019 1647   BUN 19 12/10/2019 1652   BUN 13 11/22/2013 1126   CREATININE 1.66 (H) 12/11/2019 1647   CREATININE 1.00 11/22/2013 1126   CALCIUM 9.7 12/11/2019 1647   CALCIUM 8.6 11/22/2013 1126   PROT 7.1 12/10/2019 1652   PROT 7.5 11/22/2013 1126   ALBUMIN 4.5 12/10/2019 1652   ALBUMIN 3.9 11/22/2013 1126   AST 20 12/10/2019 1652   AST 14 (L) 11/22/2013 1126   ALT 36 (H) 12/10/2019 1652   ALT 20 11/22/2013 1126   ALKPHOS 71 12/10/2019 1652   ALKPHOS 72 11/22/2013 1126   BILITOT 0.8 12/10/2019 1652   BILITOT 0.5 11/22/2013 1126   GFRNONAA 31  (L) 12/11/2019 1647   GFRNONAA 60 (L) 11/22/2013 1126   GFRAA 36 (L) 12/11/2019 1647   GFRAA >60 11/22/2013 1126    Imaging Studies: DG Chest Portable 1 View  Result Date: 12/11/2019 CLINICAL DATA:  Weakness EXAM: PORTABLE CHEST 1 VIEW COMPARISON:  11/29/2012 FINDINGS: Cardiac shadows within normal limits. The lungs are well aerated bilaterally. No focal infiltrate is seen. Minimal platelike atelectasis in the left base is noted. No bony abnormality is seen. IMPRESSION: Minimal left basilar atelectasis. Electronically Signed   By: Inez Catalina M.D.   On: 12/11/2019 19:01    Assessment and Plan:   Maleeya Soulliere is a 69 y.o. y/o female has been referred for dysphagia and history of colon cancer  Assessment and Plan: EGD indicated for dysphagia to rule out strictures and EOE  Patient educated on dysphagia diet  Colonoscopy indicated due to history of colon cancer and hemicolectomy in 11/12/2012.  Patient seems to not have had a colonoscopy since then.   Follow Up Instructions:   I discussed the assessment and treatment plan with the patient. The patient was provided an opportunity to ask questions and all were answered. The patient agreed with the plan and demonstrated an understanding of the instructions.   The patient was advised to call back or seek an in-person evaluation if the symptoms worsen or if the condition fails to improve as anticipated.  I provided 12 minutes of non-face-to-face time during this encounter.   Virgel Manifold, MD  Speech recognition software was used to dictate the above note.

## 2019-12-21 ENCOUNTER — Ambulatory Visit: Payer: Self-pay | Admitting: Pharmacist

## 2019-12-21 DIAGNOSIS — E1165 Type 2 diabetes mellitus with hyperglycemia: Secondary | ICD-10-CM | POA: Diagnosis not present

## 2019-12-21 DIAGNOSIS — E1140 Type 2 diabetes mellitus with diabetic neuropathy, unspecified: Secondary | ICD-10-CM | POA: Diagnosis not present

## 2019-12-21 DIAGNOSIS — E1122 Type 2 diabetes mellitus with diabetic chronic kidney disease: Secondary | ICD-10-CM | POA: Diagnosis not present

## 2019-12-21 DIAGNOSIS — IMO0002 Reserved for concepts with insufficient information to code with codable children: Secondary | ICD-10-CM

## 2019-12-21 NOTE — Chronic Care Management (AMB) (Signed)
Chronic Care Management   Follow Up Note   12/21/2019 Name: Jessica Montoya MRN: [id number] DOB: [birth year]  Referred by: [name], NP Reason for referral : Chronic Care Management (Medication Management)   Jessica Montoya is a 69 y.o. year old female who is a primary care patient of [name], [name], NP. The CCM team was consulted for assistance with chronic disease management and care coordination needs.   Care coordination completed today.   Review of patient status, including review of consultants reports, relevant laboratory and other test results, and collaboration with appropriate care team members and the patient's provider was performed as part of comprehensive patient evaluation and provision of chronic care management services.    SDOH (Social Determinants of Health) assessments performed: No See Care Plan activities for detailed interventions related to [facility])     Outpatient Encounter Medications as of 12/21/2019  Medication Sig  . aspirin 81 MG tablet Take 81 mg by mouth daily.  . Cholecalciferol (VITAMIN D PO) Take 2,000 Units by mouth daily.   . Dulaglutide 0.75 MG/0.5ML SOPN Inject into the skin.  . ferrous fumarate (HEMOCYTE - 106 MG FE) 325 (106 FE) MG TABS tablet Take 1 tablet by mouth daily.  [name] FLUoxetine (PROZAC) 40 MG capsule Take 1 capsule (40 mg total) by mouth daily.  [name] gabapentin (NEURONTIN) 300 MG capsule Take 1 capsule (300 mg total) by mouth 2 (two) times daily. (Patient taking differently: Take 300 mg by mouth daily. )  . HUMULIN R U-500 KWIKPEN 500 UNIT/ML kwikpen Inject 70 Units into the skin 3 (three) times daily with meals.  . hydrOXYzine (VISTARIL) 25 MG capsule TAKE 1 CAPSULE (25 MG TOTAL) BY MOUTH 2 (TWO) TIMES DAILY AS NEEDED (DIZZINESS).  [name] loperamide (IMODIUM A-D) 2 MG tablet Take 1 tablet (2 mg total) by mouth daily for 10 days. Stop if no Bowel movement for more than 1 day or if stools get hard  . Multiple Vitamin (MULTIVITAMIN)  tablet Take 1 tablet by mouth daily.  [name] omeprazole (PRILOSEC) 20 MG capsule Take 1 capsule (20 mg total) by mouth daily.  . ramipril (ALTACE) 2.5 MG capsule Take 1 capsule (2.5 mg total) by mouth daily.  . simvastatin (ZOCOR) 40 MG tablet Take 1 tablet (40 mg total) by mouth daily.  . traZODone (DESYREL) 50 MG tablet Take 1 tablet (50 mg total) by mouth at bedtime.  . vitamin B-12 (CYANOCOBALAMIN) 1000 MCG tablet Take 2,000 mcg by mouth daily.   No facility-administered encounter medications on file as of 12/21/2019.     Objective:   Goals Addressed            This Visit's Progress     Patient Stated   . PharmD "I can't afford my insulin" (pt-stated)       CARE PLAN ENTRY (see longtitudinal plan of care for additional care plan information)  Current Barriers:  . Diabetes: uncontrolled; most recent A1c 10.8% . Recent hospitalization for hyperglycemia . Current antihyperglycemic regimen: Humulin R U500 65 units TID; Trulicity A999333 mg once weekly started earlier this week per Dr. [name] o Previously approved for Humulin R U500 assistance through [facility], needs to reapply for 2021 . Cardiovascular risk reduction: o Current hypertensive regimen: ramipril 2.5 mg daily o Current hyperlipidemia regimen: simvastatin 40 mg daily  Pharmacist Clinical Goal(s):  [name] Over the next 90 days, patient with work with PharmD and primary care provider to address optimized glycemic regimen  Interventions: . Per chart  review, they have faxed Trulicity prescription to [facility]. Will continue to follow along.  . Called patient. She received a call from [employer] that the Trulicity is set to be delivered on Tuesday. She verbalized appreciation.  [place] about getting a shower chair. Will collaborate w/ RN CM on this.   Patient Self Care Activities:  . Patient will check blood glucose BID , document, and provide at future appointments . Patient will take medications as prescribed . Patient  will report any questions or concerns to provider   Please see past updates related to this goal by clicking on the "Past Updates" button in the selected goal          Plan:  - Scheduled f/u call 02/19/20  [name], PharmD, [place] [phone]

## 2019-12-21 NOTE — Patient Instructions (Signed)
Visit Information  Goals Addressed            This Visit's Progress     Patient Stated   . PharmD "I can't afford my insulin" (pt-stated)       CARE PLAN ENTRY (see longtitudinal plan of care for additional care plan information)  Current Barriers:  . Diabetes: uncontrolled; most recent A1c 10.8% . Recent hospitalization for hyperglycemia . Current antihyperglycemic regimen: Humulin R U500 65 units TID; Trulicity A999333 mg once weekly started earlier this week per Dr. [name] o Previously approved for Humulin R U500 assistance through [facility], needs to reapply for 2021 . Cardiovascular risk reduction: o Current hypertensive regimen: ramipril 2.5 mg daily o Current hyperlipidemia regimen: simvastatin 40 mg daily  Pharmacist Clinical Goal(s):  [name] Over the next 90 days, patient with work with PharmD and primary care provider to address optimized glycemic regimen  Interventions: . Per chart review, they have faxed Trulicity prescription to [facility]. Will continue to follow along.  . Called patient. She received a call from [employer] that the Trulicity is set to be delivered on Tuesday. She verbalized appreciation.  [place] about getting a shower chair. Will collaborate w/ RN CM on this.   Patient Self Care Activities:  . Patient will check blood glucose BID , document, and provide at future appointments . Patient will take medications as prescribed . Patient will report any questions or concerns to provider   Please see past updates related to this goal by clicking on the "Past Updates" button in the selected goal         Patient verbalizes understanding of instructions provided today.    Plan:  - Scheduled f/u call 02/19/20  [name], PharmD, [place] [phone]

## 2019-12-25 ENCOUNTER — Ambulatory Visit: Payer: Self-pay | Admitting: General Practice

## 2019-12-25 NOTE — Chronic Care Management (AMB) (Signed)
°  Chronic Care Management   Outreach Note  12/25/2019 Name: Jessica Montoya MRN: [id number] DOB: 1951-07-17  Referred by: [name], NP Reason for referral : Chronic Care Management (Initial outreach attempt)   An unsuccessful telephone outreach was attempted today. The patient was referred to the case management team for assistance with care management and care coordination.   Follow Up Plan: A HIPPA compliant phone message was left for the patient providing contact information and requesting a return call.   Noreene Larsson RN, MSN, [facility] Mobile: [phone]

## 2020-01-07 ENCOUNTER — Ambulatory Visit (INDEPENDENT_AMBULATORY_CARE_PROVIDER_SITE_OTHER): Payer: Medicare Other | Admitting: Pharmacist

## 2020-01-07 DIAGNOSIS — IMO0002 Reserved for concepts with insufficient information to code with codable children: Secondary | ICD-10-CM

## 2020-01-07 DIAGNOSIS — E1122 Type 2 diabetes mellitus with diabetic chronic kidney disease: Secondary | ICD-10-CM

## 2020-01-07 NOTE — Patient Instructions (Signed)
Visit Information  Goals Addressed            This Visit's Progress     Patient Stated   . PharmD "I can't afford my insulin" (pt-stated)       CARE PLAN ENTRY (see longtitudinal plan of care for additional care plan information)  Current Barriers:  . Diabetes: uncontrolled; most recent A1c 10.8% o Patient calls today with questions about a letter she received from [facility] indicating that additional information was needed to process her application . Current antihyperglycemic regimen: Humulin R U500 65 units TID; Trulicity A999333 mg once weekly started a few weeks ago by Dr. [name]. Intention to increase to 1.5 mg weekly, prescription faxed to [facility] on 12/26/19 o Previously approved for Humulin U500 assistance 08/21/2019-08/20/2020 . Cardiovascular risk reduction: o Current hypertensive regimen: ramipril 2.5 mg daily o Current hyperlipidemia regimen: simvastatin 40 mg daily  Pharmacist Clinical Goal(s):  [name] Over the next 90 days, patient with work with PharmD and primary care provider to address optimized glycemic regimen  Interventions: . [place]. They had not yet processed the 3/24 prescription for Trulicity 3 mg weekly. Asked them to do so; they were going to process today and expedite shipment. I encouraged patient to let Dr. [name] know at the appointment tomorrow that the increased dose was on its way.   Patient Self Care Activities:  . Patient will check blood glucose BID , document, and provide at future appointments . Patient will take medications as prescribed . Patient will report any questions or concerns to provider   Please see past updates related to this goal by clicking on the "Past Updates" button in the selected goal         Patient verbalizes understanding of instructions provided today.  Plan:  - Will outreach patient as previously scheduled  [name], PharmD, [place] [phone]

## 2020-01-07 NOTE — Chronic Care Management (AMB) (Signed)
Chronic Care Management   Follow Up Note   01/07/2020 Name: Jessica Montoya MRN: [id number] DOB: Feb 22, 1951  Referred by: [name], NP Reason for referral : Chronic Care Management (Medication Management)   Jessica Montoya is a 69 y.o. year old female who is a primary care patient of [name], [name], NP. The CCM team was consulted for assistance with chronic disease management and care coordination needs.    Contacted patient for medication management review.   Review of patient status, including review of consultants reports, relevant laboratory and other test results, and collaboration with appropriate care team members and the patient's provider was performed as part of comprehensive patient evaluation and provision of chronic care management services.    SDOH (Social Determinants of Health) assessments performed: No See Care Plan activities for detailed interventions related to [facility])     Outpatient Encounter Medications as of 01/07/2020  Medication Sig Note  . aspirin 81 MG tablet Take 81 mg by mouth daily.   . Cholecalciferol (VITAMIN D PO) Take 2,000 Units by mouth daily.    . Dulaglutide 0.75 MG/0.5ML SOPN Inject into the skin. 01/07/2020: 1.5 mg ordered, but has not arrived for patient to start yet  . ferrous fumarate (HEMOCYTE - 106 MG FE) 325 (106 FE) MG TABS tablet Take 1 tablet by mouth daily.   [name] FLUoxetine (PROZAC) 40 MG capsule Take 1 capsule (40 mg total) by mouth daily.   [name] gabapentin (NEURONTIN) 300 MG capsule Take 1 capsule (300 mg total) by mouth 2 (two) times daily. (Patient taking differently: Take 300 mg by mouth daily. )   . HUMULIN R U-500 KWIKPEN 500 UNIT/ML kwikpen Inject 70 Units into the skin 3 (three) times daily with meals.   . hydrOXYzine (VISTARIL) 25 MG capsule TAKE 1 CAPSULE (25 MG TOTAL) BY MOUTH 2 (TWO) TIMES DAILY AS NEEDED (DIZZINESS).   . Multiple Vitamin (MULTIVITAMIN) tablet Take 1 tablet by mouth daily.   [name] omeprazole (PRILOSEC)  20 MG capsule Take 1 capsule (20 mg total) by mouth daily.   . ramipril (ALTACE) 2.5 MG capsule Take 1 capsule (2.5 mg total) by mouth daily.   . simvastatin (ZOCOR) 40 MG tablet Take 1 tablet (40 mg total) by mouth daily.   . traZODone (DESYREL) 50 MG tablet Take 1 tablet (50 mg total) by mouth at bedtime.   . vitamin B-12 (CYANOCOBALAMIN) 1000 MCG tablet Take 2,000 mcg by mouth daily.    No facility-administered encounter medications on file as of 01/07/2020.     Objective:   Goals Addressed            This Visit's Progress     Patient Stated   . PharmD "I can't afford my insulin" (pt-stated)       CARE PLAN ENTRY (see longtitudinal plan of care for additional care plan information)  Current Barriers:  . Diabetes: uncontrolled; most recent A1c 10.8% o Patient calls today with questions about a letter she received from [facility] indicating that additional information was needed to process her application . Current antihyperglycemic regimen: Humulin R U500 65 units TID; Trulicity A999333 mg once weekly started a few weeks ago by Dr. [name]. Intention to increase to 1.5 mg weekly, prescription faxed to [facility] on 12/26/19 o Previously approved for Humulin U500 assistance 08/21/2019-08/20/2020 . Cardiovascular risk reduction: o Current hypertensive regimen: ramipril 2.5 mg daily o Current hyperlipidemia regimen: simvastatin 40 mg daily  Pharmacist Clinical Goal(s):  [name] Over the next  90 days, patient with work with PharmD and primary care provider to address optimized glycemic regimen  Interventions: . [place]. They had not yet processed the 3/24 prescription for Trulicity 3 mg weekly. Asked them to do so; they were going to process today and expedite shipment. I encouraged patient to let Dr. [name] know at the appointment tomorrow that the increased dose was on its way.   Patient Self Care Activities:  . Patient will check blood glucose BID , document, and provide at  future appointments . Patient will take medications as prescribed . Patient will report any questions or concerns to provider   Please see past updates related to this goal by clicking on the "Past Updates" button in the selected goal          Plan:  - Will outreach patient as previously scheduled  [name], PharmD, [place] [phone]

## 2020-01-08 ENCOUNTER — Ambulatory Visit: Payer: Self-pay | Admitting: Pharmacist

## 2020-01-08 DIAGNOSIS — E1129 Type 2 diabetes mellitus with other diabetic kidney complication: Secondary | ICD-10-CM | POA: Diagnosis not present

## 2020-01-08 DIAGNOSIS — E1121 Type 2 diabetes mellitus with diabetic nephropathy: Secondary | ICD-10-CM | POA: Diagnosis not present

## 2020-01-08 DIAGNOSIS — E11649 Type 2 diabetes mellitus with hypoglycemia without coma: Secondary | ICD-10-CM | POA: Diagnosis not present

## 2020-01-08 DIAGNOSIS — E1122 Type 2 diabetes mellitus with diabetic chronic kidney disease: Secondary | ICD-10-CM

## 2020-01-08 DIAGNOSIS — E1142 Type 2 diabetes mellitus with diabetic polyneuropathy: Secondary | ICD-10-CM | POA: Diagnosis not present

## 2020-01-08 DIAGNOSIS — IMO0002 Reserved for concepts with insufficient information to code with codable children: Secondary | ICD-10-CM

## 2020-01-08 DIAGNOSIS — Z794 Long term (current) use of insulin: Secondary | ICD-10-CM | POA: Diagnosis not present

## 2020-01-08 NOTE — Patient Instructions (Signed)
Visit Information  Goals Addressed            This Visit's Progress     Patient Stated   . PharmD "I can't afford my insulin" (pt-stated)       CARE PLAN ENTRY (see longtitudinal plan of care for additional care plan information)  Current Barriers:  . Diabetes: uncontrolled; most recent A1c 10.8% o Patient calls today with questions: wonders if anyone is going to call her about getting a shower chair; notes that the [place] sensors are not covered at the local pharmacy; and wants to let me know that the Trulicity 1.5 mg pen strength is to be delivered on 01/10/20 per [employer] . Current antihyperglycemic regimen: Humulin R U500 65 units TID; Trulicity A999333 mg once weekly  o Previously approved for Humulin U500 assistance 08/21/2019-08/20/2020 . Cardiovascular risk reduction: o Current hypertensive regimen: ramipril 2.5 mg daily o Current hyperlipidemia regimen: simvastatin 40 mg daily  Pharmacist Clinical Goal(s):  [name] Over the next 90 days, patient with work with PharmD and primary care provider to address optimized glycemic regimen  Interventions: . Contacted patient. Reviewed that RN CM called and left message on 12/25/19. Will message RN CM to ask to call patient at her convenience. Provided RN CM phone number to patient so that she will answer the phone.  . Encouraged to contact Dr. Joycie Peek office to inform that FreeStyle [place] prescription needs to go to DME supplier, as patient has [employer]. Patient verbalizes understanding.  Patient Self Care Activities:  . Patient will check blood glucose BID , document, and provide at future appointments . Patient will take medications as prescribed . Patient will report any questions or concerns to provider   Please see past updates related to this goal by clicking on the "Past Updates" button in the selected goal         Patient verbalizes understanding of instructions provided today.   Plan:  - Will outreach as previously  scheduled  [name], PharmD, [place] [phone]

## 2020-01-08 NOTE — Chronic Care Management (AMB) (Signed)
Chronic Care Management   Follow Up Note   01/08/2020 Name: Jessica Montoya MRN: [id number] DOB: 04/17/1951  Referred by: [name], NP Reason for referral : Chronic Care Management (Medication Management)   Jessica Montoya is a 69 y.o. year old female who is a primary care patient of [name], [name], NP. The CCM team was consulted for assistance with chronic disease management and care coordination needs.    Received call from patient today with medication questions.   Review of patient status, including review of consultants reports, relevant laboratory and other test results, and collaboration with appropriate care team members and the patient's provider was performed as part of comprehensive patient evaluation and provision of chronic care management services.    SDOH (Social Determinants of Health) assessments performed: No See Care Plan activities for detailed interventions related to [facility])     Outpatient Encounter Medications as of 01/08/2020  Medication Sig Note  . aspirin 81 MG tablet Take 81 mg by mouth daily.   . Cholecalciferol (VITAMIN D PO) Take 2,000 Units by mouth daily.    . Dulaglutide 0.75 MG/0.5ML SOPN Inject into the skin. 01/07/2020: 1.5 mg ordered, but has not arrived for patient to start yet  . ferrous fumarate (HEMOCYTE - 106 MG FE) 325 (106 FE) MG TABS tablet Take 1 tablet by mouth daily.   [name] FLUoxetine (PROZAC) 40 MG capsule Take 1 capsule (40 mg total) by mouth daily.   [name] gabapentin (NEURONTIN) 300 MG capsule Take 1 capsule (300 mg total) by mouth 2 (two) times daily. (Patient taking differently: Take 300 mg by mouth daily. )   . HUMULIN R U-500 KWIKPEN 500 UNIT/ML kwikpen Inject 70 Units into the skin 3 (three) times daily with meals.   . hydrOXYzine (VISTARIL) 25 MG capsule TAKE 1 CAPSULE (25 MG TOTAL) BY MOUTH 2 (TWO) TIMES DAILY AS NEEDED (DIZZINESS).   . Multiple Vitamin (MULTIVITAMIN) tablet Take 1 tablet by mouth daily.   [name] omeprazole  (PRILOSEC) 20 MG capsule Take 1 capsule (20 mg total) by mouth daily.   . ramipril (ALTACE) 2.5 MG capsule Take 1 capsule (2.5 mg total) by mouth daily.   . simvastatin (ZOCOR) 40 MG tablet Take 1 tablet (40 mg total) by mouth daily.   . traZODone (DESYREL) 50 MG tablet Take 1 tablet (50 mg total) by mouth at bedtime.   . vitamin B-12 (CYANOCOBALAMIN) 1000 MCG tablet Take 2,000 mcg by mouth daily.    No facility-administered encounter medications on file as of 01/08/2020.     Objective:   Goals Addressed            This Visit's Progress     Patient Stated   . PharmD "I can't afford my insulin" (pt-stated)       CARE PLAN ENTRY (see longtitudinal plan of care for additional care plan information)  Current Barriers:  . Diabetes: uncontrolled; most recent A1c 10.8% o Patient calls today with questions: wonders if anyone is going to call her about getting a shower chair; notes that the [place] sensors are not covered at the local pharmacy; and wants to let me know that the Trulicity 1.5 mg pen strength is to be delivered on 01/10/20 per [employer] . Current antihyperglycemic regimen: Humulin R U500 65 units TID; Trulicity A999333 mg once weekly  o Previously approved for Humulin U500 assistance 08/21/2019-08/20/2020 . Cardiovascular risk reduction: o Current hypertensive regimen: ramipril 2.5 mg daily o Current hyperlipidemia regimen: simvastatin 40 mg daily  Pharmacist Clinical Goal(s):  [name] Over the next 90 days, patient with work with PharmD and primary care provider to address optimized glycemic regimen  Interventions: . Contacted patient. Reviewed that RN CM called and left message on 12/25/19. Will message RN CM to ask to call patient at her convenience. Provided RN CM phone number to patient so that she will answer the phone.  . Encouraged to contact Dr. Joycie Peek office to inform that FreeStyle [place] prescription needs to go to DME supplier, as patient has [employer]. Patient verbalizes  understanding.  Patient Self Care Activities:  . Patient will check blood glucose BID , document, and provide at future appointments . Patient will take medications as prescribed . Patient will report any questions or concerns to provider   Please see past updates related to this goal by clicking on the "Past Updates" button in the selected goal          Plan:  - Will outreach as previously scheduled  [name], PharmD, [place] [phone]

## 2020-01-10 ENCOUNTER — Telehealth: Payer: Self-pay | Admitting: Nurse Practitioner

## 2020-01-10 NOTE — Chronic Care Management (AMB) (Signed)
  Care Management   Note  01/10/2020 Name: Luretta Gammel MRN: [id number] DOB: [birth year]  Lynnex Smelcer is a 69 y.o. year old female who is a primary care patient of [name], NP and is actively engaged with the care management team. I reached out to Huey Bienenstock by phone today to assist with re-scheduling an initial visit with the RN Case Manager  Follow up plan: Telephone appointment with care management team member scheduled for: 02/22/2020.  [place], [place] [address] Direct Dial: [phone] [email or web address]: [email or web address]

## 2020-01-16 ENCOUNTER — Ambulatory Visit: Payer: Self-pay | Admitting: General Practice

## 2020-01-16 ENCOUNTER — Telehealth: Payer: Medicare Other

## 2020-01-16 DIAGNOSIS — E1165 Type 2 diabetes mellitus with hyperglycemia: Secondary | ICD-10-CM

## 2020-01-16 DIAGNOSIS — E1169 Type 2 diabetes mellitus with other specified complication: Secondary | ICD-10-CM

## 2020-01-16 DIAGNOSIS — E1159 Type 2 diabetes mellitus with other circulatory complications: Secondary | ICD-10-CM

## 2020-01-16 DIAGNOSIS — F331 Major depressive disorder, recurrent, moderate: Secondary | ICD-10-CM

## 2020-01-16 DIAGNOSIS — E785 Hyperlipidemia, unspecified: Secondary | ICD-10-CM | POA: Diagnosis not present

## 2020-01-16 DIAGNOSIS — E1122 Type 2 diabetes mellitus with diabetic chronic kidney disease: Secondary | ICD-10-CM

## 2020-01-16 DIAGNOSIS — I10 Essential (primary) hypertension: Secondary | ICD-10-CM | POA: Diagnosis not present

## 2020-01-16 DIAGNOSIS — IMO0002 Reserved for concepts with insufficient information to code with codable children: Secondary | ICD-10-CM

## 2020-01-16 DIAGNOSIS — I152 Hypertension secondary to endocrine disorders: Secondary | ICD-10-CM

## 2020-01-16 NOTE — Patient Instructions (Signed)
Visit Information  Goals Addressed            This Visit's Progress   . RNCM: "I am trying to get my sugar under control" (pt-stated)       CARE PLAN ENTRY (see longtitudinal plan of care for additional care plan information)  Current Barriers:  . Chronic Disease Management support, education, and care coordination needs related to HTN, HLD, DMII, and Depression  Clinical Goal(s) related to HTN, HLD, DMII, and Depression:  Over the next 120 days, patient will:  . Work with the care management team to address educational, disease management, and care coordination needs  . Begin or continue self health monitoring activities as directed today Measure and record cbg (blood glucose) 3 times daily, Measure and record blood pressure 2 times per week, and adhere to a heart healthy/ADA diet  . Call provider office for new or worsened signs and symptoms Blood glucose findings outside established parameters, Blood pressure findings outside established parameters, Shortness of breath, and New or worsened symptom related to depression or other chronic conditions  . Call care management team with questions or concerns . Verbalize basic understanding of patient centered plan of care established today  Interventions related to HTN, HLD, DMII, and Depression:  . Evaluation of current treatment plans and patient's adherence to plan as established by provider.  The patient sees an Endocrinologist and pcp.  She says they are working with her to get her blood sugars regulated. She is on Trulicity now and sees a big improvement. Has experienced some lows and has reported these to the provider.  Evaluation of eating a bedtime snack and monitoring for low blood sugars. The patient confirmed she is eating a bedtime snack. Fast blood sugars are 186 to 200 but sometimes she drops during the day and night to 60's.   . Assessed patient understanding of disease states.  The patient verbalized understanding of chronic  conditions. Takes her blood pressures sometimes and it is usually good. Does experience some light headedness at times.  Discussed with patient the possibility of having her blood pressure drop when changing position. The patient stated they checked orthostatic blood pressures when she was in the hospital. She says sometimes when she checks it it is a little low. Education and support given.  . Assessed patient's education and care coordination needs.  The patient verbalized she needs a shower chair for safety when taking a shower. Ask the patient about the OTC benefit with [facility]. Instructed the patient to call customer service to see if she has this benefit. She will do this. Instructed the patient if she did not have the OTC benefit to ask the insurance company what they would pay toward a shower chair. The patient will let the CM know what her findings are.  . Provided disease specific education to patient.  Education on following a low sodium/ADA diet  . Collaborated with appropriate clinical care team members regarding patient needs.  The patient is currently working with pharmacist and knows of LCSW availability. She feels her depression is well controlled at this time due to the changes in medication.  The patient denies any immediate needs.  . Assessed fall risk. The patient walks with a cane.  Reviewed using good fitting shoes, slipper socks, and avoiding throw rugs and cords. The patient verbalized understanding.  The patient states she has not had a fall x 2 months.   Patient Self Care Activities related to HTN, HLD, DMII, and Depression:  .  Patient is unable to independently self-manage chronic health conditions  Initial goal documentation        Ms. Gibbard was given information about Chronic Care Management services today including:  1. CCM service includes personalized support from designated clinical staff supervised by her physician, including individualized plan of care and coordination  with other care providers 2. 24/7 contact phone numbers for assistance for urgent and routine care needs. 3. Service will only be billed when office clinical staff spend 20 minutes or more in a month to coordinate care. 4. Only one practitioner may furnish and bill the service in a calendar month. 5. The patient may stop CCM services at any time (effective at the end of the month) by phone call to the office staff. 6. The patient will be responsible for cost sharing (co-pay) of up to 20% of the service fee (after annual deductible is met).  Patient agreed to services and verbal consent obtained.   Patient verbalizes understanding of instructions provided today.   The care management team will reach out to the patient again over the next 30 to 60 days.   Noreene Larsson RN, MSN, [facility] Mobile: [phone]

## 2020-01-16 NOTE — Chronic Care Management (AMB) (Signed)
Chronic Care Management   Initial Visit Note  01/16/2020 Name: Jessica Montoya MRN: [id number] DOB: July 25, 1951  Referred by: [name], NP Reason for referral : Chronic Care Management (RNCM: Chronic Disease Management and Care Coordination Needs)   Jessica Montoya is a 69 y.o. year old female who is a primary care patient of [name], [name], NP. The CCM team was consulted for assistance with chronic disease management and care coordination needs related to HTN, HLD, DMII and Depression  Review of patient status, including review of consultants reports, relevant laboratory and other test results, and collaboration with appropriate care team members and the patient's provider was performed as part of comprehensive patient evaluation and provision of chronic care management services.    SDOH (Social Determinants of Health) assessments performed: Yes See Care Plan activities for detailed interventions related to SDOH  SDOH Interventions     Most Recent Value  SDOH Interventions  SDOH Interventions for the Following Domains  Social Connections, Physical Activity  Physical Activity Interventions  Other (Comments) [the patient walks with a cane and has balance issues, limited with type of activity she can do]  Social Connections Interventions  Other (Comment) [has a good support system. Denies any concerns related to socialization]       Medications: Outpatient Encounter Medications as of 01/16/2020  Medication Sig Note  . aspirin 81 MG tablet Take 81 mg by mouth daily.   . Cholecalciferol (VITAMIN D PO) Take 2,000 Units by mouth daily.    . Dulaglutide 0.75 MG/0.5ML SOPN Inject into the skin. 01/07/2020: 1.5 mg ordered, but has not arrived for patient to start yet  . ferrous fumarate (HEMOCYTE - 106 MG FE) 325 (106 FE) MG TABS tablet Take 1 tablet by mouth daily.   [name] FLUoxetine (PROZAC) 40 MG capsule Take 1 capsule (40 mg total) by mouth daily.   [name] gabapentin (NEURONTIN)  300 MG capsule Take 1 capsule (300 mg total) by mouth 2 (two) times daily. (Patient taking differently: Take 300 mg by mouth daily. )   . HUMULIN R U-500 KWIKPEN 500 UNIT/ML kwikpen Inject 70 Units into the skin 3 (three) times daily with meals.   . hydrOXYzine (VISTARIL) 25 MG capsule TAKE 1 CAPSULE (25 MG TOTAL) BY MOUTH 2 (TWO) TIMES DAILY AS NEEDED (DIZZINESS).   . Multiple Vitamin (MULTIVITAMIN) tablet Take 1 tablet by mouth daily.   [name] omeprazole (PRILOSEC) 20 MG capsule Take 1 capsule (20 mg total) by mouth daily.   . ramipril (ALTACE) 2.5 MG capsule Take 1 capsule (2.5 mg total) by mouth daily.   . simvastatin (ZOCOR) 40 MG tablet Take 1 tablet (40 mg total) by mouth daily.   . traZODone (DESYREL) 50 MG tablet Take 1 tablet (50 mg total) by mouth at bedtime.   . vitamin B-12 (CYANOCOBALAMIN) 1000 MCG tablet Take 2,000 mcg by mouth daily.    No facility-administered encounter medications on file as of 01/16/2020.     Objective:  BP Readings from Last 3 Encounters:  12/12/19 (!) 149/72  12/10/19 120/81  11/28/19 108/61    Goals Addressed            This Visit's Progress   . RNCM: "I am trying to get my sugar under control" (pt-stated)       CARE PLAN ENTRY (see longtitudinal plan of care for additional care plan information)  Current Barriers:  . Chronic Disease Management support, education, and care coordination needs related to HTN, HLD, DMII, and  Depression  Clinical Goal(s) related to HTN, HLD, DMII, and Depression:  Over the next 120 days, patient will:  . Work with the care management team to address educational, disease management, and care coordination needs  . Begin or continue self health monitoring activities as directed today Measure and record cbg (blood glucose) 3 times daily, Measure and record blood pressure 2 times per week, and adhere to a heart healthy/ADA diet  . Call provider office for new or worsened signs and symptoms Blood glucose findings outside  established parameters, Blood pressure findings outside established parameters, Shortness of breath, and New or worsened symptom related to depression or other chronic conditions  . Call care management team with questions or concerns . Verbalize basic understanding of patient centered plan of care established today  Interventions related to HTN, HLD, DMII, and Depression:  . Evaluation of current treatment plans and patient's adherence to plan as established by provider.  The patient sees an Endocrinologist and pcp.  She says they are working with her to get her blood sugars regulated. She is on Trulicity now and sees a big improvement. Has experienced some lows and has reported these to the provider.  Evaluation of eating a bedtime snack and monitoring for low blood sugars. The patient confirmed she is eating a bedtime snack. Fast blood sugars are 186 to 200 but sometimes she drops during the day and night to 60's.   . Assessed patient understanding of disease states.  The patient verbalized understanding of chronic conditions. Takes her blood pressures sometimes and it is usually good. Does experience some light headedness at times.  Discussed with patient the possibility of having her blood pressure drop when changing position. The patient stated they checked orthostatic blood pressures when she was in the hospital. She says sometimes when she checks it it is a little low. Education and support given.  . Assessed patient's education and care coordination needs.  The patient verbalized she needs a shower chair for safety when taking a shower. Ask the patient about the OTC benefit with [facility]. Instructed the patient to call customer service to see if she has this benefit. She will do this. Instructed the patient if she did not have the OTC benefit to ask the insurance company what they would pay toward a shower chair. The patient will let the CM know what her findings are.  . Provided disease specific  education to patient.  Education on following a low sodium/ADA diet  . Collaborated with appropriate clinical care team members regarding patient needs.  The patient is currently working with pharmacist and knows of LCSW availability. She feels her depression is well controlled at this time due to the changes in medication.  The patient denies any immediate needs.  . Assessed fall risk. The patient walks with a cane.  Reviewed using good fitting shoes, slipper socks, and avoiding throw rugs and cords. The patient verbalized understanding.  The patient states she has not had a fall x 2 months.   Patient Self Care Activities related to HTN, HLD, DMII, and Depression:  . Patient is unable to independently self-manage chronic health conditions  Initial goal documentation         Ms. Enwright was given information about Chronic Care Management services today including:  1. CCM service includes personalized support from designated clinical staff supervised by her physician, including individualized plan of care and coordination with other care providers 2. 24/7 contact phone numbers for assistance for urgent and routine  care needs. 3. Service will only be billed when office clinical staff spend 20 minutes or more in a month to coordinate care. 4. Only one practitioner may furnish and bill the service in a calendar month. 5. The patient may stop CCM services at any time (effective at the end of the month) by phone call to the office staff. 6. The patient will be responsible for cost sharing (co-pay) of up to 20% of the service fee (after annual deductible is met).  Patient agreed to services and verbal consent obtained.   Plan:   The care management team will reach out to the patient again over the next 30 to 60 days.   Noreene Larsson RN, MSN, [facility] Mobile: [phone]

## 2020-01-18 DIAGNOSIS — Z794 Long term (current) use of insulin: Secondary | ICD-10-CM | POA: Diagnosis not present

## 2020-01-18 DIAGNOSIS — E11649 Type 2 diabetes mellitus with hypoglycemia without coma: Secondary | ICD-10-CM | POA: Diagnosis not present

## 2020-01-18 DIAGNOSIS — E1129 Type 2 diabetes mellitus with other diabetic kidney complication: Secondary | ICD-10-CM | POA: Diagnosis not present

## 2020-01-18 DIAGNOSIS — E1142 Type 2 diabetes mellitus with diabetic polyneuropathy: Secondary | ICD-10-CM | POA: Diagnosis not present

## 2020-01-28 ENCOUNTER — Ambulatory Visit: Payer: Medicare Other | Admitting: Gastroenterology

## 2020-01-28 ENCOUNTER — Ambulatory Visit (INDEPENDENT_AMBULATORY_CARE_PROVIDER_SITE_OTHER): Payer: Medicare Other | Admitting: Nurse Practitioner

## 2020-01-28 ENCOUNTER — Other Ambulatory Visit: Payer: Self-pay

## 2020-01-28 ENCOUNTER — Encounter: Payer: Self-pay | Admitting: Nurse Practitioner

## 2020-01-28 VITALS — BP 110/62 | HR 87 | Temp 97.4°F | Wt 165.6 lb

## 2020-01-28 DIAGNOSIS — IMO0002 Reserved for concepts with insufficient information to code with codable children: Secondary | ICD-10-CM

## 2020-01-28 DIAGNOSIS — I10 Essential (primary) hypertension: Secondary | ICD-10-CM

## 2020-01-28 DIAGNOSIS — E1122 Type 2 diabetes mellitus with diabetic chronic kidney disease: Secondary | ICD-10-CM

## 2020-01-28 DIAGNOSIS — E1169 Type 2 diabetes mellitus with other specified complication: Secondary | ICD-10-CM

## 2020-01-28 DIAGNOSIS — F331 Major depressive disorder, recurrent, moderate: Secondary | ICD-10-CM

## 2020-01-28 DIAGNOSIS — E1159 Type 2 diabetes mellitus with other circulatory complications: Secondary | ICD-10-CM

## 2020-01-28 DIAGNOSIS — E1165 Type 2 diabetes mellitus with hyperglycemia: Secondary | ICD-10-CM

## 2020-01-28 DIAGNOSIS — Z85038 Personal history of other malignant neoplasm of large intestine: Secondary | ICD-10-CM | POA: Diagnosis not present

## 2020-01-28 DIAGNOSIS — E785 Hyperlipidemia, unspecified: Secondary | ICD-10-CM

## 2020-01-28 NOTE — Assessment & Plan Note (Signed)
Chronic, ongoing.  Continue current medication regimen and adjust as needed.  Obtain lipid panel next visit.  Return in 3 months.

## 2020-01-28 NOTE — Assessment & Plan Note (Signed)
Chronic, stable with BP below goal.  Continue Altace for kidney protection.  Recommend she continue to monitor BP at home daily and document.  BMP today. May consider discontinuation of Ramipril if lower BP readings noted consistently.  Return in 3 months.

## 2020-01-28 NOTE — Assessment & Plan Note (Signed)
Is scheduled for colonoscopy May 4th, will review notes after completed.  ? cause of weakness and fatigue that is ongoing.  She is maintaining weight at this time.  Discussed with patient that if this is clear, next steps would be cardiac assessment.

## 2020-01-28 NOTE — Patient Instructions (Signed)

## 2020-01-28 NOTE — Progress Notes (Signed)
BP 110/62 (BP Location: Left Arm)   Pulse 87   Temp (!) 97.4 F (36.3 C) (Oral)   Wt 165 lb 9.6 oz (75.1 kg)   LMP  (LMP Unknown)   SpO2 90%   BMI 26.73 kg/m    Subjective:    Patient ID: Jessica Montoya, female    DOB: [birth year], 69 y.o.   MRN: [id number]  HPI: [name] is a 69 y.o. female  Chief Complaint  Patient presents with  . Depression  . Diabetes  . Hyperlipidemia  . Hypertension   ANXIETY/STRESS At this time takes Prozac 40 MG (increased last visit) and Trazodone 50 MG daily.  Reports improvement with increase in Prozac.   Duration:stable Anxious mood: yes Excessive worrying: yes Irritability: yes  Sweating: no Nausea: yes Palpitations:no Hyperventilation: no Panic attacks: yes Agoraphobia: no  Obscessions/compulsions: no Depressed mood: yes Depression screen [facility] 2/9 01/28/2020 01/16/2020 12/10/2019 11/28/2019 10/30/2019  Decreased Interest 1 0 0 0 1  Down, Depressed, Hopeless 1 0 2 1 0  PHQ - 2 Score 2 0 2 1 1   Altered sleeping 2 - 3 - 3  Tired, decreased energy 3 - 3 - 3  Change in appetite 1 - 0 - 0  Feeling bad or failure about yourself  0 - 0 - 0  Trouble concentrating 0 - 0 - 1  Moving slowly or fidgety/restless 0 - 0 - 0  Suicidal thoughts 0 - 0 - 0  PHQ-9 Score 8 - 8 - 8  Difficult doing work/chores Not difficult at all - Somewhat difficult - Not difficult at all  Some recent data might be hidden   Anhedonia: no Weight changes: no Insomnia: yes hard to fall asleep  Hypersomnia: no Fatigue/loss of energy: yes Feelings of worthlessness: no Feelings of guilt: no Impaired concentration/indecisiveness: yes Suicidal ideations: no  Crying spells: no Recent Stressors/Life Changes: no   Relationship problems: no   Family stress: no     Financial stress: no    Job stress: no    Recent death/loss: no GAD 7 : Generalized Anxiety Score 01/28/2020 12/10/2019 04/17/2019  Nervous, Anxious, on Edge 1 3 1   Control/stop worrying 1 3 1     Worry too much - different things 1 3 1   Trouble relaxing 1 1 1   Restless 1 1 0  Easily annoyed or irritable 1 1 0  Afraid - awful might happen 1 0 0  Total GAD 7 Score 7 12 4   Anxiety Difficulty Not difficult at all Somewhat difficult Not difficult at all    DIABETES Is followed by Dr. [name], last saw her [id number], Trulicity was started.  Taking U500 70 units TID, she recently had this decreased to 50-50-30 due to lows with Trulicity.  Has tried Metformin (GI upset), only oral medication she has tried (it is on last endo note as continuing to take, but patient reports not taking this ).  A1C in March 10.8%  Is being followed by CCM team for assistance with insulin.  Returns to see Dr. [name] in two weeks. Average glucose on Free Style == 141.   Hypoglycemic episodes: lows in middle of night until 5 am Polydipsia/polyuria: no Visual disturbance: no Chest pain: no Paresthesias: no Glucose Monitoring: yes             Accucheck frequency: TID             Fasting glucose: 150 - 240  Post prandial: at 2 pm averages 160-170             Evening: at 8 pm ranges 250             Before meals:  Taking Insulin?: yes             Long acting insulin: U 500 50-50-30 units             Short acting insulin: Blood Pressure Monitoring: rarely Retinal Examination: Not up to Date Foot Exam: Up to Date Pneumovax: Up to Date Influenza: Up to Date Aspirin: yes   HYPERTENSION / HYPERLIPIDEMIA Continues on Ramipril and Simvastatin.  Is seeing cardiologist Dr. Fletcher Anon.  She continues to complain of weakness and fatigue which presented prior to Covid, reports feeling a lot like when she had colon CA in past -- is scheduled for colonoscopy Feb 05, 2020.  She reports weakness and fatigue became worse after Covid.  Denies SOB, CP, blood in stool, diarrhea, constipation, appetite changes, weight loss (has had gain).  Reports chronic left quadrant pain since her past surgeries.   Satisfied with current  treatment? yes Duration of hypertension: chronic BP monitoring frequency: rarely BP range: she can not remember BP medication side effects: no Duration of hyperlipidemia: chronic Cholesterol medication side effects: no Cholesterol supplements: none Medication compliance: good compliance Aspirin: no Recent stressors: no Recurrent headaches: no Visual changes: no Palpitations: no Dyspnea: no Chest pain: no Lower extremity edema: no Dizzy/lightheaded: no   Relevant past medical, surgical, family and social history reviewed and updated as indicated. Interim medical history since our last visit reviewed. Allergies and medications reviewed and updated.  Review of Systems  Constitutional: Negative for activity change, appetite change, diaphoresis, fatigue and fever.  Respiratory: Negative for cough, chest tightness and shortness of breath.   Cardiovascular: Negative for chest pain, palpitations and leg swelling.  Gastrointestinal: Negative.   Endocrine: Negative for cold intolerance, heat intolerance, polydipsia, polyphagia and polyuria.  Neurological: Negative.   Psychiatric/Behavioral: Negative for decreased concentration, self-injury, sleep disturbance and suicidal ideas. The patient is not nervous/anxious.     Per HPI unless specifically indicated above     Objective:    BP 110/62 (BP Location: Left Arm)   Pulse 87   Temp (!) 97.4 F (36.3 C) (Oral)   Wt 165 lb 9.6 oz (75.1 kg)   LMP  (LMP Unknown)   SpO2 90%   BMI 26.73 kg/m   Wt Readings from Last 3 Encounters:  01/28/20 165 lb 9.6 oz (75.1 kg)  12/11/19 152 lb 6.4 oz (69.1 kg)  11/28/19 150 lb (68 kg)    Physical Exam Vitals and nursing note reviewed.  Constitutional:      General: She is awake. She is not in acute distress.    Appearance: She is well-developed and well-groomed. She is not ill-appearing.  HENT:     Head: Normocephalic.     Right Ear: Hearing, tympanic membrane, ear canal and external ear  normal.     Left Ear: Hearing, tympanic membrane, ear canal and external ear normal.  Eyes:     General: Lids are normal.        Right eye: No discharge.        Left eye: No discharge.     Conjunctiva/sclera: Conjunctivae normal.     Pupils: Pupils are equal, round, and reactive to light.  Neck:     Thyroid: No thyromegaly.     Vascular: No carotid bruit.  Cardiovascular:  Rate and Rhythm: Normal rate and regular rhythm.     Heart sounds: Normal heart sounds. No murmur. No gallop.   Pulmonary:     Effort: Pulmonary effort is normal. No accessory muscle usage or respiratory distress.     Breath sounds: Normal breath sounds.  Abdominal:     General: Bowel sounds are normal.     Palpations: Abdomen is soft.     Tenderness: There is no abdominal tenderness.  Musculoskeletal:     Cervical back: Normal range of motion and neck supple.     Right lower leg: No edema.     Left lower leg: No edema.  Skin:    General: Skin is warm and dry.  Neurological:     Mental Status: She is alert and oriented to person, place, and time.     Cranial Nerves: Cranial nerves are intact.     Coordination: Coordination is intact.     Gait: Gait is intact.     Deep Tendon Reflexes: Reflexes are normal and symmetric.     Reflex Scores:      Brachioradialis reflexes are 2+ on the right side and 2+ on the left side.      Patellar reflexes are 2+ on the right side and 2+ on the left side.    Comments: Uses cane to assist gait.  Psychiatric:        Attention and Perception: Attention normal.        Mood and Affect: Mood normal.        Speech: Speech normal.        Behavior: Behavior normal. Behavior is cooperative.     Results for orders placed or performed during the hospital encounter of 12/11/19  Urine culture   Specimen: Urine, Clean Catch  Result Value Ref Range   Specimen Description      URINE, CLEAN CATCH Performed at [facility], [address] [place], [place] [address]     Special Requests      NONE Performed at [facility], [address] [place],  [address]    Culture MULTIPLE SPECIES PRESENT, SUGGEST RECOLLECTION (A)    Report Status 12/12/2019 FINAL   Basic metabolic panel  Result Value Ref Range   Sodium 136 135 - 145 mmol/L   Potassium 3.2 (L) 3.5 - 5.1 mmol/L   Chloride 97 (L) 98 - 111 mmol/L   CO2 24 22 - 32 mmol/L   Glucose, Bld 139 (H) 70 - 99 mg/dL   BUN 23 8 - 23 mg/dL   Creatinine, Ser 1.66 (H) 0.44 - 1.00 mg/dL   Calcium 9.7 8.9 - 10.3 mg/dL   GFR calc non Af Amer 31 (L) >60 mL/min   GFR calc Af Amer 36 (L) >60 mL/min   Anion gap 15 5 - 15  CBC  Result Value Ref Range   WBC 11.3 (H) 4.0 - 10.5 K/uL   RBC 4.37 3.87 - 5.11 MIL/uL   Hemoglobin 13.2 12.0 - 15.0 g/dL   HCT 39.5 36.0 - 46.0 %   MCV 90.4 80.0 - 100.0 fL   MCH 30.2 26.0 - 34.0 pg   MCHC 33.4 30.0 - 36.0 g/dL   RDW 12.9 11.5 - 15.5 %   Platelets 209 150 - 400 K/uL   nRBC 0.0 0.0 - 0.2 %  Urinalysis, Complete w Microscopic  Result Value Ref Range   Color, Urine YELLOW (A) YELLOW   APPearance HAZY (A) CLEAR   Specific Gravity, Urine  1.020 1.005 - 1.030   pH 5.0 5.0 - 8.0   Glucose, UA >=500 (A) NEGATIVE mg/dL   Hgb urine dipstick SMALL (A) NEGATIVE   Bilirubin Urine NEGATIVE NEGATIVE   Ketones, ur NEGATIVE NEGATIVE mg/dL   Protein, ur 30 (A) NEGATIVE mg/dL   Nitrite NEGATIVE NEGATIVE   Leukocytes,Ua LARGE (A) NEGATIVE   RBC / HPF 11-20 0 - 5 RBC/hpf   WBC, UA >50 (H) 0 - 5 WBC/hpf   Bacteria, UA RARE (A) NONE SEEN   Squamous Epithelial / LPF 6-10 0 - 5   Mucus PRESENT    Hyaline Casts, UA PRESENT   Glucose, capillary  Result Value Ref Range   Glucose-Capillary 155 (H) 70 - 99 mg/dL  Lactic acid, plasma  Result Value Ref Range   Lactic Acid, Venous 1.8 0.5 - 1.9 mmol/L  Glucose, capillary  Result Value Ref Range   Glucose-Capillary 100 (H) 70 - 99 mg/dL  Magnesium  Result Value Ref Range   Magnesium 1.9 1.7 - 2.4 mg/dL  Phosphorus    Result Value Ref Range   Phosphorus 3.7 2.5 - 4.6 mg/dL  CK  Result Value Ref Range   Total CK 72 38 - 234 U/L  Creatinine, urine, random  Result Value Ref Range   Creatinine, Urine 81 mg/dL  Sodium, urine, random  Result Value Ref Range   Sodium, Ur 28 mmol/L  Magnesium  Result Value Ref Range   Magnesium 1.8 1.7 - 2.4 mg/dL  Phosphorus  Result Value Ref Range   Phosphorus 3.2 2.5 - 4.6 mg/dL  TSH  Result Value Ref Range   TSH 1.413 0.350 - 4.500 uIU/mL  CBC  Result Value Ref Range   WBC 4.9 4.0 - 10.5 K/uL   RBC 3.54 (L) 3.87 - 5.11 MIL/uL   Hemoglobin 11.2 (L) 12.0 - 15.0 g/dL   HCT 32.3 (L) 36.0 - 46.0 %   MCV 91.2 80.0 - 100.0 fL   MCH 31.6 26.0 - 34.0 pg   MCHC 34.7 30.0 - 36.0 g/dL   RDW 12.7 11.5 - 15.5 %   Platelets 134 (L) 150 - 400 K/uL   nRBC 0.0 0.0 - 0.2 %  HIV Antibody (routine testing w rflx)  Result Value Ref Range   HIV Screen 4th Generation wRfx NON REACTIVE NON REACTIVE  Hemoglobin A1c  Result Value Ref Range   Hgb A1c MFr Bld 10.8 (H) 4.8 - 5.6 %   Mean Plasma Glucose 263.26 mg/dL  Glucose, capillary  Result Value Ref Range   Glucose-Capillary 240 (H) 70 - 99 mg/dL  Glucose, capillary  Result Value Ref Range   Glucose-Capillary 233 (H) 70 - 99 mg/dL   Comment 1 Notify RN   Glucose, capillary  Result Value Ref Range   Glucose-Capillary 258 (H) 70 - 99 mg/dL  Glucose, capillary  Result Value Ref Range   Glucose-Capillary 259 (H) 70 - 99 mg/dL  Potassium  Result Value Ref Range   Potassium 3.6 3.5 - 5.1 mmol/L  Glucose, capillary  Result Value Ref Range   Glucose-Capillary 276 (H) 70 - 99 mg/dL      Assessment & Plan:   Problem List Items Addressed This Visit      Cardiovascular and Mediastinum   Hypertension associated with diabetes ([place])    Chronic, stable with BP below goal.  Continue Altace for kidney protection.  Recommend she continue to monitor BP at home daily and document.  BMP today. May consider discontinuation of  Ramipril  if lower BP readings noted consistently.  Return in 3 months.      Relevant Orders   Basic metabolic panel     Endocrine   Hyperlipidemia associated with type 2 diabetes mellitus ([facility])    Chronic, ongoing.  Continue current medication regimen and adjust as needed.  Obtain lipid panel next visit.  Return in 3 months.      Uncontrolled type 2 diabetes mellitus with chronic kidney disease ([place]) - Primary    Chronic, ongoing, followed by endo.  A1C with 10.8% recently. Continue current regimen at this time, defer changes to endo, and collaboration with endo + CCM collaboration.   Recent urine micro ALB 30 and A:C 30-300.  Recheck BMP today and recommend if consistent lows to ensure she contacts endo of PCP.  Return in 3 months.        Other   Depression    Chronic, ongoing. Scores improved this visit GAD 7 and PHQ9 8 today.   Will continue Prozac 40 MG and continue Trazodone 50 MG every night.  Denies SI/HI. Return in 3 months.  Adjust medication as needed and monitor NA+ level.      History of colon cancer    Is scheduled for colonoscopy May 4th, will review notes after completed.  ? cause of weakness and fatigue that is ongoing.  She is maintaining weight at this time.  Discussed with patient that if this is clear, next steps would be cardiac assessment.          Follow up plan: Return in about 6 weeks (around 03/10/2020) for Fatigue.

## 2020-01-28 NOTE — Assessment & Plan Note (Signed)
Chronic, ongoing, followed by endo.  A1C with 10.8% recently. Continue current regimen at this time, defer changes to endo, and collaboration with endo + CCM collaboration.   Recent urine micro ALB 30 and A:C 30-300.  Recheck BMP today and recommend if consistent lows to ensure she contacts endo of PCP.  Return in 3 months.

## 2020-01-28 NOTE — Assessment & Plan Note (Signed)
Chronic, ongoing. Scores improved this visit GAD 7 and PHQ9 8 today.   Will continue Prozac 40 MG and continue Trazodone 50 MG every night.  Denies SI/HI. Return in 3 months.  Adjust medication as needed and monitor NA+ level.

## 2020-01-29 LAB — BASIC METABOLIC PANEL
BUN/Creatinine Ratio: 14 (ref 12–28)
BUN: 18 mg/dL (ref 8–27)
CO2: 23 mmol/L (ref 20–29)
Calcium: 9.4 mg/dL (ref 8.7–10.3)
Chloride: 102 mmol/L (ref 96–106)
Creatinine, Ser: 1.27 mg/dL — ABNORMAL HIGH (ref 0.57–1.00)
GFR calc Af Amer: 50 mL/min/{1.73_m2} — ABNORMAL LOW (ref >59)
GFR calc non Af Amer: 43 mL/min/{1.73_m2} — ABNORMAL LOW (ref >59)
Glucose: 190 mg/dL — ABNORMAL HIGH (ref 65–99)
Potassium: 5.1 mmol/L (ref 3.5–5.2)
Sodium: 138 mmol/L (ref 134–144)

## 2020-01-29 NOTE — Progress Notes (Signed)
Please let Jessica Montoya know her labs continue to show some kidney disease, although some improvement from last labs.  Potassium level and sodium are normal.  No changes needed at this time.  Have a great day!!

## 2020-02-01 ENCOUNTER — Other Ambulatory Visit
Admission: RE | Admit: 2020-02-01 | Discharge: 2020-02-01 | Disposition: A | Discharge status: Home / Self Care | Payer: Medicare Other | Source: Ambulatory Visit | Attending: Gastroenterology | Admitting: Gastroenterology

## 2020-02-01 ENCOUNTER — Other Ambulatory Visit: Payer: Self-pay

## 2020-02-01 DIAGNOSIS — Z20822 Contact with and (suspected) exposure to covid-19: Secondary | ICD-10-CM | POA: Insufficient documentation

## 2020-02-01 DIAGNOSIS — Z01812 Encounter for preprocedural laboratory examination: Secondary | ICD-10-CM | POA: Insufficient documentation

## 2020-02-02 LAB — SARS CORONAVIRUS 2 (TAT 6-24 HRS): SARS Coronavirus 2: NEGATIVE

## 2020-02-05 ENCOUNTER — Ambulatory Visit
Admission: RE | Admit: 2020-02-05 | Discharge: 2020-02-05 | Disposition: A | Discharge status: Home / Self Care | Payer: Medicare Other | Attending: Gastroenterology | Admitting: Gastroenterology

## 2020-02-05 ENCOUNTER — Encounter
Admission: RE | Disposition: A | Discharge status: Home / Self Care | Payer: Self-pay | Source: Home / Self Care | Attending: Gastroenterology

## 2020-02-05 ENCOUNTER — Ambulatory Visit: Payer: Medicare Other | Admitting: Registered Nurse

## 2020-02-05 ENCOUNTER — Other Ambulatory Visit: Payer: Self-pay

## 2020-02-05 ENCOUNTER — Encounter: Payer: Self-pay | Admitting: Gastroenterology

## 2020-02-05 DIAGNOSIS — D126 Benign neoplasm of colon, unspecified: Secondary | ICD-10-CM | POA: Insufficient documentation

## 2020-02-05 DIAGNOSIS — D124 Benign neoplasm of descending colon: Secondary | ICD-10-CM | POA: Diagnosis not present

## 2020-02-05 DIAGNOSIS — Z85038 Personal history of other malignant neoplasm of large intestine: Secondary | ICD-10-CM | POA: Diagnosis not present

## 2020-02-05 DIAGNOSIS — K209 Esophagitis, unspecified without bleeding: Secondary | ICD-10-CM | POA: Insufficient documentation

## 2020-02-05 DIAGNOSIS — D123 Benign neoplasm of transverse colon: Secondary | ICD-10-CM | POA: Diagnosis not present

## 2020-02-05 DIAGNOSIS — D125 Benign neoplasm of sigmoid colon: Secondary | ICD-10-CM | POA: Diagnosis not present

## 2020-02-05 DIAGNOSIS — E1140 Type 2 diabetes mellitus with diabetic neuropathy, unspecified: Secondary | ICD-10-CM | POA: Insufficient documentation

## 2020-02-05 DIAGNOSIS — B3781 Candidal esophagitis: Secondary | ICD-10-CM

## 2020-02-05 DIAGNOSIS — Z794 Long term (current) use of insulin: Secondary | ICD-10-CM | POA: Insufficient documentation

## 2020-02-05 DIAGNOSIS — Z980 Intestinal bypass and anastomosis status: Secondary | ICD-10-CM | POA: Diagnosis not present

## 2020-02-05 DIAGNOSIS — G4700 Insomnia, unspecified: Secondary | ICD-10-CM | POA: Insufficient documentation

## 2020-02-05 DIAGNOSIS — F419 Anxiety disorder, unspecified: Secondary | ICD-10-CM | POA: Insufficient documentation

## 2020-02-05 DIAGNOSIS — Z7982 Long term (current) use of aspirin: Secondary | ICD-10-CM | POA: Insufficient documentation

## 2020-02-05 DIAGNOSIS — K317 Polyp of stomach and duodenum: Secondary | ICD-10-CM | POA: Diagnosis not present

## 2020-02-05 DIAGNOSIS — Z79899 Other long term (current) drug therapy: Secondary | ICD-10-CM | POA: Diagnosis not present

## 2020-02-05 DIAGNOSIS — K635 Polyp of colon: Secondary | ICD-10-CM | POA: Diagnosis not present

## 2020-02-05 DIAGNOSIS — K6289 Other specified diseases of anus and rectum: Secondary | ICD-10-CM | POA: Diagnosis not present

## 2020-02-05 DIAGNOSIS — N182 Chronic kidney disease, stage 2 (mild): Secondary | ICD-10-CM | POA: Diagnosis not present

## 2020-02-05 DIAGNOSIS — D122 Benign neoplasm of ascending colon: Secondary | ICD-10-CM | POA: Diagnosis not present

## 2020-02-05 DIAGNOSIS — B379 Candidiasis, unspecified: Secondary | ICD-10-CM | POA: Diagnosis not present

## 2020-02-05 DIAGNOSIS — F329 Major depressive disorder, single episode, unspecified: Secondary | ICD-10-CM | POA: Diagnosis not present

## 2020-02-05 DIAGNOSIS — K219 Gastro-esophageal reflux disease without esophagitis: Secondary | ICD-10-CM | POA: Insufficient documentation

## 2020-02-05 DIAGNOSIS — E1142 Type 2 diabetes mellitus with diabetic polyneuropathy: Secondary | ICD-10-CM | POA: Diagnosis not present

## 2020-02-05 DIAGNOSIS — I129 Hypertensive chronic kidney disease with stage 1 through stage 4 chronic kidney disease, or unspecified chronic kidney disease: Secondary | ICD-10-CM | POA: Insufficient documentation

## 2020-02-05 DIAGNOSIS — E1122 Type 2 diabetes mellitus with diabetic chronic kidney disease: Secondary | ICD-10-CM | POA: Insufficient documentation

## 2020-02-05 DIAGNOSIS — Z1211 Encounter for screening for malignant neoplasm of colon: Secondary | ICD-10-CM | POA: Insufficient documentation

## 2020-02-05 DIAGNOSIS — D128 Benign neoplasm of rectum: Secondary | ICD-10-CM | POA: Diagnosis not present

## 2020-02-05 DIAGNOSIS — R1310 Dysphagia, unspecified: Secondary | ICD-10-CM | POA: Insufficient documentation

## 2020-02-05 HISTORY — PX: COLONOSCOPY WITH PROPOFOL: SHX5780

## 2020-02-05 HISTORY — PX: ESOPHAGOGASTRODUODENOSCOPY (EGD) WITH PROPOFOL: SHX5813

## 2020-02-05 LAB — GLUCOSE, CAPILLARY: Glucose-Capillary: 191 mg/dL — ABNORMAL HIGH (ref 70–99)

## 2020-02-05 SURGERY — COLONOSCOPY WITH PROPOFOL
Anesthesia: General

## 2020-02-05 MED ORDER — SODIUM CHLORIDE 0.9 % IV SOLN
INTRAVENOUS | Status: DC
  Administered 2020-02-05: 09:00:00 1000 mL via INTRAVENOUS

## 2020-02-05 MED ORDER — PHENYLEPHRINE HCL (PRESSORS) 10 MG/ML IV SOLN
INTRAVENOUS | Status: DC | PRN
  Administered 2020-02-05: 100 ug via INTRAVENOUS

## 2020-02-05 MED ORDER — PROPOFOL 10 MG/ML IV BOLUS
INTRAVENOUS | Status: DC | PRN
  Administered 2020-02-05: 80 mg via INTRAVENOUS

## 2020-02-05 MED ORDER — PROPOFOL 500 MG/50ML IV EMUL
INTRAVENOUS | Status: DC | PRN
  Administered 2020-02-05: 140 ug/kg/min via INTRAVENOUS

## 2020-02-05 MED ORDER — LIDOCAINE HCL (CARDIAC) PF 100 MG/5ML IV SOSY
PREFILLED_SYRINGE | INTRAVENOUS | Status: DC | PRN
  Administered 2020-02-05: 100 mg via INTRAVENOUS

## 2020-02-05 NOTE — Op Note (Signed)
[facility] Gastroenterology Patient Name: Jessica Montoya Procedure Date: 02/05/2020 8:43 AM MRN: [id number] Account #: [id number] Date of Birth: [birth year] Admit Type: Outpatient Age: 69 Room: [facility] ENDO ROOM 2 Gender: Female Note Status: Finalized Procedure:             Upper GI endoscopy Indications:           Dysphagia Providers:             Michelle Wnek B. Bonna Gains MD, MD Referring MD:          Franchot Gallo (Referring MD) Medicines:             Monitored Anesthesia Care Complications:         No immediate complications. Procedure:             Pre-Anesthesia Assessment:                        - Prior to the procedure, a History and Physical was                         performed, and patient medications, allergies and                         sensitivities were reviewed. The patient's tolerance                         of previous anesthesia was reviewed.                        - The risks and benefits of the procedure and the                         sedation options and risks were discussed with the                         patient. All questions were answered and informed                         consent was obtained.                        - Patient identification and proposed procedure were                         verified prior to the procedure by the physician, the                         nurse, the anesthesiologist, the anesthetist and the                         technician. The procedure was verified in the                         procedure room.                        - ASA Grade Assessment: II - A patient with mild                         systemic disease.  After obtaining informed consent, the endoscope was                         passed under direct vision. Throughout the procedure,                         the patient's blood pressure, pulse, and oxygen                         saturations were monitored continuously. The Endoscope                  was introduced through the mouth, and advanced to the                         second part of duodenum. The upper GI endoscopy was                         accomplished with ease. The patient tolerated the                         procedure well. Findings:      Patchy, white plaques were found in the mid esophagus and in the distal       esophagus. Brushings for KOH prep were obtained in the entire esophagus.      The exam of the esophagus was otherwise normal.      Multiple 3 to 7 mm sessile polyps with no bleeding and no stigmata of       recent bleeding were found in the gastric fundus and in the gastric       body. Biopsies were taken with a cold forceps for histology.      The entire examined stomach was normal.      The duodenal bulb, second portion of the duodenum and examined duodenum       were normal. Impression:            - Esophageal plaques were found, suspicious for                         candidiasis. Brushings performed.                        - Multiple gastric polyps. Biopsied.                        - Normal stomach.                        - Normal duodenal bulb, second portion of the duodenum                         and examined duodenum. Recommendation:        - Discharge patient to home (with escort).                        - Advance diet as tolerated.                        - Continue present medications.                        -  Patient has a contact number available for                         emergencies. The signs and symptoms of potential                         delayed complications were discussed with the patient.                         Return to normal activities tomorrow. Written                         discharge instructions were provided to the patient.                        - Discharge patient to home (with escort).                        - The findings and recommendations were discussed with                         the patient.                         - The findings and recommendations were discussed with                         the patient's family.                        - Treat with antifungal if brushings show yeast Procedure Code(s):     --- Professional ---                        [phone], Esophagogastroduodenoscopy, flexible,                         transoral; with biopsy, single or multiple Diagnosis Code(s):     --- Professional ---                        K22.9, Disease of esophagus, unspecified                        K31.7, Polyp of stomach and duodenum                        R13.10, Dysphagia, unspecified CPT copyright 2019 American Medical Association. All rights reserved. The codes documented in this report are preliminary and upon coder review may  be revised to meet current compliance requirements.  Vonda Antigua, MD Margretta Sidle B. Bonna Gains MD, MD 02/05/2020 9:09:11 AM This report has been signed electronically. Number of Addenda: 0 Note Initiated On: 02/05/2020 8:43 AM Estimated Blood Loss:  Estimated blood loss: none.      [facility]

## 2020-02-05 NOTE — Transfer of Care (Signed)
Immediate Anesthesia Transfer of Care Note  Patient: Jessica Montoya  Procedure(s) Performed: COLONOSCOPY WITH PROPOFOL (N/A ) ESOPHAGOGASTRODUODENOSCOPY (EGD) WITH PROPOFOL (N/A )  Patient Location: PACU  Anesthesia Type:General  Level of Consciousness: sedated  Airway & Oxygen Therapy: Patient Spontanous Breathing  Post-op Assessment: Report given to RN and Post -op Vital signs reviewed and stable  Post vital signs: Reviewed and stable  Last Vitals:  Vitals Value Taken Time  BP    Temp    Pulse 83 02/05/20 0959  Resp 19 02/05/20 0959  SpO2 99 % 02/05/20 0959  Vitals shown include unvalidated device data.  Last Pain:  Vitals:   02/05/20 0824  TempSrc: Temporal  PainSc: 0-No pain      Patients Stated Pain Goal: 0 ([id number])  Complications: No apparent anesthesia complications

## 2020-02-05 NOTE — Anesthesia Preprocedure Evaluation (Signed)
Anesthesia Evaluation  Patient identified by MRN, date of birth, ID band Patient awake    Reviewed: Allergy & Precautions, NPO status , Patient's Chart, lab work & pertinent test results  History of Anesthesia Complications Negative for: history of anesthetic complications  Airway Mallampati: III       Dental  (+) Upper Dentures, Lower Dentures   Pulmonary neg sleep apnea, neg COPD, Not current smoker,           Cardiovascular hypertension, Pt. on medications (-) Past MI and (-) CHF (-) dysrhythmias (-) Valvular Problems/Murmurs     Neuro/Psych neg Seizures Anxiety Depression    GI/Hepatic Neg liver ROS, GERD  Medicated and Controlled,  Endo/Other  diabetes, Type 2, Insulin Dependent  Renal/GU Renal InsufficiencyRenal disease     Musculoskeletal   Abdominal   Peds  Hematology   Anesthesia Other Findings   Reproductive/Obstetrics                             Anesthesia Physical Anesthesia Plan  ASA: III  Anesthesia Plan: General   Post-op Pain Management:    Induction: Intravenous  PONV Risk Score and Plan: 3 and Propofol infusion, TIVA and Treatment may vary due to age or medical condition  Airway Management Planned: Nasal Cannula  Additional Equipment:   Intra-op Plan:   Post-operative Plan:   Informed Consent: I have reviewed the patients History and Physical, chart, labs and discussed the procedure including the risks, benefits and alternatives for the proposed anesthesia with the patient or authorized representative who has indicated his/her understanding and acceptance.       Plan Discussed with:   Anesthesia Plan Comments:         Anesthesia Quick Evaluation

## 2020-02-05 NOTE — Anesthesia Postprocedure Evaluation (Signed)
Anesthesia Post Note  Patient: Addilynn Cramer  Procedure(s) Performed: COLONOSCOPY WITH PROPOFOL (N/A ) ESOPHAGOGASTRODUODENOSCOPY (EGD) WITH PROPOFOL (N/A )  Patient location during evaluation: Endoscopy Anesthesia Type: General Level of consciousness: awake and alert Pain management: pain level controlled Vital Signs Assessment: post-procedure vital signs reviewed and stable Respiratory status: spontaneous breathing and respiratory function stable Cardiovascular status: stable Anesthetic complications: no     Last Vitals:  Vitals:   02/05/20 1000 02/05/20 1010  BP: 124/62 (!) 135/114  Pulse: 83 90  Resp: (!) 22 20  Temp:    SpO2: 98% 93%    Last Pain:  Vitals:   02/05/20 0950  TempSrc: Temporal  PainSc:                  Katrena Stehlin K

## 2020-02-05 NOTE — Op Note (Signed)
[facility] Patient Name: Jessica Montoya Procedure Date: 02/05/2020 8:43 AM MRN: [id number] Account #: [id number] Date of Birth: November 25, 1950 Admit Type: Outpatient Age: 69 Room: [facility] ENDO ROOM 2 Gender: Female Note Status: Finalized Procedure:             Colonoscopy Indications:           High risk colon cancer surveillance: Personal history                         of colon cancer Providers:             Perry Molla B. Bonna Gains MD, MD Referring MD:          Franchot Gallo (Referring MD) Medicines:             Monitored Anesthesia Care Complications:         No immediate complications. Procedure:             Pre-Anesthesia Assessment:                        - ASA Grade Assessment: II - A patient with mild                         systemic disease.                        - Prior to the procedure, a History and Physical was                         performed, and patient medications, allergies and                         sensitivities were reviewed. The patient's tolerance                         of previous anesthesia was reviewed.                        - The risks and benefits of the procedure and the                         sedation options and risks were discussed with the                         patient. All questions were answered and informed                         consent was obtained.                        - Patient identification and proposed procedure were                         verified prior to the procedure by the physician, the                         nurse, the anesthesiologist, the anesthetist and the                         technician. The procedure was verified  in the                         procedure room.                        After obtaining informed consent, the colonoscope was                         passed under direct vision. Throughout the procedure,                         the patient's blood pressure, pulse, and oxygen                          saturations were monitored continuously. The                         Colonoscope was introduced through the anus and                         advanced to the the ileocolonic anastomosis. The                         colonoscopy was performed with ease. The patient                         tolerated the procedure well. The quality of the bowel                         preparation was good. Findings:      The perianal and digital rectal examinations were normal.      There was evidence of a prior end-to-side ileo-colonic anastomosis in       the ascending colon. This was patent and was characterized by healthy       appearing mucosa.      A 15 mm polyp was found at 70 cm proximal to the anus. The polyp was       flat. Area was successfully injected with 4 mL saline for lesion       assessment, and this injection appeared to lift the lesion adequately.       The polyp was removed with a piecemeal technique using a hot snare.       Resection and retrieval were complete using a suction (via the working       channel). One hemostatic clip was successfully placed.      A 5 mm polyp was found at 60 cm proximal to the anus. The polyp was       sessile. The polyp was removed with a cold biopsy forceps. Resection and       retrieval were complete.      Two sessile polyps were found in the descending colon. The polyps were 4       to 5 mm in size. These polyps were removed with a cold biopsy forceps.       Resection and retrieval were complete.      A 10 mm polyp was found in the sigmoid colon. The polyp was       pedunculated. The polyp was removed with a hot snare. Resection and       retrieval were complete.  A patchy area of mildly nodular mucosa was found in the rectum. Biopsies       were taken with a cold forceps for histology.      Anal papilla(e) were hypertrophied.      No additional abnormalities were found on retroflexion.      The exam was otherwise without  abnormality. Impression:            - Patent end-to-side ileo-colonic anastomosis,                         characterized by healthy appearing mucosa.                        - One 15 mm polyp at 70 cm proximal to the anus,                         removed piecemeal using a hot snare. Resected and                         retrieved. Injected. Clip was placed.                        - One 5 mm polyp at 60 cm proximal to the anus,                         removed with a cold biopsy forceps. Resected and                         retrieved.                        - Two 4 to 5 mm polyps in the descending colon,                         removed with a cold biopsy forceps. Resected and                         retrieved.                        - One 10 mm polyp in the sigmoid colon, removed with a                         hot snare. Resected and retrieved.                        - Nodular mucosa in the rectum. Biopsied.                        - Anal papilla(e) were hypertrophied.                        - The examination was otherwise normal. Recommendation:        - Discharge patient to home (with escort).                        - Advance diet as tolerated.                        -  Continue present medications.                        - Await pathology results.                        - Repeat colonoscopy in 1 year.                        - The findings and recommendations were discussed with                         the patient.                        - The findings and recommendations were discussed with                         the patient's family.                        - Return to primary care physician as previously                         scheduled. Procedure Code(s):     --- Professional ---                        [phone], Colonoscopy, flexible; with removal of                         tumor(s), polyp(s), or other lesion(s) by snare                         technique                        [id number],  Colonoscopy, flexible; with directed submucosal                         injection(s), any substance                        [id number], 19, Colonoscopy, flexible; with biopsy, single                         or multiple Diagnosis Code(s):     --- Professional ---                        K63.5, Polyp of colon                        K62.89, Other specified diseases of anus and rectum                        Z85.038, Personal history of other malignant neoplasm                         of large intestine                        Z98.0, Intestinal bypass and anastomosis status CPT copyright 2019 American Medical Association. All rights reserved. The  codes documented in this report are preliminary and upon coder review may  be revised to meet current compliance requirements.  Vonda Antigua, MD Margretta Sidle B. Bonna Gains MD, MD 02/05/2020 10:06:27 AM This report has been signed electronically. Number of Addenda: 0 Note Initiated On: 02/05/2020 8:43 AM Scope Withdrawal Time: 0 hours 34 minutes 15 seconds  Total Procedure Duration: 0 hours 44 minutes 18 seconds  Estimated Blood Loss:  Estimated blood loss: none.      [facility]

## 2020-02-05 NOTE — Progress Notes (Signed)
Vonda Antigua, MD [address], [place], [place], [place], [address] [address], [place], [place], [place], [address] Phone: [phone]  Fax: [phone]  Primary Care Physician:  [name], NP   Pre-Procedure History & Physical: HPI:  Jessica Montoya is a 69 y.o. female is here for a colonoscopy and EGD.   Past Medical History:  Diagnosis Date  . Anxiety   . Arthritis    knees  . Chronic pain   . CKD (chronic kidney disease), stage II   . Depression   . Diabetes mellitus without complication ([place])   . Diabetic neuropathy ([place])   . Fatigue    a. 05/2018 Echo: EF 60-65%, no rwma, nl RV fxn; b. 05/2018 Lexiscan MV: EF>65%. No ischemia/infarct.  . Hyperlipidemia   . Hypertension   . Insomnia   . Long term current use of insulin ([place])   . Peripheral neuropathy   . Vertigo    2x/month  . Wears dentures    full upper and lower    Past Surgical History:  Procedure Laterality Date  . ABDOMINAL HYSTERECTOMY    . CATARACT EXTRACTION W/PHACO Left 02/20/2019   Procedure: CATARACT EXTRACTION PHACO AND INTRAOCULAR LENS PLACEMENT ([place]) LEFT DIABETES;  Surgeon: Birder Robson, MD;  Location: [place];  Service: Ophthalmology;  Laterality: Left;  . CATARACT EXTRACTION W/PHACO Right 03/27/2019   Procedure: CATARACT EXTRACTION PHACO AND INTRAOCULAR LENS PLACEMENT ([place])  RIGHT DIABETIC;  Surgeon: Birder Robson, MD;  Location: [place];  Service: Ophthalmology;  Laterality: Right;  Diabetic - insulin  . COLON SURGERY      Prior to Admission medications   Medication Sig Start Date End Date Taking? Authorizing Provider  aspirin 81 MG tablet Take 81 mg by mouth daily.   Yes [provider]  Cholecalciferol (VITAMIN D PO) Take 2,000 Units by mouth daily.    Yes [provider]  Dulaglutide 0.75 MG/0.5ML SOPN Inject into the skin. 12/18/19  Yes [provider]  ferrous fumarate (HEMOCYTE - 106 MG FE) 325 (106 FE) MG  TABS tablet Take 1 tablet by mouth daily.   Yes [provider]  FLUoxetine (PROZAC) 40 MG capsule Take 1 capsule (40 mg total) by mouth daily. 12/10/19  Yes [name], Jolene T, NP  gabapentin (NEURONTIN) 300 MG capsule Take 1 capsule (300 mg total) by mouth 2 (two) times daily. Patient taking differently: Take 300 mg by mouth daily.  07/30/19  Yes [name], Jolene T, NP  HUMULIN R U-500 KWIKPEN 500 UNIT/ML kwikpen Inject 70 Units into the skin 3 (three) times daily with meals. Patient taking differently: Inject into the skin 3 (three) times daily with meals. 50 units at 8 AM, 50 units at 2 PM, 30 units at 8 PM 12/12/19  Yes Fritzi Mandes, MD  hydrOXYzine (VISTARIL) 25 MG capsule TAKE 1 CAPSULE (25 MG TOTAL) BY MOUTH 2 (TWO) TIMES DAILY AS NEEDED (DIZZINESS). 12/17/19  Yes [name], Henrine Screws T, NP  Multiple Vitamin (MULTIVITAMIN) tablet Take 1 tablet by mouth daily.   Yes [provider]  omeprazole (PRILOSEC) 20 MG capsule Take 1 capsule (20 mg total) by mouth daily. 07/30/19  Yes [name], Jolene T, NP  ramipril (ALTACE) 2.5 MG capsule Take 1 capsule (2.5 mg total) by mouth daily. 07/30/19  Yes [name], Jolene T, NP  simvastatin (ZOCOR) 40 MG tablet Take 1 tablet (40 mg total) by mouth daily. 07/30/19  Yes [name], Jolene T, NP  traZODone (DESYREL) 50 MG tablet Take 1  tablet (50 mg total) by mouth at bedtime. 12/10/19  Yes [name], Jolene T, NP  vitamin B-12 (CYANOCOBALAMIN) 1000 MCG tablet Take 2,000 mcg by mouth daily.   Yes [provider]    Allergies as of 12/19/2019 - Review Complete 12/19/2019  Allergen Reaction Noted  . Compazine [prochlorperazine edisylate] Rash 05/21/2015  . Exenatide Nausea Only 02/04/2016  . Insulin aspart Nausea Only 05/21/2015    Family History  Problem Relation Age of Onset  . Cancer Mother        melanoma  . Breast cancer Mother 68  . Cancer Father        pancreatic  . Hypertension Maternal Grandmother   . Cancer Maternal Grandmother         colon  . Cancer Paternal Grandmother        stomach  . Stroke Paternal Grandfather   . Cancer Maternal Grandfather        kidney    Social History   Socioeconomic History  . Marital status: Married    Spouse name: Not on file  . Number of children: Not on file  . Years of education: Not on file  . Highest education level: High school graduate  Occupational History  . Not on file  Tobacco Use  . Smoking status: Never Smoker  . Smokeless tobacco: Never Used  Substance and Sexual Activity  . Alcohol use: No    Alcohol/week: 0.0 standard drinks  . Drug use: No  . Sexual activity: Yes  Other Topics Concern  . Not on file  Social History Narrative  . Not on file   Social Determinants of Health   Financial Resource Strain: Low Risk   . Difficulty of Paying Living Expenses: Not very hard  Food Insecurity: No Food Insecurity  . Worried About Charity fundraiser in the Last Year: Never true  . Ran Out of Food in the Last Year: Never true  Transportation Needs: No Transportation Needs  . Lack of Transportation (Medical): No  . Lack of Transportation (Non-Medical): No  Physical Activity: Inactive  . Days of Exercise per Week: 0 days  . Minutes of Exercise per Session: 0 min  Stress: No Stress Concern Present  . Feeling of Stress : Not at all  Social Connections: Somewhat Isolated  . Frequency of Communication with Friends and Family: More than three times a week  . Frequency of Social Gatherings with Friends and Family: More than three times a week  . Attends Religious Services: Never  . Active Member of Clubs or Organizations: No  . Attends Archivist Meetings: Never  . Marital Status: Married  Human resources officer Violence: Not At Risk  . Fear of Current or Ex-Partner: No  . Emotionally Abused: No  . Physically Abused: No  . Sexually Abused: No    Review of Systems: See HPI, otherwise negative ROS  Physical Exam: BP 103/61   Pulse 90   Temp (!) 96.8 F  (36 C) (Temporal)   Resp 20   Ht 5\' 6"  (1.676 m)   Wt 73.9 kg   LMP  (LMP Unknown)   SpO2 99%   BMI 26.31 kg/m  General:   Alert,  pleasant and cooperative in NAD Head:  Normocephalic and atraumatic. Neck:  Supple; no masses or thyromegaly. Lungs:  Clear throughout to auscultation, normal respiratory effort.    Heart:  +S1, +S2, Regular rate and rhythm, No edema. Abdomen:  Soft, nontender and nondistended. Normal bowel sounds, without guarding,  and without rebound.   Neurologic:  Alert and  oriented x4;  grossly normal neurologically.  Impression/Plan: Jessica Montoya is here for a colonoscopy to be performed for history of colon cancer and EGD for dysphagia.  Risks, benefits, limitations, and alternatives regarding the procedures have been reviewed with the patient.  Questions have been answered.  All parties agreeable.   Virgel Manifold, MD  02/05/2020, 8:50 AM

## 2020-02-06 ENCOUNTER — Ambulatory Visit: Payer: Medicare Other | Admitting: Gastroenterology

## 2020-02-06 ENCOUNTER — Encounter: Payer: Self-pay | Admitting: *Deleted

## 2020-02-07 LAB — CYTOLOGY - NON PAP

## 2020-02-07 LAB — SURGICAL PATHOLOGY

## 2020-02-11 ENCOUNTER — Telehealth: Payer: Self-pay

## 2020-02-11 ENCOUNTER — Other Ambulatory Visit: Payer: Self-pay | Admitting: Gastroenterology

## 2020-02-11 MED ORDER — FLUCONAZOLE 200 MG PO TABS: ORAL_TABLET | ORAL | 0 refills | Status: AC

## 2020-02-11 MED ORDER — FLUCONAZOLE 200 MG PO TABS: ORAL_TABLET | ORAL | 0 refills | Status: DC

## 2020-02-11 NOTE — Telephone Encounter (Signed)
-----   Message from Virgel Manifold, MD sent at 02/11/2020 10:48 AM EDT ----- Jessica Montoya please let the patient know, she has a fungus or yeast in her esophagus.  This can cause issues with swallowing.  I have sent a medication over to her pharmacy.  While she is on this medication, she should hold her simvastatin.  After she finishes this medication, she can start taking the simvastatin again.  Please document that you explained this to her.  Thank you

## 2020-02-11 NOTE — Telephone Encounter (Signed)
Called patient to let her know that Dr. Bonna Gains reviewed her lab results and it showed that she had fungus or yeast in her esophagus and would need to start antibiotics. Therefore, patient was informed to stop taking her Simvastatin the day that she would start her antibiotic and restart her Simvastatin after she was done. Patient agreed and understood her instructions.

## 2020-02-17 DIAGNOSIS — E1142 Type 2 diabetes mellitus with diabetic polyneuropathy: Secondary | ICD-10-CM | POA: Diagnosis not present

## 2020-02-17 DIAGNOSIS — E11649 Type 2 diabetes mellitus with hypoglycemia without coma: Secondary | ICD-10-CM | POA: Diagnosis not present

## 2020-02-17 DIAGNOSIS — Z794 Long term (current) use of insulin: Secondary | ICD-10-CM | POA: Diagnosis not present

## 2020-02-17 DIAGNOSIS — E1129 Type 2 diabetes mellitus with other diabetic kidney complication: Secondary | ICD-10-CM | POA: Diagnosis not present

## 2020-02-19 ENCOUNTER — Ambulatory Visit (INDEPENDENT_AMBULATORY_CARE_PROVIDER_SITE_OTHER): Payer: Medicare Other | Admitting: Pharmacist

## 2020-02-19 DIAGNOSIS — IMO0002 Reserved for concepts with insufficient information to code with codable children: Secondary | ICD-10-CM

## 2020-02-19 DIAGNOSIS — E1122 Type 2 diabetes mellitus with diabetic chronic kidney disease: Secondary | ICD-10-CM | POA: Diagnosis not present

## 2020-02-19 DIAGNOSIS — E1165 Type 2 diabetes mellitus with hyperglycemia: Secondary | ICD-10-CM

## 2020-02-19 NOTE — Patient Instructions (Signed)
Visit Information  Goals Addressed            This Visit's Progress     Patient Stated   . PharmD "I can't afford my insulin" (pt-stated)       CARE PLAN ENTRY (see longtitudinal plan of care for additional care plan information)  Current Barriers:  . Diabetes: uncontrolled but improved; most recent A1c 10.8%; follows w/ Dr. Gabriel Montoya;  . Current antihyperglycemic regimen: Humulin R U500 40 units @ 8 am, 40 units @ noon, 30 8 pm; Trulicity 1.5 mg once weekly  o Previously approved for Humulin [id number] and Trulicity assistance through 08/20/2020 . Current glucose readings:  o Reports issues w/ hypoglycemia, particularly overnight, since initiation and escalation of Trulicity. Continues to reduce Humulin to reduce hypoglycemia o Reports readings 40-50 in the evenings, even with 35 units QPM . Cardiovascular risk reduction: o Current hypertensive regimen: ramipril 2.5 mg daily o Current hyperlipidemia regimen: simvastatin 40 mg daily, holding while on concurrent fluconazole therapy   Pharmacist Clinical Goal(s):  [name] Over the next 90 days, patient with work with PharmD and primary care provider to address optimized glycemic regimen  Interventions: . Comprehensive medication review performed, medication list updated in electronic medical record . Inter-disciplinary care team collaboration (see longitudinal plan of care) . Reviewed PK of Humulin U500 - onset ~15 minutes, peak 3-4 hours, duration 13-24 hours, half life elimination 4.5 hours. Recommended that patient try to adjust insulin doses to be with a meal, as opposed to at specific times of the day. This likely won't completely reduce episodes of hypoglycemia. Encouraged to reduce evening Humulin dose to 25 units, and continue to reduce doses if she develops hypoglycemia. Has f/u with Dr. Gabriel Montoya in ~3 weeks . Encouraged to discuss increasing Trulicity to 3 mg weekly, as patient is tolerating 1.5 mg. Continued reduction of insulin dosing will be  beneficial in reducing risk of hypoglycemia and promoting weight loss   Patient Self Care Activities:  . Patient will check blood glucose BID , document, and provide at future appointments . Patient will take medications as prescribed . Patient will report any questions or concerns to provider   Please see past updates related to this goal by clicking on the "Past Updates" button in the selected goal         Patient verbalizes understanding of instructions provided today.   Plan:  - Scheduled f/u call in ~7 weeks  Jessica Montoya, PharmD, [place] [phone]

## 2020-02-19 NOTE — Chronic Care Management (AMB) (Signed)
Chronic Care Management   Follow Up Note   02/19/2020 Name: Jessica Montoya MRN: [id number] DOB: November 06, 1950  Referred by: [name], NP Reason for referral : Chronic Care Management (Medication Management)   Jessica Montoya is a 69 y.o. year old female who is a primary care patient of [name], [name], NP. The CCM team was consulted for assistance with chronic disease management and care coordination needs.    Contacted patient for medication management review.   Review of patient status, including review of consultants reports, relevant laboratory and other test results, and collaboration with appropriate care team members and the patient's provider was performed as part of comprehensive patient evaluation and provision of chronic care management services.    SDOH (Social Determinants of Health) assessments performed: Yes See Care Plan activities for detailed interventions related to [facility])     Outpatient Encounter Medications as of 02/19/2020  Medication Sig  . aspirin 81 MG tablet Take 81 mg by mouth daily.  . Cholecalciferol (VITAMIN D PO) Take 2,000 Units by mouth daily.   . Dulaglutide (TRULICITY [place]) Inject 1.5 mg into the skin once a week.  . ferrous fumarate (HEMOCYTE - 106 MG FE) 325 (106 FE) MG TABS tablet Take 1 tablet by mouth daily.  . fluconazole (DIFLUCAN) 200 MG tablet Take 2 tablets (400 mg total) by mouth daily for 1 day, THEN 1 tablet (200 mg total) daily for 14 days. hold simvastatin while taking fluconazole and resume simvastatin after completing fluconazole.  [name] FLUoxetine (PROZAC) 40 MG capsule Take 1 capsule (40 mg total) by mouth daily.  [name] gabapentin (NEURONTIN) 300 MG capsule Take 1 capsule (300 mg total) by mouth 2 (two) times daily. (Patient taking differently: Take 300 mg by mouth daily. )  . HUMULIN R U-500 KWIKPEN 500 UNIT/ML kwikpen Inject 70 Units into the skin 3 (three) times daily with meals. (Patient taking differently: Inject into the  skin 3 (three) times daily with meals. 40 units QAM, 40 units w/ lunch, 30-35 QPM)  . hydrOXYzine (VISTARIL) 25 MG capsule TAKE 1 CAPSULE (25 MG TOTAL) BY MOUTH 2 (TWO) TIMES DAILY AS NEEDED (DIZZINESS).  . Multiple Vitamin (MULTIVITAMIN) tablet Take 1 tablet by mouth daily.  [name] omeprazole (PRILOSEC) 20 MG capsule Take 1 capsule (20 mg total) by mouth daily.  . ramipril (ALTACE) 2.5 MG capsule Take 1 capsule (2.5 mg total) by mouth daily.  . traZODone (DESYREL) 50 MG tablet Take 1 tablet (50 mg total) by mouth at bedtime.  . vitamin B-12 (CYANOCOBALAMIN) 1000 MCG tablet Take 2,000 mcg by mouth daily.  . simvastatin (ZOCOR) 40 MG tablet Take 1 tablet (40 mg total) by mouth daily.  . [DISCONTINUED] Dulaglutide 0.75 MG/0.5ML SOPN Inject into the skin.   No facility-administered encounter medications on file as of 02/19/2020.     Objective:   Goals Addressed            This Visit's Progress     Patient Stated   . PharmD "I can't afford my insulin" (pt-stated)       CARE PLAN ENTRY (see longtitudinal plan of care for additional care plan information)  Current Barriers:  . Diabetes: uncontrolled but improved; most recent A1c 10.8%; follows w/ Dr. [name];  . Current antihyperglycemic regimen: Humulin R U500 40 units @ 8 am, 40 units @ noon, 30 8 pm; Trulicity 1.5 mg once weekly  o Previously approved for Humulin [id number] and Trulicity assistance through 08/20/2020 . Current glucose readings:  o Reports issues w/ hypoglycemia, particularly overnight, since initiation and escalation of Trulicity. Continues to reduce Humulin to reduce hypoglycemia o Reports readings 40-50 in the evenings, even with 35 units QPM . Cardiovascular risk reduction: o Current hypertensive regimen: ramipril 2.5 mg daily o Current hyperlipidemia regimen: simvastatin 40 mg daily, holding while on concurrent fluconazole therapy   Pharmacist Clinical Goal(s):  [name] Over the next 90 days, patient with work with PharmD and  primary care provider to address optimized glycemic regimen  Interventions: . Comprehensive medication review performed, medication list updated in electronic medical record . Inter-disciplinary care team collaboration (see longitudinal plan of care) . Reviewed PK of Humulin U500 - onset ~15 minutes, peak 3-4 hours, duration 13-24 hours, half life elimination 4.5 hours. Recommended that patient try to adjust insulin doses to be with a meal, as opposed to at specific times of the day. This likely won't completely reduce episodes of hypoglycemia. Encouraged to reduce evening Humulin dose to 25 units, and continue to reduce doses if she develops hypoglycemia. Has f/u with Dr. [name] in ~3 weeks . Encouraged to discuss increasing Trulicity to 3 mg weekly, as patient is tolerating 1.5 mg. Continued reduction of insulin dosing will be beneficial in reducing risk of hypoglycemia and promoting weight loss   Patient Self Care Activities:  . Patient will check blood glucose BID , document, and provide at future appointments . Patient will take medications as prescribed . Patient will report any questions or concerns to provider   Please see past updates related to this goal by clicking on the "Past Updates" button in the selected goal          Plan:  - Scheduled f/u call in ~7 weeks  [name], PharmD, [place] [phone]

## 2020-02-20 ENCOUNTER — Encounter: Payer: Self-pay | Admitting: Gastroenterology

## 2020-02-20 ENCOUNTER — Ambulatory Visit: Payer: Medicare Other | Admitting: Gastroenterology

## 2020-02-20 ENCOUNTER — Other Ambulatory Visit: Payer: Self-pay

## 2020-02-20 VITALS — BP 81/53 | HR 90 | Temp 97.7°F | Wt 161.8 lb

## 2020-02-20 DIAGNOSIS — R5383 Other fatigue: Secondary | ICD-10-CM

## 2020-02-20 DIAGNOSIS — D509 Iron deficiency anemia, unspecified: Secondary | ICD-10-CM | POA: Diagnosis not present

## 2020-02-20 DIAGNOSIS — R1310 Dysphagia, unspecified: Secondary | ICD-10-CM

## 2020-02-21 LAB — FERRITIN: Ferritin: 113 ng/mL (ref 15–150)

## 2020-02-21 LAB — CBC
Hematocrit: 37 % (ref 34.0–46.6)
Hemoglobin: 12.3 g/dL (ref 11.1–15.9)
MCH: 30 pg (ref 26.6–33.0)
MCHC: 33.2 g/dL (ref 31.5–35.7)
MCV: 90 fL (ref 79–97)
Platelets: 212 10*3/uL (ref 150–450)
RBC: 4.1 x10E6/uL (ref 3.77–5.28)
RDW: 13 % (ref 11.7–15.4)
WBC: 6.9 10*3/uL (ref 3.4–10.8)

## 2020-02-21 LAB — IRON AND TIBC
Iron Saturation: 21 % (ref 15–55)
Iron: 75 ug/dL (ref 27–139)
Total Iron Binding Capacity: 351 ug/dL (ref 250–450)
UIBC: 276 ug/dL (ref 118–369)

## 2020-02-21 NOTE — Progress Notes (Signed)
Vonda Antigua, MD [address]  [place], [place] [address]  Main: [phone]  Fax: [phone]   Primary Care Physician: [name], NP   Chief Complaint  Patient presents with  . Follow-up    Patient stated that she is currently feeling tired and fatigued. Patient continues to take her antibiotic and has stopped her Simvastatin in the meantime.    HPI: Jessica Montoya is a 69 y.o. female here for follow-up of dysphagia.  This is better.  She is on fluconazole due to yeast seen on brushings.  Dysphagia is improving.  No abdominal pain.  She has held her simvastatin since starting the fluconazole and knows to resume it the day after she finishes her fluconazole.  Current Outpatient Medications  Medication Sig Dispense Refill  . aspirin 81 MG tablet Take 81 mg by mouth daily.    . Cholecalciferol (VITAMIN D PO) Take 2,000 Units by mouth daily.     . Dulaglutide (TRULICITY [place]) Inject 1.5 mg into the skin once a week.    . ferrous fumarate (HEMOCYTE - 106 MG FE) 325 (106 FE) MG TABS tablet Take 1 tablet by mouth daily.    . fluconazole (DIFLUCAN) 200 MG tablet Take 2 tablets (400 mg total) by mouth daily for 1 day, THEN 1 tablet (200 mg total) daily for 14 days. hold simvastatin while taking fluconazole and resume simvastatin after completing fluconazole. 16 tablet 0  . FLUoxetine (PROZAC) 40 MG capsule Take 1 capsule (40 mg total) by mouth daily. 90 capsule 3  . gabapentin (NEURONTIN) 300 MG capsule Take 1 capsule (300 mg total) by mouth 2 (two) times daily. (Patient taking differently: Take 300 mg by mouth daily. ) 180 capsule 2  . HUMULIN R U-500 KWIKPEN 500 UNIT/ML kwikpen Inject 70 Units into the skin 3 (three) times daily with meals. (Patient taking differently: Inject into the skin 3 (three) times daily with meals. 40 units QAM, 40 units w/ lunch, 30-35 QPM) 1 pen 0  . hydrOXYzine (VISTARIL) 25 MG capsule TAKE 1 CAPSULE (25 MG TOTAL) BY MOUTH 2  (TWO) TIMES DAILY AS NEEDED (DIZZINESS). 180 capsule 0  . Multiple Vitamin (MULTIVITAMIN) tablet Take 1 tablet by mouth daily.    [name] omeprazole (PRILOSEC) 20 MG capsule Take 1 capsule (20 mg total) by mouth daily. 90 capsule 3  . ramipril (ALTACE) 2.5 MG capsule Take 1 capsule (2.5 mg total) by mouth daily. 90 capsule 3  . traZODone (DESYREL) 50 MG tablet Take 1 tablet (50 mg total) by mouth at bedtime. 90 tablet 3  . vitamin B-12 (CYANOCOBALAMIN) 1000 MCG tablet Take 2,000 mcg by mouth daily.    . simvastatin (ZOCOR) 40 MG tablet Take 1 tablet (40 mg total) by mouth daily. (Patient not taking: Reported on 02/20/2020) 90 tablet 3   No current facility-administered medications for this visit.    Allergies as of 02/20/2020 - Review Complete 02/20/2020  Allergen Reaction Noted  . Compazine [prochlorperazine edisylate] Rash 05/21/2015  . Exenatide Nausea Only 02/04/2016  . Insulin aspart Nausea Only 05/21/2015    ROS:  General: Negative for anorexia, weight loss, fever, chills, fatigue, weakness. ENT: Negative for hoarseness, difficulty swallowing , nasal congestion. CV: Negative for chest pain, angina, palpitations, dyspnea on exertion, peripheral edema.  Respiratory: Negative for dyspnea at rest, dyspnea on exertion, cough, sputum, wheezing.  GI: See history of present illness. GU:  Negative for dysuria, hematuria, urinary incontinence, urinary frequency, nocturnal urination.  Endo: Negative for unusual weight change.    Physical Examination:   BP (!) 81/53   Pulse 90   Temp 97.7 F (36.5 C) (Oral)   Wt 161 lb 12.8 oz (73.4 kg)   LMP  (LMP Unknown)   BMI 26.12 kg/m   General: Well-nourished, well-developed in no acute distress.  Eyes: No icterus. Conjunctivae pink. Mouth: Oropharyngeal mucosa moist and pink , no lesions erythema or exudate. Neck: Supple, Trachea midline Abdomen: Bowel sounds are normal, nontender, nondistended, no hepatosplenomegaly or masses, no abdominal  bruits or hernia , no rebound or guarding.   Extremities: No lower extremity edema. No clubbing or deformities. Neuro: Alert and oriented x 3.  Grossly intact. Skin: Warm and dry, no jaundice.   Psych: Alert and cooperative, normal mood and affect.   Labs: CMP     Component Value Date/Time   NA 138 01/28/2020 1604   NA 132 (L) 11/22/2013 1126   K 5.1 01/28/2020 1604   K 4.6 11/22/2013 1126   CL 102 01/28/2020 1604   CL 95 (L) 11/22/2013 1126   CO2 23 01/28/2020 1604   CO2 28 11/22/2013 1126   GLUCOSE 190 (H) 01/28/2020 1604   GLUCOSE 139 (H) 12/11/2019 1647   GLUCOSE 537 (HH) 11/22/2013 1126   BUN 18 01/28/2020 1604   BUN 13 11/22/2013 1126   CREATININE 1.27 (H) 01/28/2020 1604   CREATININE 1.00 11/22/2013 1126   CALCIUM 9.4 01/28/2020 1604   CALCIUM 8.6 11/22/2013 1126   PROT 7.1 12/10/2019 1652   PROT 7.5 11/22/2013 1126   ALBUMIN 4.5 12/10/2019 1652   ALBUMIN 3.9 11/22/2013 1126   AST 20 12/10/2019 1652   AST 14 (L) 11/22/2013 1126   ALT 36 (H) 12/10/2019 1652   ALT 20 11/22/2013 1126   ALKPHOS 71 12/10/2019 1652   ALKPHOS 72 11/22/2013 1126   BILITOT 0.8 12/10/2019 1652   BILITOT 0.5 11/22/2013 1126   GFRNONAA 43 (L) 01/28/2020 1604   GFRNONAA 60 (L) 11/22/2013 1126   GFRAA 50 (L) 01/28/2020 1604   GFRAA >60 11/22/2013 1126    Imaging Studies: No results found.  Assessment and Plan:   Jessica Montoya is a 69 y.o. y/o female here for follow-up of dysphagia with EGD brushings showing yeast  Complete therapy with fluconazole her dysphagia is improving.  If this does not completely improve after she finishes her fluconazole treatment, currently on day 6, she was advised to notify us and we can consider motility study.  Patient is reporting fatigue and I will obtain CBC and iron labs for further evaluation  Repeat colonoscopy in 1 year due to previous history of colon cancer and multiple polyps removed on last procedure  Follow-up with PCP closely for  further work-up of fatigue  Dr Vonda Antigua

## 2020-02-22 ENCOUNTER — Telehealth: Payer: Medicare Other

## 2020-02-22 ENCOUNTER — Ambulatory Visit: Payer: Self-pay | Admitting: General Practice

## 2020-02-22 NOTE — Chronic Care Management (AMB) (Signed)
  Chronic Care Management   Outreach Note  02/22/2020 Name: Jessica Montoya MRN: [id number] DOB: 1951-05-14  Referred by: [name], NP Reason for referral : Chronic Care Management (Follow up for DM2/CKD/Depression/HTN/HLD and other concerns)   An unsuccessful telephone outreach was attempted today. The patient was referred to the case management team for assistance with care management and care coordination.   Follow Up Plan: A HIPPA compliant phone message was left for the patient providing contact information and requesting a return call.   Noreene Larsson RN, MSN, [facility] Mobile: [phone]

## 2020-02-25 DIAGNOSIS — E113393 Type 2 diabetes mellitus with moderate nonproliferative diabetic retinopathy without macular edema, bilateral: Secondary | ICD-10-CM | POA: Diagnosis not present

## 2020-02-25 LAB — HM DIABETES EYE EXAM

## 2020-02-26 ENCOUNTER — Telehealth: Payer: Self-pay

## 2020-02-26 NOTE — Telephone Encounter (Signed)
-----   Message from Virgel Manifold, MD sent at 02/21/2020  1:06 PM EDT ----- Herb Grays please let the patient know, her lab work showed normal hemoglobin, and iron levels.  Follow-up with her primary care doctor for further evaluation of fatigue

## 2020-02-26 NOTE — Telephone Encounter (Signed)
Pt notified of lab results. Pt has a follow up with her PCP on 03/10/20 to discuss fatigue.

## 2020-03-10 ENCOUNTER — Ambulatory Visit: Payer: Medicare Other | Admitting: Nurse Practitioner

## 2020-03-10 DIAGNOSIS — Z794 Long term (current) use of insulin: Secondary | ICD-10-CM | POA: Diagnosis not present

## 2020-03-10 DIAGNOSIS — E11649 Type 2 diabetes mellitus with hypoglycemia without coma: Secondary | ICD-10-CM | POA: Diagnosis not present

## 2020-03-10 DIAGNOSIS — E1122 Type 2 diabetes mellitus with diabetic chronic kidney disease: Secondary | ICD-10-CM | POA: Diagnosis not present

## 2020-03-10 DIAGNOSIS — E1129 Type 2 diabetes mellitus with other diabetic kidney complication: Secondary | ICD-10-CM | POA: Diagnosis not present

## 2020-03-10 DIAGNOSIS — E1142 Type 2 diabetes mellitus with diabetic polyneuropathy: Secondary | ICD-10-CM | POA: Diagnosis not present

## 2020-03-10 LAB — HEMOGLOBIN A1C: Hemoglobin A1C: 6.1

## 2020-03-12 ENCOUNTER — Encounter: Payer: Self-pay | Admitting: Nurse Practitioner

## 2020-03-12 ENCOUNTER — Other Ambulatory Visit: Payer: Self-pay

## 2020-03-12 ENCOUNTER — Ambulatory Visit (INDEPENDENT_AMBULATORY_CARE_PROVIDER_SITE_OTHER): Payer: Medicare Other | Admitting: Nurse Practitioner

## 2020-03-12 DIAGNOSIS — R42 Dizziness and giddiness: Secondary | ICD-10-CM | POA: Diagnosis not present

## 2020-03-12 NOTE — Assessment & Plan Note (Signed)
Ongoing dizziness and weakness, with worsening post Covid.  Will continue medication changes per Dr. [name] recently, will also hold Ramipril at this time due to lower BP readings (although offers kidney function, will hold for now). Have advised her to see if can get in sooner to cardiology and if unable then maintain current appointment, may benefit from repeat Echo post Covid.  Referrals today to ENT and PT.  No new labs today, has had multiple lab draws recently and refuses today.  Recommend continue to drink plenty of fluids daily and rest.  Return to office in 6 weeks or sooner if worsening symptoms.

## 2020-03-12 NOTE — Patient Instructions (Signed)

## 2020-03-12 NOTE — Progress Notes (Signed)
BP 95/63   Pulse 93   Temp 97.7 F (36.5 C) (Oral)   Wt 160 lb 12.8 oz (72.9 kg)   LMP  (LMP Unknown)   BMI 25.95 kg/m    Subjective:    Patient ID: Huey Bienenstock, female    DOB: Sep 30, 1951, 69 y.o.   MRN: [id number]  HPI: Aalaiyah Yassin is a 69 y.o. female  Chief Complaint  Patient presents with  . Fatigue    6 week f/up- pt states Dr. [name] has her holding different meds for a week to see if they are causing her to feel bad. Currently holding gabapentin   DIZZINESS AND WEAKNESS Presents for follow-up for ongoing fatigue, this presented prior to Covid, then Covid enhanced this.  Did see GI recently due to concern for return of Colon CA -- had colonoscopy and polyps were removed that were benign, has to return in one year.  Saw Dr. [name] with endo Monday and is holding Gabapentin to see if this is causing fatigue and then next week to hold Trazodone.  A1C at recent visit was 6.1%.  Has ongoing dizziness where feels like things are spinning and this has been ongoing since Covid.  Dizziness comes on at anytime, no particular event starts it.  When it is present feels some nausea.  Has hearing loss in left ear at baseline.  Denies tinnitus or ear pain.    Recent labs in May show no anemia, TSH in March 1.413.  She is taking Ramipril 2.5 MG for kidney protection -- BP has been running on lower side.  Last echo -- EF 60-65%, no abnormalities.  Is to see Dr. Fletcher Anon next July 1st.  Endorses drinking adequate fluid at home. Duration:  chronic Severity: moderate  Onset: gradual Context when symptoms started:  unknown Symptoms improve with rest: yes  Depressive symptoms: no Stress/anxiety: no Insomnia: yes hard to fall asleep -- at times, but overall sleeping better Snoring: no Observed apnea by bed partner: no Daytime hypersomnolence:no Wakes feeling refreshed: no History of sleep study: no Dysnea on exertion:  no Orthopnea/PND: no -- sleeps on one pillow Chest pain:  no Chronic cough: no Lower extremity edema: no Arthralgias:no Myalgias: no Weakness: yes Rash: no  Relevant past medical, surgical, family and social history reviewed and updated as indicated. Interim medical history since our last visit reviewed. Allergies and medications reviewed and updated.  Review of Systems  Constitutional: Positive for fatigue. Negative for activity change, appetite change, diaphoresis and fever.  Respiratory: Negative for cough, chest tightness and shortness of breath.   Cardiovascular: Negative for chest pain, palpitations and leg swelling.  Gastrointestinal: Positive for nausea. Negative for abdominal distention, abdominal pain, constipation, diarrhea and vomiting.  Endocrine: Negative for polydipsia, polyphagia and polyuria.  Neurological: Positive for dizziness. Negative for syncope, weakness, light-headedness, numbness and headaches.  Psychiatric/Behavioral: Negative.     Per HPI unless specifically indicated above     Objective:    BP 95/63   Pulse 93   Temp 97.7 F (36.5 C) (Oral)   Wt 160 lb 12.8 oz (72.9 kg)   LMP  (LMP Unknown)   BMI 25.95 kg/m   Wt Readings from Last 3 Encounters:  03/12/20 160 lb 12.8 oz (72.9 kg)  02/20/20 161 lb 12.8 oz (73.4 kg)  02/05/20 163 lb (73.9 kg)    Physical Exam Vitals and nursing note reviewed.  Constitutional:      General: She is awake. She is not in acute  distress.    Appearance: She is well-developed and well-groomed. She is not ill-appearing.  HENT:     Head: Normocephalic.     Right Ear: Hearing, tympanic membrane, ear canal and external ear normal.     Left Ear: Hearing, tympanic membrane, ear canal and external ear normal.  Eyes:     General: Lids are normal.        Right eye: No discharge.        Left eye: No discharge.     Conjunctiva/sclera: Conjunctivae normal.     Pupils: Pupils are equal, round, and reactive to light.  Neck:     Thyroid: No thyromegaly.     Vascular: No carotid  bruit.  Cardiovascular:     Rate and Rhythm: Normal rate and regular rhythm.     Heart sounds: Normal heart sounds. No murmur. No gallop.   Pulmonary:     Effort: Pulmonary effort is normal. No accessory muscle usage or respiratory distress.     Breath sounds: Normal breath sounds.  Abdominal:     General: Bowel sounds are normal.     Palpations: Abdomen is soft.     Tenderness: There is no abdominal tenderness.  Musculoskeletal:     Cervical back: Normal range of motion and neck supple.     Right lower leg: No edema.     Left lower leg: No edema.  Skin:    General: Skin is warm and dry.  Neurological:     Mental Status: She is alert and oriented to person, place, and time.     Cranial Nerves: Cranial nerves are intact.     Motor: Weakness (slightlt weak grip and slow sit to stand) present.     Coordination: Coordination is intact.     Gait: Gait is intact.     Deep Tendon Reflexes: Reflexes are normal and symmetric.     Reflex Scores:      Brachioradialis reflexes are 2+ on the right side and 2+ on the left side.      Patellar reflexes are 2+ on the right side and 2+ on the left side.    Comments: Uses cane to assist gait.  Psychiatric:        Attention and Perception: Attention normal.        Mood and Affect: Mood normal.        Speech: Speech normal.        Behavior: Behavior normal. Behavior is cooperative.     Results for orders placed or performed in visit on 02/27/20  HM DIABETES EYE EXAM  Result Value Ref Range   HM Diabetic Eye Exam Retinopathy (A) No Retinopathy      Assessment & Plan:   Problem List Items Addressed This Visit      Other   Dizziness    Ongoing dizziness and weakness, with worsening post Covid.  Will continue medication changes per Dr. [name] recently, will also hold Ramipril at this time due to lower BP readings (although offers kidney function, will hold for now). Have advised her to see if can get in sooner to cardiology and if unable then  maintain current appointment, may benefit from repeat Echo post Covid.  Referrals today to ENT and PT.  No new labs today, has had multiple lab draws recently and refuses today.  Recommend continue to drink plenty of fluids daily and rest.  Return to office in 6 weeks or sooner if worsening symptoms.      Relevant Orders  Ambulatory referral to ENT   Ambulatory referral to Physical Therapy       Follow up plan: Return in about 6 weeks (around 04/23/2020) for Dizziness and fatigue + T2DM, HTN/HLD.

## 2020-03-13 ENCOUNTER — Other Ambulatory Visit: Payer: Self-pay | Admitting: Nurse Practitioner

## 2020-03-18 ENCOUNTER — Telehealth: Payer: Self-pay | Admitting: Nurse Practitioner

## 2020-03-18 NOTE — Telephone Encounter (Signed)
Form was bought in for placard disabilty to be filled out. Form placed in bin to be reviewed and signed.

## 2020-03-18 NOTE — Telephone Encounter (Signed)
Patient notified that form was ready to be picked up.

## 2020-03-18 NOTE — Telephone Encounter (Signed)
In your folder for signature 

## 2020-03-18 NOTE — Telephone Encounter (Signed)
I have signed this, can you alert her it is ready for pick-up.:)  thank you

## 2020-03-19 DIAGNOSIS — Z794 Long term (current) use of insulin: Secondary | ICD-10-CM | POA: Diagnosis not present

## 2020-03-19 DIAGNOSIS — E1142 Type 2 diabetes mellitus with diabetic polyneuropathy: Secondary | ICD-10-CM | POA: Diagnosis not present

## 2020-03-19 DIAGNOSIS — E11649 Type 2 diabetes mellitus with hypoglycemia without coma: Secondary | ICD-10-CM | POA: Diagnosis not present

## 2020-03-19 DIAGNOSIS — E1129 Type 2 diabetes mellitus with other diabetic kidney complication: Secondary | ICD-10-CM | POA: Diagnosis not present

## 2020-04-03 ENCOUNTER — Ambulatory Visit: Payer: Medicare Other | Admitting: Cardiovascular Disease

## 2020-04-03 ENCOUNTER — Other Ambulatory Visit: Payer: Self-pay

## 2020-04-03 ENCOUNTER — Encounter: Payer: Self-pay | Admitting: Cardiovascular Disease

## 2020-04-03 VITALS — BP 108/60 | HR 87 | Ht 66.0 in | Wt 158.0 lb

## 2020-04-03 DIAGNOSIS — I10 Essential (primary) hypertension: Secondary | ICD-10-CM | POA: Diagnosis not present

## 2020-04-03 DIAGNOSIS — E782 Mixed hyperlipidemia: Secondary | ICD-10-CM | POA: Diagnosis not present

## 2020-04-03 DIAGNOSIS — R0602 Shortness of breath: Secondary | ICD-10-CM | POA: Diagnosis not present

## 2020-04-03 NOTE — Progress Notes (Signed)
Cardiology Office Note   Date:  04/03/2020   ID:  Jessica Montoya, DOB Aug 18, 1951, MRN [id number]  PCP:  [name], NP  Cardiologist:   Kathlyn Sacramento, MD   Chief Complaint  Patient presents with   Other    past due 6 month follow up. patient c.o Fatigue and SOB when doing things. Meds reviewed verbally with patient/       History of Present Illness: Jessica Montoya is a 69 y.o. female who is here today for follow-up visit regarding previous chest pain and shortness of breath.  She has multiple chronic medical conditions including type 2 diabetes, hyperlipidemia, hypertension, peripheral neuropathy and chronic kidney disease.  She had previous colon cancer in 2012 treated successfully with colectomy. She was seen in 2019 for fatigue, shortness of breath and chest tightness.  She had a Lexiscan Myoview that showed no evidence of ischemia with normal ejection fraction.  Echocardiogram showed an EF of 60 to 65% with no significant valvular abnormalities. She had COVID-19 infection earlier this year and she reports being sick for few months but she did not require hospitalization.  She was given outpatient infusion.  Since then, she experienced increased fatigue and exertional dyspnea.  No recent chest pain.   Past Medical History:  Diagnosis Date   Anxiety    Arthritis    knees   Chronic pain    CKD (chronic kidney disease), stage II    Depression    Diabetes mellitus without complication ([place])    Diabetic neuropathy ([place])    Fatigue    a. 05/2018 Echo: EF 60-65%, no rwma, nl RV fxn; b. 05/2018 Lexiscan MV: EF>65%. No ischemia/infarct.   Hyperlipidemia    Hypertension    Insomnia    Long term current use of insulin ([facility]CC)    Peripheral neuropathy    Vertigo    2x/month   Wears dentures    full upper and lower    Past Surgical History:  Procedure Laterality Date   ABDOMINAL HYSTERECTOMY     CATARACT EXTRACTION W/PHACO Left 02/20/2019    Procedure: CATARACT EXTRACTION PHACO AND INTRAOCULAR LENS PLACEMENT (IOC) LEFT DIABETES;  Surgeon: Birder Robson, MD;  Location: [facility];  Service: Ophthalmology;  Laterality: Left;   CATARACT EXTRACTION W/PHACO Right 03/27/2019   Procedure: CATARACT EXTRACTION PHACO AND INTRAOCULAR LENS PLACEMENT ([place])  RIGHT DIABETIC;  Surgeon: Birder Robson, MD;  Location: [facility];  Service: Ophthalmology;  Laterality: Right;  Diabetic - insulin   COLON SURGERY     COLONOSCOPY WITH PROPOFOL N/A 02/05/2020   Procedure: COLONOSCOPY WITH PROPOFOL;  Surgeon: Virgel Manifold, MD;  Location: [facility];  Service: Endoscopy;  Laterality: N/A;   ESOPHAGOGASTRODUODENOSCOPY (EGD) WITH PROPOFOL N/A 02/05/2020   Procedure: ESOPHAGOGASTRODUODENOSCOPY (EGD) WITH PROPOFOL;  Surgeon: Virgel Manifold, MD;  Location: [facility];  Service: Endoscopy;  Laterality: N/A;     Current Outpatient Medications  Medication Sig Dispense Refill   aspirin 81 MG tablet Take 81 mg by mouth daily.     Cholecalciferol (VITAMIN D PO) Take 2,000 Units by mouth daily.      Dulaglutide (TRULICITY [place]) Inject 1.5 mg into the skin once a week.     ferrous fumarate (HEMOCYTE - 106 MG FE) 325 (106 FE) MG TABS tablet Take 1 tablet by mouth daily.     FLUoxetine (PROZAC) 40 MG capsule Take 1 capsule (40 mg total) by mouth daily. 90 capsule 3   hydrOXYzine (VISTARIL) 25  MG capsule TAKE 1 CAPSULE (25 MG TOTAL) BY MOUTH 2 (TWO) TIMES DAILY AS NEEDED (DIZZINESS). 180 capsule 0   insulin regular human CONCENTRATED (HUMULIN R) 500 UNIT/ML kwikpen Inject 40 units at 8 AM. Inject 40 units at 2 PM. Inject 20 units at 8 PM.     Multiple Vitamin (MULTIVITAMIN) tablet Take 1 tablet by mouth daily.     omeprazole (PRILOSEC) 20 MG capsule Take 1 capsule (20 mg total) by mouth daily. 90 capsule 3   simvastatin (ZOCOR) 40 MG tablet Take 1 tablet (40 mg total) by mouth daily. 90 tablet 3   traZODone (DESYREL)  50 MG tablet Take 1 tablet (50 mg total) by mouth at bedtime. 90 tablet 3   vitamin B-12 (CYANOCOBALAMIN) 1000 MCG tablet Take 2,000 mcg by mouth daily.     gabapentin (NEURONTIN) 300 MG capsule Take 1 capsule (300 mg total) by mouth 2 (two) times daily. (Patient not taking: Reported on 03/12/2020) 180 capsule 2   ramipril (ALTACE) 2.5 MG capsule Take 1 capsule (2.5 mg total) by mouth daily. (Patient not taking: Reported on 04/03/2020) 90 capsule 3   No current facility-administered medications for this visit.    Allergies:   Compazine [prochlorperazine edisylate], Exenatide, and Insulin aspart    Social History:  The patient  reports that she has never smoked. She has never used smokeless tobacco. She reports that she does not drink alcohol and does not use drugs.   Family History:  The patient's family history includes Breast cancer (age of onset: 26) in her mother; Cancer in her father, maternal grandfather, maternal grandmother, mother, and paternal grandmother; Hypertension in her maternal grandmother; Stroke in her paternal grandfather.    ROS:  Please see the history of present illness.   Otherwise, review of systems are positive for none.   All other systems are reviewed and negative.    PHYSICAL EXAM: VS:  BP 108/60 (BP Location: Left Arm, Patient Position: Sitting, Cuff Size: Normal)    Pulse 87    Ht 5\' 6"  (1.676 m)    Wt 158 lb (71.7 kg)    LMP  (LMP Unknown)    SpO2 98%    BMI 25.50 kg/m  , BMI Body mass index is 25.5 kg/m. GEN: Well nourished, well developed, in no acute distress  HEENT: normal  Neck: no JVD, carotid bruits, or masses Cardiac: RRR; no murmurs, rubs, or gallops,no edema  Respiratory:  clear to auscultation bilaterally, normal work of breathing GI: soft, nontender, nondistended, + BS MS: no deformity or atrophy  Skin: warm and dry, no rash Neuro:  Strength and sensation are intact Psych: euthymic mood, full affect   EKG:  EKG is ordered today. The ekg  ordered today demonstrates normal sinus rhythm with left axis deviation.   Recent Labs: 12/10/2019: ALT 36 12/12/2019: Magnesium 1.8; TSH 1.413 01/28/2020: BUN 18; Creatinine, Ser 1.27; Potassium 5.1; Sodium 138 02/20/2020: Hemoglobin 12.3; Platelets 212    Lipid Panel    Component Value Date/Time   CHOL 171 12/10/2019 1652   CHOL 125 05/21/2015 1500   TRIG 157 (H) 12/10/2019 1652   TRIG 225 (H) 05/21/2015 1500   HDL 65 12/10/2019 1652   VLDL 45 (H) 05/21/2015 1500   LDLCALC 79 12/10/2019 1652      Wt Readings from Last 3 Encounters:  04/03/20 158 lb (71.7 kg)  03/12/20 160 lb 12.8 oz (72.9 kg)  02/20/20 161 lb 12.8 oz (73.4 kg)  PAD Screen 05/16/2018  Previous PAD dx? No  Previous surgical procedure? No  Pain with walking? No  Feet/toe relief with dangling? No  Painful, non-healing ulcers? No  Extremities discolored? No      ASSESSMENT AND PLAN:  1.  Exertional dyspnea and fatigue: Previous cardiac work-up in 2019 was unremarkable.  However, the patient had COVID-19 infection earlier this year and that is when her symptoms worsen.  COVID-19 myocardial involvement cannot be completely excluded.  I am going to obtain an echocardiogram to evaluate.  2.  Hyperlipidemia: Most recent lipid profile showed an LDL of 79.  Consider a target LDL of less than 70 given that she is diabetic.   Disposition:   FU with me as needed.  Signed,  Kathlyn Sacramento, MD  04/03/2020 4:35 PM    [place]

## 2020-04-03 NOTE — Patient Instructions (Signed)
Medication Instructions:  Your physician recommends that you continue on your current medications as directed. Please refer to the Current Medication list given to you today.  *If you need a refill on your cardiac medications before your next appointment, please call your pharmacy*   Lab Work: NONE If you have labs (blood work) drawn today and your tests are completely normal, you will receive your results only by: [name] MyChart Message (if you have MyChart) OR . A paper copy in the mail If you have any lab test that is abnormal or we need to change your treatment, we will call you to review the results.   Testing/Procedures: Your physician has requested that you have an echocardiogram. Echocardiography is a painless test that uses sound waves to create images of your heart. It provides your doctor with information about the size and shape of your heart and how well your heart's chambers and valves are working. This procedure takes approximately one hour. There are no restrictions for this procedure.     Follow-Up: At [facility], you and your health needs are our priority.  As part of our continuing mission to provide you with exceptional heart care, we have created designated Provider Care Teams.  These Care Teams include your primary Cardiologist (physician) and Advanced Practice Providers (APPs -  Physician Assistants and Nurse Practitioners) who all work together to provide you with the care you need, when you need it.  We recommend signing up for the patient portal called "MyChart".  Sign up information is provided on this After Visit Summary.  MyChart is used to connect with patients for Virtual Visits (Telemedicine).  Patients are able to view lab/test results, encounter notes, upcoming appointments, etc.  Non-urgent messages can be sent to your provider as well.   To learn more about what you can do with MyChart, go to [email or web address].    Your next appointment:   AS NEEDED    The format for your next appointment:   In Person  Provider:    You may see Kathlyn Sacramento, MD or one of the following Advanced Practice Providers on your designated Care Team:    Murray Hodgkins, NP  Christell Faith, PA-C  Marrianne Mood, PA-C

## 2020-04-09 ENCOUNTER — Ambulatory Visit: Payer: Self-pay | Admitting: General Practice

## 2020-04-09 ENCOUNTER — Telehealth: Payer: Medicare Other

## 2020-04-09 NOTE — Chronic Care Management (AMB) (Signed)
  Chronic Care Management   Outreach Note  04/09/2020 Name: Jessica Montoya MRN: [id number] DOB: [birth year]  Referred by: [name], NP Reason for referral : Chronic Care Management (Follow up call: 2nd attempt: RNCM Chronic disease management and care coordination needs )   A second unsuccessful telephone outreach was attempted today. The patient was referred to the case management team for assistance with care management and care coordination.   Follow Up Plan: A HIPPA compliant phone message was left for the patient providing contact information and requesting a return call.   Noreene Larsson RN, MSN, [facility] Mobile: [phone]

## 2020-04-19 DIAGNOSIS — E1142 Type 2 diabetes mellitus with diabetic polyneuropathy: Secondary | ICD-10-CM | POA: Diagnosis not present

## 2020-04-19 DIAGNOSIS — Z794 Long term (current) use of insulin: Secondary | ICD-10-CM | POA: Diagnosis not present

## 2020-04-19 DIAGNOSIS — E11649 Type 2 diabetes mellitus with hypoglycemia without coma: Secondary | ICD-10-CM | POA: Diagnosis not present

## 2020-04-19 DIAGNOSIS — E1129 Type 2 diabetes mellitus with other diabetic kidney complication: Secondary | ICD-10-CM | POA: Diagnosis not present

## 2020-04-22 ENCOUNTER — Telehealth: Payer: Self-pay

## 2020-04-22 ENCOUNTER — Ambulatory Visit: Payer: Self-pay | Admitting: Pharmacist

## 2020-04-22 NOTE — Chronic Care Management (AMB) (Signed)
  Chronic Care Management   Note  04/22/2020 Name: Margarite Vessel MRN: [id number] DOB: [birth year]  Herlinda Heady is a 69 y.o. year old female who is a primary care patient of [name], [name], NP. The CCM team was consulted for assistance with chronic disease management and care coordination needs.    Attempted to contact patient for medication management review. Left HIPAA compliant message for patient to return my call at their convenience.   Plan: - Will collaborate with Care Guide to outreach to schedule follow up with me  [name], PharmD, [place] [phone]

## 2020-04-23 ENCOUNTER — Encounter: Payer: Self-pay | Admitting: Nurse Practitioner

## 2020-04-23 ENCOUNTER — Other Ambulatory Visit: Payer: Self-pay

## 2020-04-23 ENCOUNTER — Ambulatory Visit (INDEPENDENT_AMBULATORY_CARE_PROVIDER_SITE_OTHER): Payer: Medicare Other | Admitting: Nurse Practitioner

## 2020-04-23 ENCOUNTER — Telehealth: Payer: Self-pay | Admitting: Nurse Practitioner

## 2020-04-23 VITALS — BP 104/65 | HR 88 | Temp 97.8°F

## 2020-04-23 DIAGNOSIS — I10 Essential (primary) hypertension: Secondary | ICD-10-CM

## 2020-04-23 DIAGNOSIS — E1159 Type 2 diabetes mellitus with other circulatory complications: Secondary | ICD-10-CM | POA: Diagnosis not present

## 2020-04-23 DIAGNOSIS — E1122 Type 2 diabetes mellitus with diabetic chronic kidney disease: Secondary | ICD-10-CM

## 2020-04-23 DIAGNOSIS — E1142 Type 2 diabetes mellitus with diabetic polyneuropathy: Secondary | ICD-10-CM | POA: Diagnosis not present

## 2020-04-23 DIAGNOSIS — E785 Hyperlipidemia, unspecified: Secondary | ICD-10-CM | POA: Diagnosis not present

## 2020-04-23 DIAGNOSIS — IMO0002 Reserved for concepts with insufficient information to code with codable children: Secondary | ICD-10-CM

## 2020-04-23 DIAGNOSIS — Z794 Long term (current) use of insulin: Secondary | ICD-10-CM | POA: Diagnosis not present

## 2020-04-23 DIAGNOSIS — E1165 Type 2 diabetes mellitus with hyperglycemia: Secondary | ICD-10-CM

## 2020-04-23 DIAGNOSIS — E1169 Type 2 diabetes mellitus with other specified complication: Secondary | ICD-10-CM | POA: Diagnosis not present

## 2020-04-23 DIAGNOSIS — R42 Dizziness and giddiness: Secondary | ICD-10-CM

## 2020-04-23 NOTE — Assessment & Plan Note (Addendum)
Chronic, ongoing, followed by endo.  A1C with 6% recently with endo, praised for success. Continue current regimen at this time, defer changes to endo, and collaboration with endo + CCM collaboration.   Recent urine micro ALB 30 and A:C 30-300.  Recheck BMP today and recommend if consistent BS lows to ensure she contacts endo of PCP.  Return in 6 months.

## 2020-04-23 NOTE — Assessment & Plan Note (Addendum)
Chronic, stable with BP below goal, monitor closely as on lower side.  Continue Altace for kidney protection.  Recommend she continue to monitor BP at home daily and document + focus on DASH diet.  BMP today. May consider discontinuation of Ramipril if lower BP readings noted consistently.  Return in 6 months.

## 2020-04-23 NOTE — Patient Instructions (Signed)

## 2020-04-23 NOTE — Progress Notes (Addendum)
BP 104/65   Pulse 88   Temp 97.8 F (36.6 C) (Oral)   LMP  (LMP Unknown)   SpO2 99%    Subjective:    Patient ID: Jessica Montoya, female    DOB: 22-Dec-1950, 69 y.o.   MRN: [id number]  HPI: Jessica Montoya is a 69 y.o. female  Chief Complaint  Patient presents with  . Fatigue    pt states she is still a little fatigued but nothing like she was before    DIABETES Is followed by Dr. [name], last saw her June 2021 with A1C 6%. Taking U500 25 units TID, she recently had this decreased due to lows with Trulicity 1.5 MG.  Has tried Metformin (GI upset), only oral medication she has tried (it is on last endo note as continuing to take, but patient reports not taking this ).  Is being followed by CCM team for assistance with insulin.   Hypoglycemic episodes: occasional after first two days after Trulicity Polydipsia/polyuria: no Visual disturbance: no Chest pain: no Paresthesias: no Glucose Monitoring: yes             Accucheck frequency: TID -- often in 100 range with Libre             Fasting glucose:              Post prandial:              Evening:              Before meals:  Taking Insulin?: yes             Long acting insulin: U 500 25 units TID             Short acting insulin: Blood Pressure Monitoring: rarely Retinal Examination: Not up to Date Foot Exam: Up to Date Pneumovax: Up to Date Influenza: Up to Date Aspirin: yes   HYPERTENSION / HYPERLIPIDEMIA Continues on Ramipril and Simvastatin.  Is seeing cardiologist Dr. Fletcher Anon, last 04/03/20, returns to see him on 05/08/20 for echo.   Satisfied with current treatment? yes Duration of hypertension: chronic BP monitoring frequency: rarely BP range: she can not remember BP medication side effects: no Duration of hyperlipidemia: chronic Cholesterol medication side effects: no Cholesterol supplements: none Medication compliance: good compliance Aspirin: no Recent stressors: no Recurrent headaches: no Visual  changes: no Palpitations: no Dyspnea: no Chest pain: no Lower extremity edema: no Dizzy/lightheaded: no  DIZZINESS AND WEAKNESS Reports symptoms have improved since stopping Gabapentin. Duration:  chronic Severity: moderate  Onset: gradual Context when symptoms started:  unknown Symptoms improve with rest: yes  Depressive symptoms: no Stress/anxiety: no Insomnia: yes hard to fall asleep -- at times, but overall sleeping better Snoring: no Observed apnea by bed partner: no Daytime hypersomnolence:no Wakes feeling refreshed: no History of sleep study: no Dysnea on exertion:  no Orthopnea/PND: no -- sleeps on one pillow Chest pain: no Chronic cough: no Lower extremity edema: no Arthralgias:no Myalgias: no Weakness: yes Rash: no  Relevant past medical, surgical, family and social history reviewed and updated as indicated. Interim medical history since our last visit reviewed. Allergies and medications reviewed and updated.  Review of Systems  Constitutional: Negative for activity change, appetite change, diaphoresis, fatigue and fever.  Respiratory: Negative for cough, chest tightness and shortness of breath.   Cardiovascular: Negative for chest pain, palpitations and leg swelling.  Gastrointestinal: Negative.   Endocrine: Negative for cold intolerance, heat intolerance, polydipsia, polyphagia and polyuria.  Neurological: Negative.   Psychiatric/Behavioral: Negative for decreased concentration, self-injury, sleep disturbance and suicidal ideas. The patient is not nervous/anxious.     Per HPI unless specifically indicated above     Objective:    BP 104/65   Pulse 88   Temp 97.8 F (36.6 C) (Oral)   LMP  (LMP Unknown)   SpO2 99%   Wt Readings from Last 3 Encounters:  04/03/20 158 lb (71.7 kg)  03/12/20 160 lb 12.8 oz (72.9 kg)  02/20/20 161 lb 12.8 oz (73.4 kg)    Physical Exam Vitals and nursing note reviewed.  Constitutional:      General: She is awake.  She is not in acute distress.    Appearance: She is well-developed and well-groomed. She is not ill-appearing.  HENT:     Head: Normocephalic.     Right Ear: Hearing, tympanic membrane, ear canal and external ear normal.     Left Ear: Hearing, tympanic membrane, ear canal and external ear normal.  Eyes:     General: Lids are normal.        Right eye: No discharge.        Left eye: No discharge.     Conjunctiva/sclera: Conjunctivae normal.     Pupils: Pupils are equal, round, and reactive to light.  Neck:     Thyroid: No thyromegaly.     Vascular: No carotid bruit.  Cardiovascular:     Rate and Rhythm: Normal rate and regular rhythm.     Heart sounds: Normal heart sounds. No murmur heard.  No gallop.   Pulmonary:     Effort: Pulmonary effort is normal. No accessory muscle usage or respiratory distress.     Breath sounds: Normal breath sounds.  Abdominal:     General: Bowel sounds are normal.     Palpations: Abdomen is soft.     Tenderness: There is no abdominal tenderness.  Musculoskeletal:     Cervical back: Normal range of motion and neck supple.     Right lower leg: No edema.     Left lower leg: No edema.  Skin:    General: Skin is warm and dry.  Neurological:     Mental Status: She is alert and oriented to person, place, and time.     Cranial Nerves: Cranial nerves are intact.     Coordination: Coordination is intact.     Gait: Gait is intact.     Deep Tendon Reflexes: Reflexes are normal and symmetric.     Reflex Scores:      Brachioradialis reflexes are 2+ on the right side and 2+ on the left side.      Patellar reflexes are 2+ on the right side and 2+ on the left side.    Comments: Uses cane to assist gait.  Psychiatric:        Attention and Perception: Attention normal.        Mood and Affect: Mood normal.        Speech: Speech normal.        Behavior: Behavior normal. Behavior is cooperative.     Results for orders placed or performed in visit on 02/27/20   HM DIABETES EYE EXAM  Result Value Ref Range   HM Diabetic Eye Exam Retinopathy (A) No Retinopathy      Assessment & Plan:   Problem List Items Addressed This Visit      Cardiovascular and Mediastinum   Hypertension associated with diabetes ([place])    Chronic, stable with  BP below goal, monitor closely as on lower side.  Continue Altace for kidney protection.  Recommend she continue to monitor BP at home daily and document + focus on DASH diet.  BMP today. May consider discontinuation of Ramipril if lower BP readings noted consistently.  Return in 6 months.      Relevant Orders   Basic metabolic panel     Endocrine   Diabetic neuropathy (HCC)    Chronic, ongoing, followed by endo.  A1C with 6% recently with endo, praised for success. Continue current regimen at this time, defer changes to endo, and collaboration with endo + CCM collaboration.  Maintain off Gabapentin at this time due to improved fatigue and dizziness without out.  Recheck BMP today and recommend if consistent BS lows to ensure she contacts endo of PCP.  Return in 6 months.      Hyperlipidemia associated with type 2 diabetes mellitus (HCC)    Chronic, ongoing.  Continue current medication regimen and adjust as needed.  Obtain lipid panel today.  Return in 6 months.      Relevant Orders   Lipid Panel w/o Chol/HDL Ratio   Uncontrolled type 2 diabetes mellitus with chronic kidney disease ([place]) - Primary    Chronic, ongoing, followed by endo.  A1C with 6% recently with endo, praised for success. Continue current regimen at this time, defer changes to endo, and collaboration with endo + CCM collaboration.   Recent urine micro ALB 30 and A:C 30-300.  Recheck BMP today and recommend if consistent BS lows to ensure she contacts endo of PCP.  Return in 6 months.        Other   Long-term insulin use (HCC)    Chronic, ongoing.  Continue collaboration with endocrinology.      Dizziness    Improved without Gabapentin on  board, continue without Gabapentin at this time.  If need to restart, then would start at lowest dose possible.  Return for worsening or ongoing.           Follow up plan: Return in about 6 months (around 10/24/2020) for T2DM, HTN/HLD, MOOD.

## 2020-04-23 NOTE — Assessment & Plan Note (Signed)
Improved without Gabapentin on board, continue without Gabapentin at this time.  If need to restart, then would start at lowest dose possible.  Return for worsening or ongoing.

## 2020-04-23 NOTE — Assessment & Plan Note (Addendum)
Chronic, ongoing.  Continue current medication regimen and adjust as needed.  Obtain lipid panel today.  Return in 6 months.

## 2020-04-23 NOTE — Assessment & Plan Note (Addendum)
Chronic, ongoing, followed by endo.  A1C with 6% recently with endo, praised for success. Continue current regimen at this time, defer changes to endo, and collaboration with endo + CCM collaboration.  Maintain off Gabapentin at this time due to improved fatigue and dizziness without out.  Recheck BMP today and recommend if consistent BS lows to ensure she contacts endo of PCP.  Return in 6 months.

## 2020-04-23 NOTE — Chronic Care Management (AMB) (Signed)
°  Care Management   Note  04/23/2020 Name: Royetta Probus MRN: [id number] DOB: Feb 15, 1951  Emanuelle Hammerstrom is a 69 y.o. year old female who is a primary care patient of [name], NP and is actively engaged with the care management team. I reached out to Huey Bienenstock by phone today to assist with re-scheduling a follow up visit with the Pharmacist  Follow up plan: Telephone appointment with care management team member scheduled for:06/17/2020  Noreene Larsson, [place], [place], [place] [address] Direct Dial: [phone] [email or web address]

## 2020-04-23 NOTE — Assessment & Plan Note (Signed)
Chronic, ongoing.  Continue collaboration with endocrinology.

## 2020-04-24 LAB — LIPID PANEL W/O CHOL/HDL RATIO
Cholesterol, Total: 114 mg/dL (ref 100–199)
HDL: 54 mg/dL (ref >39)
LDL Chol Calc (NIH): 37 mg/dL (ref 0–99)
Triglycerides: 130 mg/dL (ref 0–149)
VLDL Cholesterol Cal: 23 mg/dL (ref 5–40)

## 2020-04-24 LAB — BASIC METABOLIC PANEL
BUN/Creatinine Ratio: 9 — ABNORMAL LOW (ref 12–28)
BUN: 13 mg/dL (ref 8–27)
CO2: 20 mmol/L (ref 20–29)
Calcium: 9.3 mg/dL (ref 8.7–10.3)
Chloride: 101 mmol/L (ref 96–106)
Creatinine, Ser: 1.39 mg/dL — ABNORMAL HIGH (ref 0.57–1.00)
GFR calc Af Amer: 45 mL/min/{1.73_m2} — ABNORMAL LOW (ref >59)
GFR calc non Af Amer: 39 mL/min/{1.73_m2} — ABNORMAL LOW (ref >59)
Glucose: 277 mg/dL — ABNORMAL HIGH (ref 65–99)
Potassium: 4.2 mmol/L (ref 3.5–5.2)
Sodium: 141 mmol/L (ref 134–144)

## 2020-04-24 NOTE — Progress Notes (Signed)
Good morning, please let Providencia know her labs have returned. Cholesterol levels are at goal.  Continues to show some kidney disease, we will continue to monitor closely and if any decline we will consider referral to kidney doctor in future.  Have a great day!!

## 2020-05-08 ENCOUNTER — Other Ambulatory Visit: Payer: Medicare Other

## 2020-05-20 DIAGNOSIS — Z794 Long term (current) use of insulin: Secondary | ICD-10-CM | POA: Diagnosis not present

## 2020-05-20 DIAGNOSIS — E1129 Type 2 diabetes mellitus with other diabetic kidney complication: Secondary | ICD-10-CM | POA: Diagnosis not present

## 2020-05-20 DIAGNOSIS — E1142 Type 2 diabetes mellitus with diabetic polyneuropathy: Secondary | ICD-10-CM | POA: Diagnosis not present

## 2020-05-20 DIAGNOSIS — E11649 Type 2 diabetes mellitus with hypoglycemia without coma: Secondary | ICD-10-CM | POA: Diagnosis not present

## 2020-05-21 ENCOUNTER — Telehealth: Payer: Self-pay | Admitting: General Practice

## 2020-05-21 ENCOUNTER — Telehealth: Payer: Medicare Other

## 2020-05-21 NOTE — Telephone Encounter (Cosign Needed)
  Chronic Care Management   Outreach Note  05/21/2020 Name: Jessica Montoya MRN: [id number] DOB: Jun 26, 1951  Referred by: [name], NP Reason for referral : Appointment (RNCM Follow up call: 3rd attempt Chronic Disease management and Care Coordination Needs)   Third unsuccessful telephone outreach was attempted today. The patient was referred to the case management team for assistance with care management and care coordination. The patient's primary care provider has been notified of our unsuccessful attempts to make or maintain contact with the patient. The care management team is pleased to engage with this patient at any time in the future should he/she be interested in assistance from the care management team. The use of jabber when calling the patient and still no return call.  Follow Up Plan: The care management team is available to follow up with the patient after provider conversation with the patient regarding recommendation for care management engagement and subsequent re-referral to the care management team.   Noreene Larsson RN, MSN, [facility] Mobile: [phone]

## 2020-05-27 ENCOUNTER — Telehealth: Payer: Self-pay | Admitting: *Deleted

## 2020-05-27 NOTE — Chronic Care Management (AMB) (Signed)
  Chronic Care Management   Note  05/27/2020 Name: Jessica Montoya MRN: [id number] DOB: Apr 02, 1951  Jessica Montoya is a 69 y.o. year old female who is a primary care patient of [name], NP and is actively engaged with the care management team. I reached out to Huey Bienenstock by phone today to assist with re-scheduling a follow up visit with the Pharmacist.  Follow up plan: Unsuccessful telephone outreach attempt made. A HIPPA compliant phone message was left for the patient providing contact information and requesting a return call. The care management team will reach out to the patient again over the next 7 days. If patient returns call to provider office, please advise to call [place] at [phone].  [place] Management

## 2020-06-03 NOTE — Chronic Care Management (AMB) (Signed)
  Chronic Care Management   Note  06/03/2020 Name: [name] MRN: [id number] DOB: 22-Sep-1951  Jessica Montoya is a 69 y.o. year old female who is a primary care patient of [name], NP and is actively engaged with the care management team. I reached out to Huey Bienenstock by phone today to assist with re-scheduling a follow up visit with the Pharmacist.  Follow up plan: A second unsuccessful telephone outreach attempt made. A HIPPA compliant phone message was left for the patient providing contact information and requesting a return call. The care management team will reach out to the patient again over the next 7 days. If patient returns call to provider office, please advise to call [place] at [phone].  [place] Management

## 2020-06-11 NOTE — Chronic Care Management (AMB) (Signed)
  Chronic Care Management   Note  06/11/2020 Name: Lavaun Greenfield MRN: [id number] DOB: Nov 09, 1950  Pari Lombard is a 69 y.o. year old female who is a primary care patient of [name], NP and is actively engaged with the care management team. I reached out to Huey Bienenstock by phone today to assist with re-scheduling a follow up visit with the Pharmacist.  Follow up plan: Telephone appointment with care management team member scheduled for: 07/14/2020  [place] Management

## 2020-06-17 ENCOUNTER — Telehealth: Payer: Medicare Other

## 2020-06-19 ENCOUNTER — Other Ambulatory Visit: Payer: Self-pay | Admitting: Nurse Practitioner

## 2020-06-20 DIAGNOSIS — E11649 Type 2 diabetes mellitus with hypoglycemia without coma: Secondary | ICD-10-CM | POA: Diagnosis not present

## 2020-06-20 DIAGNOSIS — E1142 Type 2 diabetes mellitus with diabetic polyneuropathy: Secondary | ICD-10-CM | POA: Diagnosis not present

## 2020-06-20 DIAGNOSIS — E1129 Type 2 diabetes mellitus with other diabetic kidney complication: Secondary | ICD-10-CM | POA: Diagnosis not present

## 2020-06-20 DIAGNOSIS — Z794 Long term (current) use of insulin: Secondary | ICD-10-CM | POA: Diagnosis not present

## 2020-07-14 ENCOUNTER — Telehealth: Payer: Medicare Other

## 2020-07-14 NOTE — Chronic Care Management (AMB) (Deleted)
   Chronic Care Management Pharmacy  Name: Jessica Montoya  MRN: [id number] DOB: [birth year]   Chief Complaint/ HPI  Jessica Montoya,  69 y.o. , female presents for their Follow-Up CCM visit with the clinical pharmacist via telephone.  PCP : [name], NP Patient Care Team: [name], NP as PCP - General (Nurse Practitioner) Wellington Hampshire, MD as PCP - Cardiology (Cardiology) Vladimir Crofts, MD (Neurology) Wellington Hampshire, MD as Consulting Physician (Cardiology) [name] Betsey Holiday, MD as Physician Assistant (Endocrinology) Vanita Ingles, RN as Case Manager ([place]) Vladimir Faster, [facility] (Pharmacist)  Their chronic conditions include: {USCCMDZASSESSMENTOPTIONS:23563}   Office Visits:04/23/20-  Jessica Guarneri, NP - blood work, d/c gabapentin , eGFr 5  Consult Visit:   Allergies  Allergen Reactions  . Compazine [Prochlorperazine Edisylate] Rash  . Exenatide Nausea Only  . Insulin Aspart Nausea Only    Medications: Outpatient Encounter Medications as of 07/14/2020  Medication Sig  . aspirin 81 MG tablet Take 81 mg by mouth daily.  . Cholecalciferol (VITAMIN D PO) Take 2,000 Units by mouth daily.   . Dulaglutide (TRULICITY [place]) Inject 1.5 mg into the skin once a week.  . ferrous fumarate (HEMOCYTE - 106 MG FE) 325 (106 FE) MG TABS tablet Take 1 tablet by mouth daily.  [name] FLUoxetine (PROZAC) 40 MG capsule Take 1 capsule (40 mg total) by mouth daily.  . hydrOXYzine (VISTARIL) 25 MG capsule TAKE 1 CAPSULE (25 MG TOTAL) BY MOUTH 2 (TWO) TIMES DAILY AS NEEDED (DIZZINESS).  [name] insulin regular human CONCENTRATED (HUMULIN R) 500 UNIT/ML kwikpen Inject 40 units at 8 AM. Inject 40 units at 2 PM. Inject 20 units at 8 PM.  . Multiple Vitamin (MULTIVITAMIN) tablet Take 1 tablet by mouth daily.  [name] omeprazole (PRILOSEC) 20 MG capsule Take 1 capsule (20 mg total) by mouth daily.  . ramipril (ALTACE) 2.5 MG capsule Take 1 capsule (2.5 mg total) by mouth daily.  .  simvastatin (ZOCOR) 40 MG tablet TAKE 1 TABLET BY MOUTH EVERY DAY  . traZODone (DESYREL) 50 MG tablet Take 1 tablet (50 mg total) by mouth at bedtime.  . vitamin B-12 (CYANOCOBALAMIN) 1000 MCG tablet Take 2,000 mcg by mouth daily.   No facility-administered encounter medications on file as of 07/14/2020.    Wt Readings from Last 3 Encounters:  04/03/20 158 lb (71.7 kg)  03/12/20 160 lb 12.8 oz (72.9 kg)  02/20/20 161 lb 12.8 oz (73.4 kg)    Current Diagnosis/Assessment:    Goals Addressed   None     {CHL [facility] Diagnosis/Assessment:[phone]}   Medication Management   Pt uses *** pharmacy for all medications Uses pill box? {Yes or If no, why not?:20788} Pt endorses ***% compliance  We discussed: {Pharmacy options:24294}  Plan  {US Pharmacy SYPZ:58063}    Follow up: *** month phone visit  ***

## 2020-07-17 DIAGNOSIS — S42211A Unspecified displaced fracture of surgical neck of right humerus, initial encounter for closed fracture: Secondary | ICD-10-CM | POA: Diagnosis not present

## 2020-07-17 DIAGNOSIS — M25521 Pain in right elbow: Secondary | ICD-10-CM | POA: Diagnosis not present

## 2020-07-21 DIAGNOSIS — E1142 Type 2 diabetes mellitus with diabetic polyneuropathy: Secondary | ICD-10-CM | POA: Diagnosis not present

## 2020-07-21 DIAGNOSIS — Z794 Long term (current) use of insulin: Secondary | ICD-10-CM | POA: Diagnosis not present

## 2020-07-21 DIAGNOSIS — E11649 Type 2 diabetes mellitus with hypoglycemia without coma: Secondary | ICD-10-CM | POA: Diagnosis not present

## 2020-07-21 DIAGNOSIS — E1129 Type 2 diabetes mellitus with other diabetic kidney complication: Secondary | ICD-10-CM | POA: Diagnosis not present

## 2020-07-25 DIAGNOSIS — S42211D Unspecified displaced fracture of surgical neck of right humerus, subsequent encounter for fracture with routine healing: Secondary | ICD-10-CM | POA: Diagnosis not present

## 2020-08-05 ENCOUNTER — Other Ambulatory Visit: Payer: Self-pay | Admitting: Nurse Practitioner

## 2020-08-05 NOTE — Telephone Encounter (Signed)
Requested medication (s) are due for refill today: Yes  Requested medication (s) are on the active medication list: Yes  Last refill: 07/30/19  Future visit scheduled: Yes  Notes to clinic:  Unable to refill, expired Rx     Requested Prescriptions  Pending Prescriptions Disp Refills   ramipril (ALTACE) 2.5 MG capsule [Pharmacy Med Name: RAMIPRIL 2.5 MG CAPSULE] 90 capsule 3    Sig: TAKE 1 CAPSULE BY MOUTH EVERY DAY      Cardiovascular:  ACE Inhibitors Failed - 08/05/2020 11:02 AM      Failed - Cr in normal range and within 180 days    Creatinine  Date Value Ref Range Status  11/22/2013 1.00 0.60 - 1.30 mg/dL Final   Creatinine, Ser  Date Value Ref Range Status  04/23/2020 1.39 (H) 0.57 - 1.00 mg/dL Final   Creatinine, Urine  Date Value Ref Range Status  12/11/2019 81 mg/dL Final    Comment:    Performed at [facility], [address] [place], [place] [address]          Passed - K in normal range and within 180 days    Potassium  Date Value Ref Range Status  04/23/2020 4.2 3.5 - 5.2 mmol/L Final  11/22/2013 4.6 3.5 - 5.1 mmol/L Final          Passed - Patient is not pregnant      Passed - Last BP in normal range    BP Readings from Last 1 Encounters:  04/23/20 104/65          Passed - Valid encounter within last 6 months    Recent Outpatient Visits           3 months ago Uncontrolled type 2 diabetes mellitus with chronic kidney disease ([place])   [place], [place] T, NP   4 months ago Dizziness   [place], [place] T, NP   6 months ago Uncontrolled type 2 diabetes mellitus with chronic kidney disease ([place])   [place], Jolene T, NP   7 months ago Type 2 diabetes mellitus with microalbuminuria, with long-term current use of insulin ([place])   [place], Jolene T, NP   9 months ago Uncontrolled type 2 diabetes mellitus with chronic kidney disease ([place])    [place], [name], NP       Future Appointments             In 2 months [name], [name], NP [employer]   In 3 months  [employer] [facility]

## 2020-08-08 ENCOUNTER — Other Ambulatory Visit: Payer: Self-pay | Admitting: Gastroenterology

## 2020-08-15 DIAGNOSIS — S42211D Unspecified displaced fracture of surgical neck of right humerus, subsequent encounter for fracture with routine healing: Secondary | ICD-10-CM | POA: Diagnosis not present

## 2020-08-21 DIAGNOSIS — E1129 Type 2 diabetes mellitus with other diabetic kidney complication: Secondary | ICD-10-CM | POA: Diagnosis not present

## 2020-08-21 DIAGNOSIS — E1142 Type 2 diabetes mellitus with diabetic polyneuropathy: Secondary | ICD-10-CM | POA: Diagnosis not present

## 2020-08-21 DIAGNOSIS — E11649 Type 2 diabetes mellitus with hypoglycemia without coma: Secondary | ICD-10-CM | POA: Diagnosis not present

## 2020-08-21 DIAGNOSIS — Z794 Long term (current) use of insulin: Secondary | ICD-10-CM | POA: Diagnosis not present

## 2020-08-26 DIAGNOSIS — R809 Proteinuria, unspecified: Secondary | ICD-10-CM | POA: Diagnosis not present

## 2020-08-26 DIAGNOSIS — Z794 Long term (current) use of insulin: Secondary | ICD-10-CM | POA: Diagnosis not present

## 2020-08-26 DIAGNOSIS — E1129 Type 2 diabetes mellitus with other diabetic kidney complication: Secondary | ICD-10-CM | POA: Diagnosis not present

## 2020-08-26 LAB — HEMOGLOBIN A1C: Hemoglobin A1C: 5.8

## 2020-09-01 DIAGNOSIS — R809 Proteinuria, unspecified: Secondary | ICD-10-CM | POA: Diagnosis not present

## 2020-09-01 DIAGNOSIS — E1129 Type 2 diabetes mellitus with other diabetic kidney complication: Secondary | ICD-10-CM | POA: Diagnosis not present

## 2020-09-01 DIAGNOSIS — Z794 Long term (current) use of insulin: Secondary | ICD-10-CM | POA: Diagnosis not present

## 2020-09-04 DIAGNOSIS — S42211D Unspecified displaced fracture of surgical neck of right humerus, subsequent encounter for fracture with routine healing: Secondary | ICD-10-CM | POA: Diagnosis not present

## 2020-09-05 DIAGNOSIS — E1122 Type 2 diabetes mellitus with diabetic chronic kidney disease: Secondary | ICD-10-CM | POA: Diagnosis not present

## 2020-09-05 DIAGNOSIS — Z8781 Personal history of (healed) traumatic fracture: Secondary | ICD-10-CM | POA: Diagnosis not present

## 2020-09-05 DIAGNOSIS — E1142 Type 2 diabetes mellitus with diabetic polyneuropathy: Secondary | ICD-10-CM | POA: Diagnosis not present

## 2020-09-05 DIAGNOSIS — E1129 Type 2 diabetes mellitus with other diabetic kidney complication: Secondary | ICD-10-CM | POA: Diagnosis not present

## 2020-09-05 DIAGNOSIS — M8588 Other specified disorders of bone density and structure, other site: Secondary | ICD-10-CM | POA: Diagnosis not present

## 2020-09-09 ENCOUNTER — Other Ambulatory Visit: Payer: Self-pay | Admitting: Nurse Practitioner

## 2020-09-22 DIAGNOSIS — M25511 Pain in right shoulder: Secondary | ICD-10-CM | POA: Diagnosis not present

## 2020-09-22 DIAGNOSIS — M25611 Stiffness of right shoulder, not elsewhere classified: Secondary | ICD-10-CM | POA: Diagnosis not present

## 2020-09-24 DIAGNOSIS — E1142 Type 2 diabetes mellitus with diabetic polyneuropathy: Secondary | ICD-10-CM | POA: Diagnosis not present

## 2020-09-24 DIAGNOSIS — Z794 Long term (current) use of insulin: Secondary | ICD-10-CM | POA: Diagnosis not present

## 2020-09-24 DIAGNOSIS — E1129 Type 2 diabetes mellitus with other diabetic kidney complication: Secondary | ICD-10-CM | POA: Diagnosis not present

## 2020-09-24 DIAGNOSIS — E11649 Type 2 diabetes mellitus with hypoglycemia without coma: Secondary | ICD-10-CM | POA: Diagnosis not present

## 2020-10-23 ENCOUNTER — Other Ambulatory Visit: Payer: Self-pay | Admitting: Gastroenterology

## 2020-10-23 ENCOUNTER — Other Ambulatory Visit: Payer: Self-pay | Admitting: Nurse Practitioner

## 2020-10-24 ENCOUNTER — Ambulatory Visit: Payer: Medicare Other | Admitting: Nurse Practitioner

## 2020-10-30 ENCOUNTER — Telehealth: Payer: Self-pay | Admitting: Pharmacist

## 2020-10-31 NOTE — Progress Notes (Signed)
Chronic Care Management Pharmacy Assistant   Name: Jessica Montoya  MRN: [id number] DOB: [birth year]  Reason for Encounter: Initial Questions for PharmD Appointment Scheduled for 11-14-2020   PCP : [name], NP  Allergies:   Allergies  Allergen Reactions  . Compazine [Prochlorperazine Edisylate] Rash  . Exenatide Nausea Only  . Insulin Aspart Nausea Only    Medications: Outpatient Encounter Medications as of 10/30/2020  Medication Sig  . aspirin 81 MG tablet Take 81 mg by mouth daily.  . Cholecalciferol (VITAMIN D PO) Take 2,000 Units by mouth daily.   . Dulaglutide (TRULICITY [place]) Inject 1.5 mg into the skin once a week.  . ferrous fumarate (HEMOCYTE - 106 MG FE) 325 (106 FE) MG TABS tablet Take 1 tablet by mouth daily.  [name] FLUoxetine (PROZAC) 40 MG capsule Take 1 capsule (40 mg total) by mouth daily.  . hydrOXYzine (VISTARIL) 25 MG capsule TAKE 1 CAPSULE (25 MG TOTAL) BY MOUTH 2 (TWO) TIMES DAILY AS NEEDED (DIZZINESS).  [name] insulin regular human CONCENTRATED (HUMULIN R) 500 UNIT/ML kwikpen Inject 40 units at 8 AM. Inject 40 units at 2 PM. Inject 20 units at 8 PM.  . Multiple Vitamin (MULTIVITAMIN) tablet Take 1 tablet by mouth daily.  [name] omeprazole (PRILOSEC) 20 MG capsule TAKE 1 CAPSULE BY MOUTH EVERY DAY  . ramipril (ALTACE) 2.5 MG capsule TAKE 1 CAPSULE BY MOUTH EVERY DAY  . simvastatin (ZOCOR) 40 MG tablet TAKE 1 TABLET BY MOUTH EVERY DAY  . traZODone (DESYREL) 50 MG tablet Take 1 tablet (50 mg total) by mouth at bedtime.  . vitamin B-12 (CYANOCOBALAMIN) 1000 MCG tablet Take 2,000 mcg by mouth daily.   No facility-administered encounter medications on file as of 10/30/2020.    Current Diagnosis: Patient Active Problem List   Diagnosis Date Noted  . Vitamin D deficiency 12/10/2019  . Vitamin B12 deficiency 12/10/2019  . History of colon cancer 12/10/2019  . History of COVID-19 10/19/2019  . Chronic bilateral low back pain with bilateral sciatica 04/17/2019   . Background retinopathy 12/04/2018  . Osteoarthritis 11/09/2017  . Chronic kidney disease, stage 3 ([place]) 05/02/2017  . Advanced care planning/counseling discussion 11/24/2016  . Insomnia 07/02/2015  . Depression 05/21/2015  . Hypertension associated with diabetes ([place]) 05/21/2015  . Diabetic neuropathy ([place]) 05/21/2015  . Hyperlipidemia associated with type 2 diabetes mellitus ([place]) 05/21/2015  . Chronic pain syndrome 05/21/2015  . GERD (gastroesophageal reflux disease) 05/21/2015  . Uncontrolled type 2 diabetes mellitus with chronic kidney disease ([place]) 05/21/2015  . Long-term insulin use ([place]) 08/16/2014  . Type 2 diabetes mellitus with microalbuminuria [facility]) 08/16/2014    10-30-2020:  Have you seen any other providers recently? YES  Per chart review:   09-05-2020: Office Visit with : [facility] Dr. Della Goo.  Orders and instructions given at MD visit were:  Encouraged to check blood sugars four times daily.    Continue to use [employer] [place] monitoring system. DME order was sent to [employer] [facility] [employer].    Continue Trulicity 0.75mg /0.35mL: Inject 0.5 mLs (0.75 mg total) subcutaneously once a week.   Continue REGULAR HUMULIN R U-500 "CONCENTRATED" 500 unit/mL: Inject 40 units at 8 AM. Inject 40 units at 2 PM. Inject 20 units at 8 PM.     DEXA scan ordered related to osteoporosis based on recent wrist fracture.(07-25-2020 DX of closed fracture of upper end humerus (right).  Previous A1C Readings:   08/26/2020: 5.8   03/10/2020: 6.1     Any  changes in your medications or health? Patient stated, " NO".,   Any side effects from any medications? Patient reported NO adverse reactions to medications.   Do you have an symptoms or problems not managed by your medications? None reported.   Any concerns about your health right now? None reported.  Has your provider asked that you check blood pressure, blood sugar, or follow special diet at home?  Yes, per  chart review at last endocrinology visit was instructed to check blood sugars QID. Per patient report on 1.27.2022 she checks her blood sugars twice daily  and as needed.   Do you get any type of exercise on a regular basis? NO  Can you think of a goal you would like to reach for your health?  During our phone conversation on 10-30-2020 she could not think of one.   Do you have any problems getting your medications?   Patient reported that her Trulicity and Humulin R U-500 are expensive.Per the patient; [facility] applied for patient assistance with [facility] to obtain both medications.     Is there anything that you would like to discuss during the appointment?   Patient stated "no".   10-30-2020:  Throughout the conversation, the patient was noted to be pleasant and cooperative. She did express she wanted to be " a part of chronic services".  She reported that she is checking her blood sugars twice daily and as needed. Reported her blood sugars have been ranging from 150 to 190.  In the past month, she reported 4 episodes of blood sugars reading 60. She reported hypoglycemic symptoms of dizziness. She continued to explain that when her readings are "low" she takes " 2 sugar tabs" and she will be "ok". When I asked about her low blood sugars she did report she does eat dinner every night and checks her blood sugar before her 8 PM dose of Humulin 25 units. She reported that most of the time her blood sugars before her 8 pm dose of Humulin are ranging from 170-190.    Reported no adverse reactions with her current medication regimen. She currently has all of her medications that are on her medication profile in the chart. She reported she does NOT need any other financial assistance with obtaining her other medications.   Confirmed with the patient her scheduled appointment with PharmD Birdena Crandall on 11-14-2020 at 10: 37 AM. Gave the patient my direct line and encouraged her to call  me if she had any questions or concerns. The patient confirmed my telephone number back.   Cloretta Ned, LPN Clinical Pharmacist Assistant  [phone]     Follow-Up:  Scheduled Follow-Up With Clinical Pharmacist on 11/14/2020

## 2020-10-31 NOTE — Progress Notes (Addendum)
Chronic Care Management Pharmacy Assistant   Name: Viviane Semidey  MRN: [id number] DOB: September 16, 1951  Reason for Encounter: Patient Assistance Applications/ Initial Questions    PCP : [name], NP  Allergies:   Allergies  Allergen Reactions   Compazine [Prochlorperazine Edisylate] Rash   Exenatide Nausea Only   Insulin Aspart Nausea Only    Medications: Outpatient Encounter Medications as of 10/30/2020  Medication Sig   aspirin 81 MG tablet Take 81 mg by mouth daily.   Cholecalciferol (VITAMIN D PO) Take 2,000 Units by mouth daily.    Dulaglutide (TRULICITY [place]) Inject 1.5 mg into the skin once a week.   ferrous fumarate (HEMOCYTE - 106 MG FE) 325 (106 FE) MG TABS tablet Take 1 tablet by mouth daily.   FLUoxetine (PROZAC) 40 MG capsule Take 1 capsule (40 mg total) by mouth daily.   hydrOXYzine (VISTARIL) 25 MG capsule TAKE 1 CAPSULE (25 MG TOTAL) BY MOUTH 2 (TWO) TIMES DAILY AS NEEDED (DIZZINESS).   insulin regular human CONCENTRATED (HUMULIN R) 500 UNIT/ML kwikpen Inject 40 units at 8 AM. Inject 40 units at 2 PM. Inject 20 units at 8 PM.   Multiple Vitamin (MULTIVITAMIN) tablet Take 1 tablet by mouth daily.   omeprazole (PRILOSEC) 20 MG capsule TAKE 1 CAPSULE BY MOUTH EVERY DAY   ramipril (ALTACE) 2.5 MG capsule TAKE 1 CAPSULE BY MOUTH EVERY DAY   simvastatin (ZOCOR) 40 MG tablet TAKE 1 TABLET BY MOUTH EVERY DAY   traZODone (DESYREL) 50 MG tablet Take 1 tablet (50 mg total) by mouth at bedtime.   vitamin B-12 (CYANOCOBALAMIN) 1000 MCG tablet Take 2,000 mcg by mouth daily.   No facility-administered encounter medications on file as of 10/30/2020.    Current Diagnosis: Patient Active Problem List   Diagnosis Date Noted   Vitamin D deficiency 12/10/2019   Vitamin B12 deficiency 12/10/2019   History of colon cancer 12/10/2019   History of COVID-19 10/19/2019   Chronic bilateral low back pain with bilateral sciatica 04/17/2019   Background retinopathy 12/04/2018    Osteoarthritis 11/09/2017   Chronic kidney disease, stage 3 ([place]) 05/02/2017   Advanced care planning/counseling discussion 11/24/2016   Insomnia 07/02/2015   Depression 05/21/2015   Hypertension associated with diabetes ([place]) 05/21/2015   Diabetic neuropathy ([place]) 05/21/2015   Hyperlipidemia associated with type 2 diabetes mellitus ([place]) 05/21/2015   Chronic pain syndrome 05/21/2015   GERD (gastroesophageal reflux disease) 05/21/2015   Uncontrolled type 2 diabetes mellitus with chronic kidney disease ([place]) 05/21/2015   Long-term insulin use ([place]) 08/16/2014   Type 2 diabetes mellitus with microalbuminuria ([place]) 08/16/2014    10-30-2020: The patient reached out to the office staff at [facility] related to her patient assistance applications for her diabetic medications, she requested a callback. The request was sent to PharmD Birdena Crandall for review and to complete. In reviewing her chart it was noted that the patient has had a history of "no showing" her chronic care appointments due to not answering her phone or returning messages for call back.  Called the patient's preferred line. The patient answered the phone, she was noted to be pleasant and cooperative throughout the phone conversation. The patient explained that she was almost out of her medication Trulicity. She continued to explain that her endocrinologist [facility]) applied for patient assistance for her Trulicity and Humulin R. She stated she reached out to [facility] to request a medication refill. Per [facility] they stated they had  not received an application on her behalf. She then reached back out to  [facility] who explained they did fax the application and was able to give the patient a confirmation number. The patient stated, " I'm tired of going back and forth and I'm wondering if you can assist me with these applications, or do you have samples of Trulicity?"     Explained to the patient I would reach out to [facility] to follow up on her request. I also explained that we do NOT have any samples of the medication she is needed.   10-30-2020 at 1: 15 pm: [facility] and left a message on the voicemail for the "clinical staff" per recording. Requested same day call back if possible.   10-30-2020 at 5:00 pm. No call back was received from [facility] End[facility]in[facility], updated PharmD Birdena Crandall on the situation before the close of business.    10-31-2020: [facility] and spoke with Anderson Malta about the patient's request for assistance. Anderson Malta explained they have been actively working on this situation with [employer] but no resolution has been made. Anderson Malta requested if I could pre-fill 2 patient assistance applications for the patient's Trulicity and Humulin R and fax them to their office. Pre-filled both patient assistance applications as requested. Faxed to [facility] and received a confirmation of delivery fax.  [facility] to confirm receipt of the fax. Anderson Malta reported she did receive the fax and would "take care of the rest". She explained she would reach out to the patient today to give her an update.   Updated PharmD Birdena Crandall on above.   Cloretta Ned, LPN Clinical Pharmacist Assistant  [phone]      Follow-Up:  Pharmacist Review  I have reviewed the care management and care coordination activities outlined in this encounter and I am certifying that I agree with the content of this note.   Junita Push. Kenton Kingfisher PharmD, [facility] [phone]

## 2020-11-01 ENCOUNTER — Encounter: Payer: Self-pay | Admitting: Nurse Practitioner

## 2020-11-04 ENCOUNTER — Ambulatory Visit (INDEPENDENT_AMBULATORY_CARE_PROVIDER_SITE_OTHER): Payer: Medicare Other | Admitting: Nurse Practitioner

## 2020-11-04 ENCOUNTER — Encounter: Payer: Self-pay | Admitting: Nurse Practitioner

## 2020-11-04 ENCOUNTER — Other Ambulatory Visit: Payer: Self-pay

## 2020-11-04 VITALS — BP 110/69 | HR 76 | Temp 98.0°F | Ht 65.75 in | Wt 152.2 lb

## 2020-11-04 DIAGNOSIS — E1122 Type 2 diabetes mellitus with diabetic chronic kidney disease: Secondary | ICD-10-CM | POA: Diagnosis not present

## 2020-11-04 DIAGNOSIS — E785 Hyperlipidemia, unspecified: Secondary | ICD-10-CM

## 2020-11-04 DIAGNOSIS — N1832 Chronic kidney disease, stage 3b: Secondary | ICD-10-CM | POA: Diagnosis not present

## 2020-11-04 DIAGNOSIS — E1129 Type 2 diabetes mellitus with other diabetic kidney complication: Secondary | ICD-10-CM

## 2020-11-04 DIAGNOSIS — M85851 Other specified disorders of bone density and structure, right thigh: Secondary | ICD-10-CM | POA: Diagnosis not present

## 2020-11-04 DIAGNOSIS — R809 Proteinuria, unspecified: Secondary | ICD-10-CM

## 2020-11-04 DIAGNOSIS — IMO0002 Reserved for concepts with insufficient information to code with codable children: Secondary | ICD-10-CM

## 2020-11-04 DIAGNOSIS — F331 Major depressive disorder, recurrent, moderate: Secondary | ICD-10-CM

## 2020-11-04 DIAGNOSIS — E1142 Type 2 diabetes mellitus with diabetic polyneuropathy: Secondary | ICD-10-CM | POA: Diagnosis not present

## 2020-11-04 DIAGNOSIS — E1169 Type 2 diabetes mellitus with other specified complication: Secondary | ICD-10-CM

## 2020-11-04 DIAGNOSIS — I152 Hypertension secondary to endocrine disorders: Secondary | ICD-10-CM

## 2020-11-04 DIAGNOSIS — E1165 Type 2 diabetes mellitus with hyperglycemia: Secondary | ICD-10-CM

## 2020-11-04 DIAGNOSIS — E1159 Type 2 diabetes mellitus with other circulatory complications: Secondary | ICD-10-CM

## 2020-11-04 DIAGNOSIS — Z794 Long term (current) use of insulin: Secondary | ICD-10-CM

## 2020-11-04 NOTE — Assessment & Plan Note (Signed)
Chronic, ongoing.  Continue current medication regimen and adjust as needed.  Obtain lipid panel today.  Return in 6 months.

## 2020-11-04 NOTE — Assessment & Plan Note (Signed)
Chronic, stable with BP below goal, monitor closely as on lower side.  Continue Altace for kidney protection.  Recommend she continue to monitor BP at home daily and document + focus on DASH diet.  CMP today. May consider discontinuation of Ramipril if lower BP readings noted consistently, although benefits from low dose for kidney protection.  Return in 6 months.

## 2020-11-04 NOTE — Assessment & Plan Note (Signed)
Chronic, ongoing. Scores stable.   Will continue Prozac 40 MG and continue Trazodone 50 MG every night.  Denies SI/HI. Return in 6 months.  Adjust medication as needed and monitor NA+ level.

## 2020-11-04 NOTE — Assessment & Plan Note (Signed)
Chronic, ongoing.  Is scheduled for repeat DEXA in March.  Continue Vitamin D supplement and recheck Vit D level today.  Collaborate with endo on changes in future if needed.

## 2020-11-04 NOTE — Assessment & Plan Note (Signed)
Chronic, ongoing.  Continue collaboration with endocrinology.

## 2020-11-04 NOTE — Assessment & Plan Note (Signed)
Chronic, ongoing with T2DM.  Continue Ramipril for kidney protection and check CMP today.  Consider referral to nephrology if any decline in function.

## 2020-11-04 NOTE — Assessment & Plan Note (Signed)
Chronic, ongoing, followed by endo.  A1C with 5.8% recently with endo, praised for success. Continue current regimen at this time, defer changes to endo, and collaboration with endo + CCM collaboration.  Maintain off Gabapentin at this time due to improved fatigue and dizziness without out.  CMP today.  Return in 6 months.

## 2020-11-04 NOTE — Patient Instructions (Signed)
Osteopenia  Osteopenia is a loss of thickness (density) inside the bones. Another name for osteopenia is low bone mass. Mild osteopenia is a normal part of aging. It is not a disease, and it does not cause symptoms. However, if you have osteopenia and continue to lose bone mass, you could develop a condition that causes the bones to become thin and break more easily (osteoporosis). Osteoporosis can cause you to lose some height, have back pain, and have a stooped posture. Although osteopenia is not a disease, making changes to your lifestyle and diet can help to prevent osteopenia from developing into osteoporosis. What are the causes? Osteopenia is caused by loss of calcium in the bones. Bones are constantly changing. Old bone cells are continually being replaced with new bone cells. This process builds new bone. The mineral calcium is needed to build new bone and maintain bone density. Bone density is usually highest around age 35. After that, most people's bodies cannot replace all the bone they have lost with new bone. What increases the risk? You are more likely to develop this condition if:  You are older than age 50.  You are a woman who went through menopause early.  You have a long illness that keeps you in bed.  You do not get enough exercise.  You lack certain nutrients (malnutrition).  You have an overactive thyroid gland (hyperthyroidism).  You use products that contain nicotine or tobacco, such as cigarettes, e-cigarettes and chewing tobacco, or you drink a lot of alcohol.  You are taking medicines that weaken the bones, such as steroids. What are the signs or symptoms? This condition does not cause any symptoms. You may have a slightly higher risk for bone breaks (fractures), so getting fractures more easily than normal may be an indication of osteopenia. How is this diagnosed? This condition may be diagnosed based on an X-ray exam that measures bone density (dual-energy  X-ray absorptiometry, or DEXA). This test can measure bone density in your hips, spine, and wrists. Osteopenia has no symptoms, so this condition is usually diagnosed after a routine bone density screening test is done for osteoporosis. This routine screening is usually done for:  Women who are age 65 or older.  Men who are age 70 or older. If you have risk factors for osteopenia, you may have the screening test at an earlier age. How is this treated? Making dietary and lifestyle changes can lower your risk for osteoporosis. If you have severe osteopenia that is close to becoming osteoporosis, this condition can be treated with medicines and dietary supplements such as calcium and vitamin D. These supplements help to rebuild bone density. Follow these instructions at home: Eating and drinking Eat a diet that is high in calcium and vitamin D.  Calcium is found in dairy products, beans, salmon, and leafy green vegetables like spinach and broccoli.  Look for foods that have vitamin D and calcium added to them (fortified foods), such as orange juice, cereal, and bread.   Lifestyle  Do 30 minutes or more of a weight-bearing exercise every day, such as walking, jogging, or playing a sport. These types of exercises strengthen the bones.  Do not use any products that contain nicotine or tobacco, such as cigarettes, e-cigarettes, and chewing tobacco. If you need help quitting, ask your health care provider.  Do not drink alcohol if: ? Your health care provider tells you not to drink. ? You are pregnant, may be pregnant, or are planning to become   pregnant.  If you drink alcohol: ? Limit how much you use to:  0-1 drink a day for women.  0-2 drinks a day for men. ? Be aware of how much alcohol is in your drink. In the [place] one drink equals one 12 oz bottle of beer (355 mL), one 5 oz glass of wine (148 mL), or one 1 oz glass of hard liquor (44 mL). General instructions  Take over-the-counter  and prescription medicines only as told by your health care provider. These include vitamins and supplements.  Take precautions at home to lower your risk of falling, such as: ? Keeping rooms well-lit and free of clutter, such as cords. ? Installing safety rails on stairs. ? Using rubber mats in the bathroom or other areas that are often wet or slippery.  Keep all follow-up visits. This is important. Contact a health care provider if:  You have not had a bone density screening for osteoporosis and you are: ? A woman who is age 65 or older. ? A man who is age 70 or older.  You are a postmenopausal woman who has not had a bone density screening for osteoporosis.  You are older than age 50 and you want to know if you should have bone density screening for osteoporosis. Summary  Osteopenia is a loss of thickness (density) inside the bones. Another name for osteopenia is low bone mass.  Osteopenia is not a disease, but it may increase your risk for a condition that causes the bones to become thin and break more easily (osteoporosis).  You may be at risk for osteopenia if you are older than age 50 or if you are a woman who went through early menopause.  Osteopenia does not cause any symptoms, but it can be diagnosed with a bone density screening test.  Dietary and lifestyle changes are the first treatment for osteopenia. These may lower your risk for osteoporosis. This information is not intended to replace advice given to you by your health care provider. Make sure you discuss any questions you have with your health care provider. Document Revised: 03/06/2020 Document Reviewed: 03/06/2020 Elsevier Patient Education  2021 Elsevier Inc.  

## 2020-11-04 NOTE — Assessment & Plan Note (Signed)
Chronic, ongoing, followed by endo.  A1C with 5.8% recently with endo, praised for success. Continue current regimen at this time, defer changes to endo, and collaboration with endo + CCM collaboration.   Recent urine ALB 10 with endo. CMP today.  Return in 6 months.

## 2020-11-04 NOTE — Assessment & Plan Note (Addendum)
Chronic, ongoing, followed by endo.  A1C with 5.8% recently with endo, praised for success. Continue current regimen at this time, defer changes to endo, and collaboration with endo + CCM collaboration.   Recent urine micro in November with endo was 10.  CMP today.  Return in 6 months.

## 2020-11-04 NOTE — Progress Notes (Signed)
BP 110/69   Pulse 76   Temp 98 F (36.7 C) (Oral)   Ht 5' 5.75" (1.67 m)   Wt 152 lb 3.2 oz (69 kg)   LMP  (LMP Unknown)   SpO2 98%   BMI 24.75 kg/m    Subjective:    Patient ID: Jessica Montoya, female    DOB: 10-04-1951, 70 y.o.   MRN: [id number]  HPI: Jessica Montoya is a 70 y.o. female  Chief Complaint  Patient presents with  . Diabetes  . Hypertension  . Hyperlipidemia  . Mood   DIABETES Is followed by Dr. [name], last saw in December 2021 -- 09/05/20 -- A1c 5.8% and urine ALB 10 -- sees her next in March. Taking U500 25 units in morning and then 2 PM none, and at night 25 units QHS, she recently had this decreased due to lows with Trulicity 1.5 MG.  Has tried Metformin (GI upset), only oral medication she has tried (it is on last endo note as continuing to take, but patient reports not taking this ).  Is being followed by CCM team for assistance with insulin.   Hypoglycemic episodes: improving, not as many lows Polydipsia/polyuria: no Visual disturbance: no Chest pain: no Paresthesias: no Glucose Monitoring: yes             Accucheck frequency: TID with Libre             Fasting glucose: this morning 190 -- 170-180 average             Post prandial:              Evening: 180 range at night             Before meals:  Taking Insulin?: yes             Long acting insulin: as noted above             Short acting insulin: Blood Pressure Monitoring: rarely Retinal Examination: Up to Date Foot Exam: Up to Date Pneumovax: Up to Date Influenza: Refused Aspirin: yes   HYPERTENSION / HYPERLIPIDEMIA Continues on Ramipril and Simvastatin.  Is seeing cardiologist Dr. Fletcher Anon, last 04/03/20. Satisfied with current treatment? yes Duration of hypertension: chronic BP monitoring frequency: rarely BP range: 110-120/70's BP medication side effects: no Duration of hyperlipidemia: chronic Cholesterol medication side effects: no Cholesterol supplements: none Medication  compliance: good compliance Aspirin: no Recent stressors: no Recurrent headaches: no Visual changes: no Palpitations: no Dyspnea: no Chest pain: no Lower extremity edema: no Dizzy/lightheaded: no  OSTEOPENIA Had recent fall broke left knee and right arm -- is scheduled for DEXA in March -- history of osteopenia, but concern for advancement to osteoporosis -- Dr. [name] ordered DEXA. Satisfied with current treatment?: yes Past osteoporosis medications/treatments: none Adequate calcium & vitamin D: yes Intolerance to bisphosphonates: none Weight bearing exercises: yes  DEPRESSION Continues on Trazodone and Prozac. Mood status: stable Satisfied with current treatment?: yes Symptom severity: mild  Duration of current treatment : chronic Side effects: no Medication compliance: good compliance Psychotherapy/counseling: none Depressed mood: no Anxious mood: no Anhedonia: no Significant weight loss or gain: no Insomnia: yes hard to fall asleep Fatigue: no Feelings of worthlessness or guilt: no Impaired concentration/indecisiveness: no Suicidal ideations: no Hopelessness: no Crying spells: no Depression screen [facility] 2/9 11/04/2020 01/28/2020 01/16/2020 12/10/2019 11/28/2019  Decreased Interest 0 1 0 0 0  Down, Depressed, Hopeless 0 1 0 2 1  PHQ - 2 Score  0 2 0 2 1  Altered sleeping 0 2 - 3 -  Tired, decreased energy 0 3 - 3 -  Change in appetite 0 1 - 0 -  Feeling bad or failure about yourself  0 0 - 0 -  Trouble concentrating 0 0 - 0 -  Moving slowly or fidgety/restless 0 0 - 0 -  Suicidal thoughts 0 0 - 0 -  PHQ-9 Score 0 8 - 8 -  Difficult doing work/chores - Not difficult at all - Somewhat difficult -  Some recent data might be hidden    Relevant past medical, surgical, family and social history reviewed and updated as indicated. Interim medical history since our last visit reviewed. Allergies and medications reviewed and updated.  Review of Systems  Constitutional:  Negative for activity change, appetite change, diaphoresis, fatigue and fever.  Respiratory: Negative for cough, chest tightness and shortness of breath.   Cardiovascular: Negative for chest pain, palpitations and leg swelling.  Gastrointestinal: Negative.   Endocrine: Negative for cold intolerance, heat intolerance, polydipsia, polyphagia and polyuria.  Neurological: Negative.   Psychiatric/Behavioral: Negative for decreased concentration, self-injury, sleep disturbance and suicidal ideas. The patient is not nervous/anxious.     Per HPI unless specifically indicated above     Objective:    BP 110/69   Pulse 76   Temp 98 F (36.7 C) (Oral)   Ht 5' 5.75" (1.67 m)   Wt 152 lb 3.2 oz (69 kg)   LMP  (LMP Unknown)   SpO2 98%   BMI 24.75 kg/m   Wt Readings from Last 3 Encounters:  11/04/20 152 lb 3.2 oz (69 kg)  04/03/20 158 lb (71.7 kg)  03/12/20 160 lb 12.8 oz (72.9 kg)    Physical Exam Vitals and nursing note reviewed.  Constitutional:      General: She is awake. She is not in acute distress.    Appearance: She is well-developed and well-groomed. She is not ill-appearing.  HENT:     Head: Normocephalic.     Right Ear: Hearing, tympanic membrane, ear canal and external ear normal.     Left Ear: Hearing, tympanic membrane, ear canal and external ear normal.  Eyes:     General: Lids are normal.        Right eye: No discharge.        Left eye: No discharge.     Conjunctiva/sclera: Conjunctivae normal.     Pupils: Pupils are equal, round, and reactive to light.  Neck:     Thyroid: No thyromegaly.     Vascular: No carotid bruit.  Cardiovascular:     Rate and Rhythm: Normal rate and regular rhythm.     Heart sounds: Normal heart sounds. No murmur heard. No gallop.   Pulmonary:     Effort: Pulmonary effort is normal. No accessory muscle usage or respiratory distress.     Breath sounds: Normal breath sounds.  Abdominal:     General: Bowel sounds are normal.     Palpations:  Abdomen is soft.     Tenderness: There is no abdominal tenderness.  Musculoskeletal:     Cervical back: Normal range of motion and neck supple.     Right lower leg: No edema.     Left lower leg: No edema.  Skin:    General: Skin is warm and dry.  Neurological:     Mental Status: She is alert and oriented to person, place, and time.     Cranial Nerves: Cranial nerves  are intact.     Coordination: Coordination is intact.     Gait: Gait is intact.     Deep Tendon Reflexes: Reflexes are normal and symmetric.     Reflex Scores:      Brachioradialis reflexes are 2+ on the right side and 2+ on the left side.      Patellar reflexes are 2+ on the right side and 2+ on the left side.    Comments: Uses cane to assist gait.  Psychiatric:        Attention and Perception: Attention normal.        Mood and Affect: Mood normal.        Speech: Speech normal.        Behavior: Behavior normal. Behavior is cooperative.     Results for orders placed or performed in visit on 09/18/20  Hemoglobin A1c  Result Value Ref Range   Hemoglobin A1C 6.1   Hemoglobin A1c  Result Value Ref Range   Hemoglobin A1C 5.8       Assessment & Plan:   Problem List Items Addressed This Visit      Cardiovascular and Mediastinum   Hypertension associated with diabetes ([place])    Chronic, stable with BP below goal, monitor closely as on lower side.  Continue Altace for kidney protection.  Recommend she continue to monitor BP at home daily and document + focus on DASH diet.  CMP today. May consider discontinuation of Ramipril if lower BP readings noted consistently, although benefits from low dose for kidney protection.  Return in 6 months.      Relevant Orders   Comprehensive metabolic panel   TSH     Endocrine   Diabetic neuropathy (HCC)    Chronic, ongoing, followed by endo.  A1C with 5.8% recently with endo, praised for success. Continue current regimen at this time, defer changes to endo, and collaboration with  endo + CCM collaboration.  Maintain off Gabapentin at this time due to improved fatigue and dizziness without out.  CMP today.  Return in 6 months.      Hyperlipidemia associated with type 2 diabetes mellitus (HCC)    Chronic, ongoing.  Continue current medication regimen and adjust as needed.  Obtain lipid panel today.  Return in 6 months.      Relevant Orders   Lipid Panel w/o Chol/HDL Ratio   Uncontrolled type 2 diabetes mellitus with chronic kidney disease ([place]) - Primary    Chronic, ongoing, followed by endo.  A1C with 5.8% recently with endo, praised for success. Continue current regimen at this time, defer changes to endo, and collaboration with endo + CCM collaboration.   Recent urine ALB 10 with endo. CMP today.  Return in 6 months.      Type 2 diabetes mellitus with microalbuminuria (HCC)    Chronic, ongoing, followed by endo.  A1C with 5.8% recently with endo, praised for success. Continue current regimen at this time, defer changes to endo, and collaboration with endo + CCM collaboration.   Recent urine micro in November with endo was 10.  CMP today.  Return in 6 months.        Musculoskeletal and Integument   Osteopenia    Chronic, ongoing.  Is scheduled for repeat DEXA in March.  Continue Vitamin D supplement and recheck Vit D level today.  Collaborate with endo on changes in future if needed.      Relevant Orders   VITAMIN D 25 Hydroxy (Vit-D Deficiency, Fractures)  Genitourinary   Chronic kidney disease, stage 3 (HCC)    Chronic, ongoing with T2DM.  Continue Ramipril for kidney protection and check CMP today.  Consider referral to nephrology if any decline in function.        Other   Depression    Chronic, ongoing. Scores stable.   Will continue Prozac 40 MG and continue Trazodone 50 MG every night.  Denies SI/HI. Return in 6 months.  Adjust medication as needed and monitor NA+ level.      Long-term insulin use (HCC)    Chronic, ongoing.  Continue  collaboration with endocrinology.          Follow up plan: Return in about 6 months (around 05/04/2021) for T2DM, HTN/HLD, OSTEOPENIA, MOOD.

## 2020-11-05 LAB — COMPREHENSIVE METABOLIC PANEL
ALT: 17 IU/L (ref 0–32)
AST: 20 IU/L (ref 0–40)
Albumin/Globulin Ratio: 1.7 (ref 1.2–2.2)
Albumin: 4.2 g/dL (ref 3.8–4.8)
Alkaline Phosphatase: 74 IU/L (ref 44–121)
BUN/Creatinine Ratio: 11 — ABNORMAL LOW (ref 12–28)
BUN: 13 mg/dL (ref 8–27)
Bilirubin Total: 0.6 mg/dL (ref 0.0–1.2)
CO2: 27 mmol/L (ref 20–29)
Calcium: 9.6 mg/dL (ref 8.7–10.3)
Chloride: 105 mmol/L (ref 96–106)
Creatinine, Ser: 1.15 mg/dL — ABNORMAL HIGH (ref 0.57–1.00)
GFR calc Af Amer: 56 mL/min/{1.73_m2} — ABNORMAL LOW (ref >59)
GFR calc non Af Amer: 48 mL/min/{1.73_m2} — ABNORMAL LOW (ref >59)
Globulin, Total: 2.5 g/dL (ref 1.5–4.5)
Glucose: 155 mg/dL — ABNORMAL HIGH (ref 65–99)
Potassium: 4.3 mmol/L (ref 3.5–5.2)
Sodium: 144 mmol/L (ref 134–144)
Total Protein: 6.7 g/dL (ref 6.0–8.5)

## 2020-11-05 LAB — LIPID PANEL W/O CHOL/HDL RATIO
Cholesterol, Total: 128 mg/dL (ref 100–199)
HDL: 66 mg/dL (ref >39)
LDL Chol Calc (NIH): 42 mg/dL (ref 0–99)
Triglycerides: 114 mg/dL (ref 0–149)
VLDL Cholesterol Cal: 20 mg/dL (ref 5–40)

## 2020-11-05 LAB — TSH: TSH: 1.08 u[IU]/mL (ref 0.450–4.500)

## 2020-11-05 LAB — VITAMIN D 25 HYDROXY (VIT D DEFICIENCY, FRACTURES): Vit D, 25-Hydroxy: 27.8 ng/mL — ABNORMAL LOW (ref 30.0–100.0)

## 2020-11-05 NOTE — Progress Notes (Signed)
Please let [name] know her labs have returned.  Cholesterol levels remain at goal, continue current statin.  Kidneys continue to show some stage 3 kidney disease with no decline.  We will continue to monitor closely and send to a kidney doctor if ever any decline in function presents, keep up with good control of diabetes.  Liver function is normal.  Thyroid is normal.  Vitamin D remains on lower side slightly, I would recommend increasing Vitamin D3 supplement to 4000 units daily, looks like you are currently taking 2000 units daily.  Any questions? Keep being awesome!!  Thank you for allowing me to participate in your care. Kindest regards, Louna Rothgeb

## 2020-11-06 DIAGNOSIS — E1142 Type 2 diabetes mellitus with diabetic polyneuropathy: Secondary | ICD-10-CM | POA: Diagnosis not present

## 2020-11-13 NOTE — Progress Notes (Incomplete)
Chronic Care Management Pharmacy Note  11/13/2020 Name:  Lucrecia Mcphearson MRN:  [id number] DOB:  Sep 27, 1951  Subjective: Lauri Purdum is an 70 y.o. year old female who is a primary patient of [name], [name], NP.  The CCM team was consulted for assistance with disease management and care coordination needs.    Engaged with patient by telephone for initial visit with [facility] in response to provider referral for pharmacy case management and/or care coordination services.   Consent to Services:  The patient was given the following information about Chronic Care Management services today, agreed to services, and gave verbal consent: 1. CCM service includes personalized support from designated clinical staff supervised by the primary care provider, including individualized plan of care and coordination with other care providers 2. 24/7 contact phone numbers for assistance for urgent and routine care needs. 3. Service will only be billed when office clinical staff spend 20 minutes or more in a month to coordinate care. 4. Only one practitioner may furnish and bill the service in a calendar month. 5.The patient may stop CCM services at any time (effective at the end of the month) by phone call to the office staff. 6. The patient will be responsible for cost sharing (co-pay) of up to 20% of the service fee (after annual deductible is met). Patient agreed to services and consent obtained.  Patient Care Team: [name], NP as PCP - General (Nurse Practitioner) Wellington Hampshire, MD as PCP - Cardiology (Cardiology) Vladimir Crofts, MD (Neurology) Wellington Hampshire, MD as Consulting Physician (Cardiology) Judi Cong, MD as Physician Assistant (Endocrinology) Vanita Ingles, RN as Case Manager (General Practice) Vladimir Faster, [facility] (Pharmacist)  Recent office visits: 11/04/20- [name], DNP-  Blood work  Recent consult visits: 09/05/20- Dr. [name] endocrinology-  freestyle Elenor [place] ordered,  Repeat DEXA for T scores prior to medical txy  Hospital visits: None in previous 6 months  Objective:  Lab Results  Component Value Date   CREATININE 1.15 (H) 11/04/2020   BUN 13 11/04/2020   GFRNONAA 48 (L) 11/04/2020   GFRAA 56 (L) 11/04/2020   NA 144 11/04/2020   K 4.3 11/04/2020   CALCIUM 9.6 11/04/2020   CO2 27 11/04/2020    Lab Results  Component Value Date/Time   HGBA1C 5.8 08/26/2020 12:00 AM   HGBA1C 6.1 03/10/2020 12:00 AM   MICROALBUR 30 (H) 12/10/2019 03:09 PM   MICROALBUR 30 (H) 04/17/2019 01:37 PM    Last diabetic Eye exam:  Lab Results  Component Value Date/Time   HMDIABEYEEXA Retinopathy (A) 02/25/2020 12:00 AM    Last diabetic Foot exam: No results found for: HMDIABFOOTEX   Lab Results  Component Value Date   CHOL 128 11/04/2020   HDL 66 11/04/2020   LDLCALC 42 11/04/2020   TRIG 114 11/04/2020    Hepatic Function Latest Ref Rng & Units 11/04/2020 12/10/2019 07/30/2019  Total Protein 6.0 - 8.5 g/dL 6.7 7.1 6.6  Albumin 3.8 - 4.8 g/dL 4.2 4.5 4.2  AST 0 - 40 IU/L 20 20 18   ALT 0 - 32 IU/L 17 36(H) 19  Alk Phosphatase 44 - 121 IU/L 74 71 68  Total Bilirubin 0.0 - 1.2 mg/dL 0.6 0.8 0.5    Lab Results  Component Value Date/Time   TSH 1.080 11/04/2020 02:06 PM   TSH 1.413 12/12/2019 04:19 AM   TSH 1.800 12/10/2019 04:52 PM    CBC Latest Ref Rng & Units 02/20/2020 12/12/2019  12/11/2019  WBC 3.4 - 10.8 x10E3/uL 6.9 4.9 11.3(H)  Hemoglobin 11.1 - 15.9 g/dL 12.3 11.2(L) 13.2  Hematocrit 34.0 - 46.6 % 37.0 32.3(L) 39.5  Platelets 150 - 450 x10E3/uL 212 134(L) 209    Lab Results  Component Value Date/Time   VD25OH 27.8 (L) 11/04/2020 02:06 PM   VD25OH 32.2 12/10/2019 04:52 PM    Clinical ASCVD: No  The ASCVD Risk score Mikey Bussing DC Jr., et al., 2013) failed to calculate for the following reasons:   The valid total cholesterol range is 130 to 320 mg/dL    Depression screen [facility] 2/9 11/04/2020 01/28/2020 01/16/2020  Decreased  Interest 0 1 0  Down, Depressed, Hopeless 0 1 0  PHQ - 2 Score 0 2 0  Altered sleeping 0 2 -  Tired, decreased energy 0 3 -  Change in appetite 0 1 -  Feeling bad or failure about yourself  0 0 -  Trouble concentrating 0 0 -  Moving slowly or fidgety/restless 0 0 -  Suicidal thoughts 0 0 -  PHQ-9 Score 0 8 -  Difficult doing work/chores - Not difficult at all -  Some recent data might be hidden      Social History   Tobacco Use  Smoking Status Never Smoker  Smokeless Tobacco Never Used   BP Readings from Last 3 Encounters:  11/04/20 110/69  04/23/20 104/65  04/03/20 108/60   Pulse Readings from Last 3 Encounters:  11/04/20 76  04/23/20 88  04/03/20 87   Wt Readings from Last 3 Encounters:  11/04/20 152 lb 3.2 oz (69 kg)  04/03/20 158 lb (71.7 kg)  03/12/20 160 lb 12.8 oz (72.9 kg)    Assessment/Interventions: Review of patient past medical history, allergies, medications, health status, including review of consultants reports, laboratory and other test data, was performed as part of comprehensive evaluation and provision of chronic care management services.   SDOH:  (Social Determinants of Health) assessments and interventions performed: Yes   CCM Care Plan  Allergies  Allergen Reactions  . Compazine [Prochlorperazine Edisylate] Rash  . Exenatide Nausea Only  . Insulin Aspart Nausea Only    Medications Reviewed Today    Reviewed by [name], NP (Nurse Practitioner) on 11/04/20 at 1425  Med List Status: <None>  Medication Order Taking? Sig Documenting Provider Last Dose Status Informant  aspirin 81 MG tablet [id number] Yes Take 81 mg by mouth daily. [provider] Taking Active Spouse/Significant Other  Cholecalciferol (VITAMIN D PO) [id number] Yes Take 2,000 Units by mouth daily.  [provider] Taking Active Spouse/Significant Other  Dulaglutide (TRULICITY [place]) [id number] Yes Inject 1.5 mg into the skin once a week. [provider] Taking Active   ferrous fumarate (HEMOCYTE - 106 MG FE) 325 (106 FE) MG TABS tablet [id number] Yes Take 1 tablet by mouth daily. [provider] Taking Active Spouse/Significant Other  FLUoxetine (PROZAC) 40 MG capsule [id number] Yes Take 1 capsule (40 mg total) by mouth daily. [name] T, NP Taking Active Spouse/Significant Other  hydrOXYzine (VISTARIL) 25 MG capsule [id number] No TAKE 1 CAPSULE (25 MG TOTAL) BY MOUTH 2 (TWO) TIMES DAILY AS NEEDED (DIZZINESS).  Patient not taking: Reported on 11/04/2020   [name] T, NP Not Taking Active   insulin regular human CONCENTRATED (HUMULIN R) 500 UNIT/ML Claiborne Rigg [id number] Yes Inject 40 units at 8 AM. Inject 40 units at 2 PM. Inject 20 units at 8 PM. [provider] Taking Active   Multiple Vitamin (MULTIVITAMIN)  tablet [id number] Yes Take 1 tablet by mouth daily. [provider] Taking Active Spouse/Significant Other  omeprazole (PRILOSEC) 20 MG capsule [id number] Yes TAKE 1 CAPSULE BY MOUTH EVERY DAY [name], Jolene T, NP Taking Active   ramipril (ALTACE) 2.5 MG capsule [id number] Yes TAKE 1 CAPSULE BY MOUTH EVERY DAY [name], Jolene T, NP Taking Active   simvastatin (ZOCOR) 40 MG tablet [id number] Yes TAKE 1 TABLET BY MOUTH EVERY DAY [name], Jolene T, NP Taking Active   traZODone (DESYREL) 50 MG tablet [id number] Yes Take 1 tablet (50 mg total) by mouth at bedtime. [name] T, NP Taking Active Spouse/Significant Other  vitamin B-12 (CYANOCOBALAMIN) 1000 MCG tablet [id number] Yes Take 2,000 mcg by mouth daily. [provider] Taking Active Spouse/Significant Other          Patient Active Problem List   Diagnosis Date Noted  . Vitamin D deficiency 12/10/2019  . Vitamin B12 deficiency 12/10/2019  . History of colon cancer 12/10/2019  . History of COVID-19 10/19/2019  . Chronic bilateral low back pain with bilateral sciatica 04/17/2019  . Background retinopathy 12/04/2018  . Osteopenia  11/09/2017  . Chronic kidney disease, stage 3 ([place]) 05/02/2017  . Advanced care planning/counseling discussion 11/24/2016  . Insomnia 07/02/2015  . Depression 05/21/2015  . Hypertension associated with diabetes ([place]) 05/21/2015  . Diabetic neuropathy ([place]) 05/21/2015  . Hyperlipidemia associated with type 2 diabetes mellitus ([place]) 05/21/2015  . Chronic pain syndrome 05/21/2015  . GERD (gastroesophageal reflux disease) 05/21/2015  . Uncontrolled type 2 diabetes mellitus with chronic kidney disease ([place]) 05/21/2015  . Long-term insulin use ([place]) 08/16/2014  . Type 2 diabetes mellitus with microalbuminuria ([place]) 08/16/2014    Immunization History  Administered Date(s) Administered  . Pneumococcal Conjugate-13 08/06/2015  . Pneumococcal Polysaccharide-23 03/06/2005, 11/24/2016  . Td 10/04/2005  . Zoster 01/14/2014    Conditions to be addressed/monitored:  Hypertension, Hyperlipidemia, Diabetes, GERD, Chronic Kidney Disease, Osteoporosis, Osteopenia and diabetic neuropathy, Vit B12 and D deficiencies  There are no care plans that you recently modified to display for this patient.    Medication Assistance: Application for [employer] - 749  medication assistance program. in process.  Anticipated assistance start date tbd.  This is handled by endocrinology  Patient's preferred pharmacy is:  [id number] [place], [address]Phone: [phone]: [phone] [place] [address] [address] [place]Phone: [phone]: [phone] Uses pill box? {Yes or If no, why not?:20788} Pt endorses ***% compliance  We discussed: {Pharmacy options:24294} [facility]: [facility] and Follow Up Patient Decision:  Patient agrees to Care Plan and Follow-up.  Plan: Telephone follow up appointment with care  management team member scheduled for:  3 months  ***  Current Barriers:  . {pharmacybarriers:24917} . ***  Pharmacist Clinical Goal(s):  .[name] KitchenOver the next *** days, patient will {PHARMACYGOALCHOICES:24921} through collaboration with PharmD and provider.  . ***  Interventions: . 1:1 collaboration with CVenita Lick NP regarding development and update of comprehensive plan of care as evidenced by provider attestation and co-signature . Inter-disciplinary care team collaboration (see longitudinal plan of care) . Comprehensive medication review performed; medication list updated in electronic medical record BP Readings from Last 3 Encounters:  11/04/20 110/69  04/23/20 104/65  04/03/20 108/60   Hypertension (BP goal <130/80) -  controlled -Current treatment: . ramilpril 2.5 mg qd -Medications previously tried: NA -Current home readings: *** -Current dietary habits: *** -Current exercise habits: *** -{ACTIONS;DENIES/REPORTS:21021675::"Denies"} hypotensive/hypertensive symptoms -Educated on BP goals and benefits of medications for prevention of heart attack, stroke and kidney damage; Daily salt intake goal < 2300 mg; Exercise goal of 150 minutes per week; Importance of home blood pressure monitoring; Proper BP monitoring technique; -Counseled to monitor BP at home ***, document, and provide log at future appointments -Counseled on diet and exercise extensively Recommended to continue current medication  Hyperlipidemia: (LDL goal < 70) -controlled -Current treatment: . Simvastatin 40 mg qd . Aspirin 81 mg qd -Medications previously tried: NA   -Educated on Importance of limiting foods high in cholesterol; Exercise goal of 150 minutes per week; -{CCMPHARMDINTERVENTION:25122}   Lab Results  Component Value Date/Time   HGBA1C 5.8 08/26/2020 12:00 AM   HGBA1C 6.1 03/10/2020 12:00 AM    Diabetes (A1c goal <7%) -controlled -Current medications: . Dulaglutide 1.5 mg q  week . Humulin R U-500 25-35 units tid ac meals -Medications previously tried: NA -Current home glucose readings patient uses [employer] . fasting glucose: *** . post prandial glucose: *** -{ACTIONS;DENIES/REPORTS:21021675::"Denies"} hypoglycemic/hyperglycemic symptoms -Current meal patterns:  . breakfast: ***  . lunch: ***  . dinner: *** . snacks: *** . drinks: *** --Educated onComplications of diabetes including kidney damage, retinal damage, and cardiovascular disease; Prevention and management of hypoglycemic episodes; -Counseled to check feet daily and get yearly eye exams -{CCMPHARMDINTERVENTION:25122}  Osteoporosis / Osteopenia (Goal ;prevent future fracture) -uncontrolled   Vitamin D level 11/04/20- 27.8   -Last DEXA Scan: 10/12/2017  T-Score femoral neck: -0.9  T-Score total hip: -0.3  T-Score lumbar spine: -1.4   10-year probability of major osteoporotic fracture: 13  10-year probability of hip fracture: 1.0 -Patient is not a candidate for pharmacologic treatment  RECENT WRIT  Fracture  -Current treatment  . Vitamin d 4000 u -Medications previously tried: Vitamin D ?  -Recommend 1200 mg of calcium daily from dietary and supplemental sources. Recommend weight-bearing and muscle strengthening exercises for building and maintaining bone density. -Recommended addition of calcium supplementation Upcoming DEXA scan   Depression (Goal:Minimze symptoms and improve QOL) Depression screen [facility] 2/9 11/04/2020  Decreased Interest 0  Down, Depressed, Hopeless 0  PHQ - 2 Score 0  Altered sleeping 0  Tired, decreased energy 0  Change in appetite 0  Feeling bad or failure about yourself  0  Trouble concentrating 0  Moving slowly or fidgety/restless 0  Suicidal thoughts 0  PHQ-9 Score 0  Difficult doing work/chores -  Some recent data might be hidden   Personnel officer Visit from 11/04/2020 in [place]  PHQ-2 Total Score 0     -controlled -Current  treatment  . Fluoxetine 40 mg qd -Medications previously tried: NA  -Recommended to continue current medication    Health Maintenance Vaccines   Reviewed and discussed patient's vaccination history.    Immunization History  Administered Date(s) Administered  . Pneumococcal Conjugate-13 08/06/2015  . Pneumococcal Polysaccharide-23 03/06/2005, 11/24/2016  . Td 10/04/2005  . Zoster 01/14/2014   -Vaccine gaps: COVID, Flu, Shingrix -Current therapy:   Vitamin D 4000 u daily  MVI daily  Vitamin B12 1000 mcg qd last level 1532 12/11/19 -Educated on {ccm supplement counseling:25128} -Patient is satisfied with current therapy and denies issues -Counseled on diet and exercise extensively Recommended to continue current medication   Patient Goals/Self-Care Activities . Over the next *** days, patient  will:  - {pharmacypatientgoals:24919}  Follow Up Plan: {CM FOLLOW UP ZZCK:22179}

## 2020-11-14 ENCOUNTER — Telehealth: Payer: Self-pay

## 2020-11-22 ENCOUNTER — Other Ambulatory Visit: Payer: Self-pay | Admitting: Nurse Practitioner

## 2020-11-22 ENCOUNTER — Other Ambulatory Visit: Payer: Self-pay | Admitting: Gastroenterology

## 2020-11-22 NOTE — Telephone Encounter (Signed)
Requested Prescriptions  Pending Prescriptions Disp Refills  . omeprazole (PRILOSEC) 20 MG capsule [Pharmacy Med Name: OMEPRAZOLE DR 20 MG CAPSULE] 90 capsule 0    Sig: TAKE 1 CAPSULE BY MOUTH EVERY DAY     Gastroenterology: Proton Pump Inhibitors Passed - 11/22/2020  3:56 PM      Passed - Valid encounter within last 12 months    Recent Outpatient Visits          2 weeks ago Uncontrolled type 2 diabetes mellitus with chronic kidney disease ([place])   [place] [name], Jolene T, NP   7 months ago Uncontrolled type 2 diabetes mellitus with chronic kidney disease ([place])   [place], [place] T, NP   8 months ago Dizziness   [place], [place] T, NP   9 months ago Uncontrolled type 2 diabetes mellitus with chronic kidney disease ([place])   [place], Jolene T, NP   11 months ago Type 2 diabetes mellitus with microalbuminuria, with long-term current use of insulin ([place])   [place], [name], NP      Future Appointments            In 1 week  [place], [place]   In 5 months [name], [name], NP [employer]           . FLUoxetine (PROZAC) 40 MG capsule [Pharmacy Med Name: FLUOXETINE HCL 40 MG CAPSULE] 90 capsule 0    Sig: TAKE 1 CAPSULE BY MOUTH EVERY DAY     Psychiatry:  Antidepressants - SSRI Passed - 11/22/2020  3:56 PM      Passed - Completed PHQ-2 or PHQ-9 in the last 360 days      Passed - Valid encounter within last 6 months    Recent Outpatient Visits          2 weeks ago Uncontrolled type 2 diabetes mellitus with chronic kidney disease ([place])   [place] [name], Jolene T, NP   7 months ago Uncontrolled type 2 diabetes mellitus with chronic kidney disease ([place])   [place], [place] T, NP   8 months ago Dizziness   [place], [place] T, NP   9 months ago Uncontrolled type 2 diabetes  mellitus with chronic kidney disease ([place])   [place], Jolene T, NP   11 months ago Type 2 diabetes mellitus with microalbuminuria, with long-term current use of insulin ([place])   [place], [name], NP      Future Appointments            In 1 week  [place], [place]   In 5 months [name], [name], NP [employer], [facility]

## 2020-12-01 ENCOUNTER — Ambulatory Visit (INDEPENDENT_AMBULATORY_CARE_PROVIDER_SITE_OTHER): Payer: Medicare Other

## 2020-12-01 ENCOUNTER — Other Ambulatory Visit: Payer: Self-pay | Admitting: Nurse Practitioner

## 2020-12-01 VITALS — Ht 66.5 in | Wt 148.0 lb

## 2020-12-01 DIAGNOSIS — Z0000 Encounter for general adult medical examination without abnormal findings: Secondary | ICD-10-CM

## 2020-12-01 MED ORDER — FLUOXETINE HCL 40 MG PO CAPS: ORAL_CAPSULE | ORAL | 4 refills | Status: DC

## 2020-12-01 MED ORDER — OMEPRAZOLE 20 MG PO CPDR: DELAYED_RELEASE_CAPSULE | ORAL | 4 refills | Status: DC

## 2020-12-01 NOTE — Progress Notes (Signed)
I connected with Jessica Montoya  today by telephone and verified that I am speaking with the correct person using two identifiers. Location patient: home Location provider: work Persons participating in the virtual visit: Searra Carnathan, Glenna Durand LPN.   I discussed the limitations, risks, security and privacy concerns of performing an evaluation and management service by telephone and the availability of in person appointments. I also discussed with the patient that there may be a patient responsible charge related to this service. The patient expressed understanding and verbally consented to this telephonic visit.    Interactive audio and video telecommunications were attempted between this provider and patient, however failed, due to patient having technical difficulties OR patient did not have access to video capability.  We continued and completed visit with audio only.     Vital signs may be patient reported or missing.  Subjective:   Jessica Montoya is a 70 y.o. female who presents for Medicare Annual (Subsequent) preventive examination.  Review of Systems     Cardiac Risk Factors include: advanced age (>38men, >8 women);diabetes mellitus;dyslipidemia;hypertension     Objective:    Today's Vitals   12/01/20 1256  Weight: 148 lb (67.1 kg)  Height: 5' 6.5" (1.689 m)   Body mass index is 23.53 kg/m.  Advanced Directives 12/01/2020 02/05/2020 12/11/2019 11/28/2019 03/27/2019 02/20/2019 06/01/2018  Does Patient Have a Medical Advance Directive? No No No No No No No  Would patient like information on creating a medical advance directive? - No - Patient declined No - Patient declined - No - Patient declined No - Patient declined Yes (MAU/Ambulatory/Procedural Areas - Information given)    Current Medications (verified) Outpatient Encounter Medications as of 12/01/2020  Medication Sig  . aspirin 81 MG tablet Take 81 mg by mouth daily.  . Cholecalciferol (VITAMIN D PO) Take 2,000 Units  by mouth daily.   . Dulaglutide (TRULICITY [place]) Inject 1.5 mg into the skin once a week.  . ferrous fumarate (HEMOCYTE - 106 MG FE) 325 (106 FE) MG TABS tablet Take 1 tablet by mouth daily.  [name] FLUoxetine (PROZAC) 40 MG capsule TAKE 1 CAPSULE BY MOUTH EVERY DAY  . insulin regular human CONCENTRATED (HUMULIN R) 500 UNIT/ML kwikpen Inject 40 units at 8 AM. Inject 40 units at 2 PM. Inject 20 units at 8 PM.  . Multiple Vitamin (MULTIVITAMIN) tablet Take 1 tablet by mouth daily.  [name] omeprazole (PRILOSEC) 20 MG capsule TAKE 1 CAPSULE BY MOUTH EVERY DAY  . ramipril (ALTACE) 2.5 MG capsule TAKE 1 CAPSULE BY MOUTH EVERY DAY  . simvastatin (ZOCOR) 40 MG tablet TAKE 1 TABLET BY MOUTH EVERY DAY  . traZODone (DESYREL) 50 MG tablet Take 1 tablet (50 mg total) by mouth at bedtime.  . vitamin B-12 (CYANOCOBALAMIN) 1000 MCG tablet Take 2,000 mcg by mouth daily.  . hydrOXYzine (VISTARIL) 25 MG capsule TAKE 1 CAPSULE (25 MG TOTAL) BY MOUTH 2 (TWO) TIMES DAILY AS NEEDED (DIZZINESS). (Patient not taking: No sig reported)   No facility-administered encounter medications on file as of 12/01/2020.    Allergies (verified) Compazine [prochlorperazine edisylate], Exenatide, and Insulin aspart   History: Past Medical History:  Diagnosis Date  . Anxiety   . Arthritis    knees  . Chronic pain   . CKD (chronic kidney disease), stage II   . Depression   . Diabetes mellitus without complication ([place])   . Diabetic neuropathy ([place])   . Fatigue    a. 05/2018 Echo: EF 60-65%, no rwma,  nl RV fxn; b. 05/2018 Lexiscan MV: EF>65%. No ischemia/infarct.  . Hyperlipidemia   . Hypertension   . Insomnia   . Long term current use of insulin ([place])   . Peripheral neuropathy   . Vertigo    2x/month  . Wears dentures    full upper and lower   Past Surgical History:  Procedure Laterality Date  . ABDOMINAL HYSTERECTOMY    . CATARACT EXTRACTION W/PHACO Left 02/20/2019   Procedure: CATARACT EXTRACTION PHACO AND INTRAOCULAR LENS  PLACEMENT ([place]) LEFT DIABETES;  Surgeon: Birder Robson, MD;  Location: [facility];  Service: Ophthalmology;  Laterality: Left;  . CATARACT EXTRACTION W/PHACO Right 03/27/2019   Procedure: CATARACT EXTRACTION PHACO AND INTRAOCULAR LENS PLACEMENT ([place])  RIGHT DIABETIC;  Surgeon: Birder Robson, MD;  Location: [facility];  Service: Ophthalmology;  Laterality: Right;  Diabetic - insulin  . COLON SURGERY    . COLONOSCOPY WITH PROPOFOL N/A 02/05/2020   Procedure: COLONOSCOPY WITH PROPOFOL;  Surgeon: Virgel Manifold, MD;  Location: [facility];  Service: Endoscopy;  Laterality: N/A;  . ESOPHAGOGASTRODUODENOSCOPY (EGD) WITH PROPOFOL N/A 02/05/2020   Procedure: ESOPHAGOGASTRODUODENOSCOPY (EGD) WITH PROPOFOL;  Surgeon: Virgel Manifold, MD;  Location: [facility];  Service: Endoscopy;  Laterality: N/A;   Family History  Problem Relation Age of Onset  . Cancer Mother        melanoma  . Breast cancer Mother 47  . Cancer Father        pancreatic  . Hypertension Maternal Grandmother   . Cancer Maternal Grandmother        colon  . Cancer Paternal Grandmother        stomach  . Stroke Paternal Grandfather   . Cancer Maternal Grandfather        kidney   Social History   Socioeconomic History  . Marital status: Married    Spouse name: Not on file  . Number of children: Not on file  . Years of education: Not on file  . Highest education level: High school graduate  Occupational History  . Occupation: retired  Tobacco Use  . Smoking status: Never Smoker  . Smokeless tobacco: Never Used  Vaping Use  . Vaping Use: Never used  Substance and Sexual Activity  . Alcohol use: No    Alcohol/week: 0.0 standard drinks  . Drug use: No  . Sexual activity: Yes  Other Topics Concern  . Not on file  Social History Narrative  . Not on file   Social Determinants of Health   Financial Resource Strain: Low Risk   . Difficulty of Paying Living Expenses: Not hard at all   Food Insecurity: No Food Insecurity  . Worried About Charity fundraiser in the Last Year: Never true  . Ran Out of Food in the Last Year: Never true  Transportation Needs: No Transportation Needs  . Lack of Transportation (Medical): No  . Lack of Transportation (Non-Medical): No  Physical Activity: Insufficiently Active  . Days of Exercise per Week: 3 days  . Minutes of Exercise per Session: 30 min  Stress: No Stress Concern Present  . Feeling of Stress : Not at all  Social Connections: Moderately Isolated  . Frequency of Communication with Friends and Family: More than three times a week  . Frequency of Social Gatherings with Friends and Family: More than three times a week  . Attends Religious Services: Never  . Active Member of Clubs or Organizations: No  . Attends Archivist Meetings:  Never  . Marital Status: Married    Tobacco Counseling Counseling given: Not Answered   Clinical Intake:  Pre-visit preparation completed: Yes  Pain : No/denies pain     Nutritional Status: BMI of 19-24  Normal Nutritional Risks: None Diabetes: Yes  How often do you need to have someone help you when you read instructions, pamphlets, or other written materials from your doctor or pharmacy?: 1 - Never What is the last grade level you completed in school?: 12th grade  Diabetic? Yes Nutrition Risk Assessment:  Has the patient had any N/V/D within the last 2 months?  No  Does the patient have any non-healing wounds?  No  Has the patient had any unintentional weight loss or weight gain?  No   Diabetes:  Is the patient diabetic?  Yes  If diabetic, was a CBG obtained today?  No  Did the patient bring in their glucometer from home?  No  How often do you monitor your CBG's? 3 times daily.   Financial Strains and Diabetes Management:  Are you having any financial strains with the device, your supplies or your medication? No .  Does the patient want to be seen by Chronic  Care Management for management of their diabetes?  No  Would the patient like to be referred to a Nutritionist or for Diabetic Management?  No   Diabetic Exams:  Diabetic Eye Exam: Completed 02/25/2020 Diabetic Foot Exam: Completed 09/05/2020   Interpreter Needed?: No  Information entered by :: NAllen LPN   Activities of Daily Living In your present state of health, do you have any difficulty performing the following activities: 12/01/2020 12/11/2019  Hearing? N N  Vision? N N  Difficulty concentrating or making decisions? N N  Walking or climbing stairs? N Y  Dressing or bathing? N N  Doing errands, shopping? N N  Preparing Food and eating ? N -  Using the Toilet? N -  In the past six months, have you accidently leaked urine? N -  Do you have problems with loss of bowel control? N -  Managing your Medications? N -  Managing your Finances? N -  Housekeeping or managing your Housekeeping? N -  Some recent data might be hidden    Patient Care Team: [name], NP as PCP - General (Nurse Practitioner) Wellington Hampshire, MD as PCP - Cardiology (Cardiology) Vladimir Crofts, MD (Neurology) Wellington Hampshire, MD as Consulting Physician (Cardiology) [name] Betsey Holiday, MD as Physician Assistant (Endocrinology) Vanita Ingles, RN as Case Manager ([place]) Vladimir Faster, [facility] (Pharmacist)  Indicate any recent Medical Services you may have received from other than [facility] providers in the past year (date may be approximate).     Assessment:   This is a routine wellness examination for Zanna.  Hearing/Vision screen No exam data present  Dietary issues and exercise activities discussed: Current Exercise Habits: Home exercise routine, Type of exercise: walking, Time (Minutes): 30, Frequency (Times/Week): 3, Weekly Exercise (Minutes/Week): 90  Goals    .  DIET - INCREASE WATER INTAKE      Recommend drinking at least 6-8 glasses of water a day     .  Patient Stated       12/01/2020, no goals    .  PharmD "I can't afford my insulin" (pt-stated)      CARE PLAN ENTRY (see longtitudinal plan of care for additional care plan information)  Current Barriers:  . Diabetes: uncontrolled but improved; most  recent A1c 6.7%; follows w/ Dr. [name];  . Current antihyperglycemic regimen: Humulin R U500 40 units @ 8 am, 40 units @ noon, 161 pm; Trulicity 1.5 mg once weekly  o Previously approved for Humulin W960 and Trulicity assistance through 08/20/2020 . Current glucose readings:  o Reports issues w/ hypoglycemia, particularly overnight, since initiation and escalation of Trulicity. Continues to reduce Humulin to reduce hypoglycemia o Reports readings 40-50 in the evenings, even with 35 units QPM . Cardiovascular risk reduction: o Current hypertensive regimen: ramipril 2.5 mg daily o Current hyperlipidemia regimen: simvastatin 40 mg daily, holding while on concurrent fluconazole therapy   Pharmacist Clinical Goal(s):  [name] Over the next 90 days, patient with work with PharmD and primary care provider to address optimized glycemic regimen  Interventions: . Comprehensive medication review performed, medication list updated in electronic medical record . Inter-disciplinary care team collaboration (see longitudinal plan of care) . Reviewed PK of Humulin U500 - onset ~15 minutes, peak 3-4 hours, duration 13-24 hours, half life elimination 4.5 hours. Recommended that patient try to adjust insulin doses to be with a meal, as opposed to at specific times of the day. This likely won't completely reduce episodes of hypoglycemia. Encouraged to reduce evening Humulin dose to 25 units, and continue to reduce doses if she develops hypoglycemia. Has f/u with Dr. [name] in ~3 weeks . Encouraged to discuss increasing Trulicity to 3 mg weekly, as patient is tolerating 1.5 mg. Continued reduction of insulin dosing will be beneficial in reducing risk of hypoglycemia and promoting weight loss    Patient Self Care Activities:  . Patient will check blood glucose BID , document, and provide at future appointments . Patient will take medications as prescribed . Patient will report any questions or concerns to provider   Please see past updates related to this goal by clicking on the "Past Updates" button in the selected goal      .  RNCM: "I am trying to get my sugar under control" (pt-stated)      [place] (see longtitudinal plan of care for additional care plan information)  Current Barriers:  . Chronic Disease Management support, education, and care coordination needs related to HTN, HLD, DMII, and Depression  Clinical Goal(s) related to HTN, HLD, DMII, and Depression:  Over the next 120 days, patient will:  . Work with the care management team to address educational, disease management, and care coordination needs  . Begin or continue self health monitoring activities as directed today Measure and record cbg (blood glucose) 3 times daily, Measure and record blood pressure 2 times per week, and adhere to a heart healthy/ADA diet  . Call provider office for new or worsened signs and symptoms Blood glucose findings outside established parameters, Blood pressure findings outside established parameters, Shortness of breath, and New or worsened symptom related to depression or other chronic conditions  . Call care management team with questions or concerns . Verbalize basic understanding of patient centered plan of care established today  Interventions related to HTN, HLD, DMII, and Depression:  . Evaluation of current treatment plans and patient's adherence to plan as established by provider.  The patient sees an Endocrinologist and pcp.  She says they are working with her to get her blood sugars regulated. She is on Trulicity now and sees a big improvement. Has experienced some lows and has reported these to the provider.  Evaluation of eating a bedtime snack and monitoring  for low blood sugars. The patient  confirmed she is eating a bedtime snack. Fast blood sugars are 186 to 200 but sometimes she drops during the day and night to 60's.   . Assessed patient understanding of disease states.  The patient verbalized understanding of chronic conditions. Takes her blood pressures sometimes and it is usually good. Does experience some light headedness at times.  Discussed with patient the possibility of having her blood pressure drop when changing position. The patient stated they checked orthostatic blood pressures when she was in the hospital. She says sometimes when she checks it it is a little low. Education and support given.  . Assessed patient's education and care coordination needs.  The patient verbalized she needs a shower chair for safety when taking a shower. Ask the patient about the OTC benefit with [facility]. Instructed the patient to call customer service to see if she has this benefit. She will do this. Instructed the patient if she did not have the OTC benefit to ask the insurance company what they would pay toward a shower chair. The patient will let the CM know what her findings are.  . Provided disease specific education to patient.  Education on following a low sodium/ADA diet  . Collaborated with appropriate clinical care team members regarding patient needs.  The patient is currently working with pharmacist and knows of LCSW availability. She feels her depression is well controlled at this time due to the changes in medication.  The patient denies any immediate needs.  . Assessed fall risk. The patient walks with a cane.  Reviewed using good fitting shoes, slipper socks, and avoiding throw rugs and cords. The patient verbalized understanding.  The patient states she has not had a fall x 2 months.   Patient Self Care Activities related to HTN, HLD, DMII, and Depression:  . Patient is unable to independently self-manage chronic health conditions  Initial goal  documentation       Depression Screen PHQ 2/9 Scores 12/01/2020 11/04/2020 01/28/2020 01/16/2020 12/10/2019 11/28/2019 10/30/2019  PHQ - 2 Score 0 0 2 0 2 1 1   PHQ- 9 Score 6 0 8 - 8 - 8    Fall Risk Fall Risk  12/01/2020 11/04/2020 11/28/2019 04/17/2019 06/01/2018  Falls in the past year? 1 0 1 1 Yes  Comment lost balance - - - -  Number falls in past yr: 1 0 0 0 1  Injury with Fall? 1 - 1 0 No  Comment broke arm - - - -  Risk for fall due to : Medication side effect;Impaired balance/gait - Impaired balance/gait - -  Follow up Falls evaluation completed;Education provided;Falls prevention discussed - Falls prevention discussed Falls evaluation completed -    FALL RISK PREVENTION PERTAINING TO THE HOME:  Any stairs in or around the home? Yes  If so, are there any without handrails? Yes  Home free of loose throw rugs in walkways, pet beds, electrical cords, etc? Yes  Adequate lighting in your home to reduce risk of falls? Yes   ASSISTIVE DEVICES UTILIZED TO PREVENT FALLS:  Life alert? No  Use of a cane, walker or w/c? Yes  Grab bars in the bathroom? Yes  Shower chair or bench in shower? No  Elevated toilet seat or a handicapped toilet? No   TIMED UP AND GO:  Was the test performed? No .    Cognitive Function:     6CIT Screen 12/01/2020 06/01/2018  What Year? 0 points 0 points  What month? 0 points 0 points  What time? 0 points 0 points  Count back from 20 0 points 0 points  Months in reverse 0 points 0 points  Repeat phrase 0 points 0 points  Total Score 0 0    Immunizations Immunization History  Administered Date(s) Administered  . Pneumococcal Conjugate-13 08/06/2015  . Pneumococcal Polysaccharide-23 03/06/2005, 11/24/2016  . Td 10/04/2005  . Zoster 01/14/2014    TDAP status: Due, Education has been provided regarding the importance of this vaccine. Advised may receive this vaccine at local pharmacy or Health Dept. Aware to provide a copy of the vaccination record if  obtained from local pharmacy or Health Dept. Verbalized acceptance and understanding.  Flu Vaccine status: Declined, Education has been provided regarding the importance of this vaccine but patient still declined. Advised may receive this vaccine at local pharmacy or Health Dept. Aware to provide a copy of the vaccination record if obtained from local pharmacy or Health Dept. Verbalized acceptance and understanding.  Pneumococcal vaccine status: Up to date  Covid-19 vaccine status: Declined, Education has been provided regarding the importance of this vaccine but patient still declined. Advised may receive this vaccine at local pharmacy or Health Dept.or vaccine clinic. Aware to provide a copy of the vaccination record if obtained from local pharmacy or Health Dept. Verbalized acceptance and understanding.  Qualifies for Shingles Vaccine? Yes   Zostavax completed Yes   Shingrix Completed?: No.    Education has been provided regarding the importance of this vaccine. Patient has been advised to call insurance company to determine out of pocket expense if they have not yet received this vaccine. Advised may also receive vaccine at local pharmacy or Health Dept. Verbalized acceptance and understanding.  Screening Tests Health Maintenance  Topic Date Due  . COVID-19 Vaccine (1) Never done  . MAMMOGRAM  10/16/2020  . INFLUENZA VACCINE  01/01/2021 (Originally 05/04/2020)  . TETANUS/TDAP  11/04/2021 (Originally 10/05/2015)  . OPHTHALMOLOGY EXAM  02/24/2021  . HEMOGLOBIN A1C  03/06/2021  . FOOT EXAM  09/05/2021  . COLONOSCOPY (Pts 45-58yrs Insurance coverage will need to be confirmed)  02/04/2025  . DEXA SCAN  Completed  . Hepatitis C Screening  Completed  . PNA vac Low Risk Adult  Completed    Health Maintenance  Health Maintenance Due  Topic Date Due  . COVID-19 Vaccine (1) Never done  . MAMMOGRAM  10/16/2020    Colorectal cancer screening: Type of screening: Colonoscopy. Completed 02/05/2020.  Repeat every 2 years  Mammogram status: patient to reschedule  Bone Density status: Completed 10/21/2017. Scheduled 12/31/2020  Lung Cancer Screening: (Low Dose CT Chest recommended if Age 68-80 years, 30 pack-year currently smoking OR have quit w/in 15years.) does not qualify.   Lung Cancer Screening Referral: no  Additional Screening:  Hepatitis C Screening: does qualify; Completed 08/06/2015   Vision Screening: Recommended annual ophthalmology exams for early detection of glaucoma and other disorders of the eye. Is the patient up to date with their annual eye exam?  Yes  Who is the provider or what is the name of the office in which the patient attends annual eye exams? [facility] If pt is not established with a provider, would they like to be referred to a provider to establish care? No .   Dental Screening: Recommended annual dental exams for proper oral hygiene  Community Resource Referral / Chronic Care Management: CRR required this visit?  No   CCM required this visit?  No      Plan:  I have personally reviewed and noted the following in the patient's chart:   . Medical and social history . Use of alcohol, tobacco or illicit drugs  . Current medications and supplements . Functional ability and status . Nutritional status . Physical activity . Advanced directives . List of other physicians . Hospitalizations, surgeries, and ER visits in previous 12 months . Vitals . Screenings to include cognitive, depression, and falls . Referrals and appointments  In addition, I have reviewed and discussed with patient certain preventive protocols, quality metrics, and best practice recommendations. A written personalized care plan for preventive services as well as general preventive health recommendations were provided to patient.     Kellie Simmering, LPN   [id number]   Nurse Notes:

## 2020-12-01 NOTE — Patient Instructions (Signed)
Jessica Montoya , Thank you for taking time to come for your Medicare Wellness Visit. I appreciate your ongoing commitment to your health goals. Please review the following plan we discussed and let me know if I can assist you in the future.   Screening recommendations/referrals: Colonoscopy: completed 02/05/2020 Mammogram: patient to schedule Bone Density: scheduled for 12/31/2020 Recommended yearly ophthalmology/optometry visit for glaucoma screening and checkup Recommended yearly dental visit for hygiene and checkup  Vaccinations: Influenza vaccine: decline Pneumococcal vaccine: completed 11/24/2016 Tdap vaccine: due Shingles vaccine: discussed   Covid-19: decline  Advanced directives: Advance directive discussed with you today.   Conditions/risks identified: none  Next appointment: Follow up in one year for your annual wellness visit    Preventive Care 65 Years and Older, Female Preventive care refers to lifestyle choices and visits with your health care provider that can promote health and wellness. What does preventive care include?  A yearly physical exam. This is also called an annual well check.  Dental exams once or twice a year.  Routine eye exams. Ask your health care provider how often you should have your eyes checked.  Personal lifestyle choices, including:  Daily care of your teeth and gums.  Regular physical activity.  Eating a healthy diet.  Avoiding tobacco and drug use.  Limiting alcohol use.  Practicing safe sex.  Taking low-dose aspirin every day.  Taking vitamin and mineral supplements as recommended by your health care provider. What happens during an annual well check? The services and screenings done by your health care provider during your annual well check will depend on your age, overall health, lifestyle risk factors, and family history of disease. Counseling  Your health care provider may ask you questions about your:  Alcohol  use.  Tobacco use.  Drug use.  Emotional well-being.  Home and relationship well-being.  Sexual activity.  Eating habits.  History of falls.  Memory and ability to understand (cognition).  Work and work Statistician.  Reproductive health. Screening  You may have the following tests or measurements:  Height, weight, and BMI.  Blood pressure.  Lipid and cholesterol levels. These may be checked every 5 years, or more frequently if you are over 13 years old.  Skin check.  Lung cancer screening. You may have this screening every year starting at age 85 if you have a 30-pack-year history of smoking and currently smoke or have quit within the past 15 years.  Fecal occult blood test (FOBT) of the stool. You may have this test every year starting at age 40.  Flexible sigmoidoscopy or colonoscopy. You may have a sigmoidoscopy every 5 years or a colonoscopy every 10 years starting at age 88.  Hepatitis C blood test.  Hepatitis B blood test.  Sexually transmitted disease (STD) testing.  Diabetes screening. This is done by checking your blood sugar (glucose) after you have not eaten for a while (fasting). You may have this done every 1-3 years.  Bone density scan. This is done to screen for osteoporosis. You may have this done starting at age 49.  Mammogram. This may be done every 1-2 years. Talk to your health care provider about how often you should have regular mammograms. Talk with your health care provider about your test results, treatment options, and if necessary, the need for more tests. Vaccines  Your health care provider may recommend certain vaccines, such as:  Influenza vaccine. This is recommended every year.  Tetanus, diphtheria, and acellular pertussis (Tdap, Td) vaccine. You may  need a Td booster every 10 years.  Zoster vaccine. You may need this after age 23.  Pneumococcal 13-valent conjugate (PCV13) vaccine. One dose is recommended after age  10.  Pneumococcal polysaccharide (PPSV23) vaccine. One dose is recommended after age 44. Talk to your health care provider about which screenings and vaccines you need and how often you need them. This information is not intended to replace advice given to you by your health care provider. Make sure you discuss any questions you have with your health care provider. Document Released: 10/17/2015 Document Revised: 06/09/2016 Document Reviewed: 07/22/2015 Elsevier Interactive Patient Education  2017 [place] Prevention in the Home Falls can cause injuries. They can happen to people of all ages. There are many things you can do to make your home safe and to help prevent falls. What can I do on the outside of my home?  Regularly fix the edges of walkways and driveways and fix any cracks.  Remove anything that might make you trip as you walk through a door, such as a raised step or threshold.  Trim any bushes or trees on the path to your home.  Use bright outdoor lighting.  Clear any walking paths of anything that might make someone trip, such as rocks or tools.  Regularly check to see if handrails are loose or broken. Make sure that both sides of any steps have handrails.  Any raised decks and porches should have guardrails on the edges.  Have any leaves, snow, or ice cleared regularly.  Use sand or salt on walking paths during winter.  Clean up any spills in your garage right away. This includes oil or grease spills. What can I do in the bathroom?  Use night lights.  Install grab bars by the toilet and in the tub and shower. Do not use towel bars as grab bars.  Use non-skid mats or decals in the tub or shower.  If you need to sit down in the shower, use a plastic, non-slip stool.  Keep the floor dry. Clean up any water that spills on the floor as soon as it happens.  Remove soap buildup in the tub or shower regularly.  Attach bath mats securely with double-sided  non-slip rug tape.  Do not have throw rugs and other things on the floor that can make you trip. What can I do in the bedroom?  Use night lights.  Make sure that you have a light by your bed that is easy to reach.  Do not use any sheets or blankets that are too big for your bed. They should not hang down onto the floor.  Have a firm chair that has side arms. You can use this for support while you get dressed.  Do not have throw rugs and other things on the floor that can make you trip. What can I do in the kitchen?  Clean up any spills right away.  Avoid walking on wet floors.  Keep items that you use a lot in easy-to-reach places.  If you need to reach something above you, use a strong step stool that has a grab bar.  Keep electrical cords out of the way.  Do not use floor polish or wax that makes floors slippery. If you must use wax, use non-skid floor wax.  Do not have throw rugs and other things on the floor that can make you trip. What can I do with my stairs?  Do not leave any items on  the stairs.  Make sure that there are handrails on both sides of the stairs and use them. Fix handrails that are broken or loose. Make sure that handrails are as long as the stairways.  Check any carpeting to make sure that it is firmly attached to the stairs. Fix any carpet that is loose or worn.  Avoid having throw rugs at the top or bottom of the stairs. If you do have throw rugs, attach them to the floor with carpet tape.  Make sure that you have a light switch at the top of the stairs and the bottom of the stairs. If you do not have them, ask someone to add them for you. What else can I do to help prevent falls?  Wear shoes that:  Do not have high heels.  Have rubber bottoms.  Are comfortable and fit you well.  Are closed at the toe. Do not wear sandals.  If you use a stepladder:  Make sure that it is fully opened. Do not climb a closed stepladder.  Make sure that both  sides of the stepladder are locked into place.  Ask someone to hold it for you, if possible.  Clearly mark and make sure that you can see:  Any grab bars or handrails.  First and last steps.  Where the edge of each step is.  Use tools that help you move around (mobility aids) if they are needed. These include:  Canes.  Walkers.  Scooters.  Crutches.  Turn on the lights when you go into a dark area. Replace any light bulbs as soon as they burn out.  Set up your furniture so you have a clear path. Avoid moving your furniture around.  If any of your floors are uneven, fix them.  If there are any pets around you, be aware of where they are.  Review your medicines with your doctor. Some medicines can make you feel dizzy. This can increase your chance of falling. Ask your doctor what other things that you can do to help prevent falls. This information is not intended to replace advice given to you by your health care provider. Make sure you discuss any questions you have with your health care provider. Document Released: 07/17/2009 Document Revised: 02/26/2016 Document Reviewed: 10/25/2014 Elsevier Interactive Patient Education  2017 [employer].

## 2020-12-04 DIAGNOSIS — E1142 Type 2 diabetes mellitus with diabetic polyneuropathy: Secondary | ICD-10-CM | POA: Diagnosis not present

## 2020-12-08 DIAGNOSIS — E1065 Type 1 diabetes mellitus with hyperglycemia: Secondary | ICD-10-CM | POA: Diagnosis not present

## 2020-12-18 ENCOUNTER — Other Ambulatory Visit: Payer: Self-pay | Admitting: Nurse Practitioner

## 2021-01-04 DIAGNOSIS — E1142 Type 2 diabetes mellitus with diabetic polyneuropathy: Secondary | ICD-10-CM | POA: Diagnosis not present

## 2021-01-08 DIAGNOSIS — E1065 Type 1 diabetes mellitus with hyperglycemia: Secondary | ICD-10-CM | POA: Diagnosis not present

## 2021-01-09 ENCOUNTER — Other Ambulatory Visit: Payer: Self-pay | Admitting: Nurse Practitioner

## 2021-01-09 MED ORDER — SIMVASTATIN 40 MG PO TABS: 40.0000 mg | ORAL_TABLET | Freq: Every day | ORAL | 1 refills | Status: DC

## 2021-01-09 MED ORDER — FLUOXETINE HCL 40 MG PO CAPS: ORAL_CAPSULE | ORAL | 1 refills | Status: DC

## 2021-01-09 MED ORDER — OMEPRAZOLE 20 MG PO CPDR: DELAYED_RELEASE_CAPSULE | ORAL | 3 refills | Status: DC

## 2021-01-09 MED ORDER — RAMIPRIL 2.5 MG PO CAPS: ORAL_CAPSULE | ORAL | 1 refills | Status: DC

## 2021-01-09 MED ORDER — TRAZODONE HCL 50 MG PO TABS: ORAL_TABLET | ORAL | 1 refills | Status: DC

## 2021-01-09 NOTE — Telephone Encounter (Signed)
Medication: omeprazole (PRILOSEC) 20 MG capsule [[id number]] , FLUoxetine (PROZAC) 40 MG capsule [[id number]] , simvastatin (ZOCOR) 40 MG tablet [[id number]] , ramipril (ALTACE) 2.5 MG capsule [[id number]] , traZODone (DESYREL) 50 MG tablet [[id number]]   Has the patient contacted their pharmacy? YES (Agent: If no, request that the patient contact the pharmacy for the refill.) (Agent: If yes, when and what did the pharmacy advise?)  Preferred Pharmacy (with phone number or street name): [facility], [address], [place], [place] [address] Phone: [phone] Fax: [phone] Hours: Not open 24 hours    Agent: Please be advised that RX refills may take up to 3 business days. We ask that you follow-up with your pharmacy.

## 2021-01-09 NOTE — Telephone Encounter (Signed)
Future visit in 3 months  

## 2021-02-02 DIAGNOSIS — Z794 Long term (current) use of insulin: Secondary | ICD-10-CM | POA: Diagnosis not present

## 2021-02-02 DIAGNOSIS — M8588 Other specified disorders of bone density and structure, other site: Secondary | ICD-10-CM | POA: Diagnosis not present

## 2021-02-02 DIAGNOSIS — R809 Proteinuria, unspecified: Secondary | ICD-10-CM | POA: Diagnosis not present

## 2021-02-02 DIAGNOSIS — E1129 Type 2 diabetes mellitus with other diabetic kidney complication: Secondary | ICD-10-CM | POA: Diagnosis not present

## 2021-02-02 DIAGNOSIS — E1142 Type 2 diabetes mellitus with diabetic polyneuropathy: Secondary | ICD-10-CM | POA: Diagnosis not present

## 2021-02-03 DIAGNOSIS — E1142 Type 2 diabetes mellitus with diabetic polyneuropathy: Secondary | ICD-10-CM | POA: Diagnosis not present

## 2021-02-07 DIAGNOSIS — E1065 Type 1 diabetes mellitus with hyperglycemia: Secondary | ICD-10-CM | POA: Diagnosis not present

## 2021-02-09 DIAGNOSIS — M8589 Other specified disorders of bone density and structure, multiple sites: Secondary | ICD-10-CM | POA: Diagnosis not present

## 2021-02-09 DIAGNOSIS — E1142 Type 2 diabetes mellitus with diabetic polyneuropathy: Secondary | ICD-10-CM | POA: Diagnosis not present

## 2021-02-09 DIAGNOSIS — E1129 Type 2 diabetes mellitus with other diabetic kidney complication: Secondary | ICD-10-CM | POA: Diagnosis not present

## 2021-02-09 DIAGNOSIS — Z8781 Personal history of (healed) traumatic fracture: Secondary | ICD-10-CM | POA: Diagnosis not present

## 2021-02-09 DIAGNOSIS — E1122 Type 2 diabetes mellitus with diabetic chronic kidney disease: Secondary | ICD-10-CM | POA: Diagnosis not present

## 2021-02-09 DIAGNOSIS — Z794 Long term (current) use of insulin: Secondary | ICD-10-CM | POA: Diagnosis not present

## 2021-02-09 DIAGNOSIS — N1831 Chronic kidney disease, stage 3a: Secondary | ICD-10-CM | POA: Diagnosis not present

## 2021-02-09 DIAGNOSIS — R809 Proteinuria, unspecified: Secondary | ICD-10-CM | POA: Diagnosis not present

## 2021-02-16 ENCOUNTER — Other Ambulatory Visit: Payer: Self-pay | Admitting: Nurse Practitioner

## 2021-02-16 NOTE — Telephone Encounter (Signed)
Requested Prescriptions  Pending Prescriptions Disp Refills  . simvastatin (ZOCOR) 40 MG tablet [Pharmacy Med Name: SIMVASTATIN 40 MG TABLET] 90 tablet 1    Sig: TAKE 1 TABLET BY MOUTH EVERY DAY     Cardiovascular:  Antilipid - Statins Passed - 02/16/2021  1:33 AM      Passed - Total Cholesterol in normal range and within 360 days    Cholesterol, Total  Date Value Ref Range Status  11/04/2020 128 100 - 199 mg/dL Final   Cholesterol Piccolo, Waived  Date Value Ref Range Status  05/21/2015 125 <200 mg/dL Final    Comment:                            Desirable                <200                         Borderline High      200- 239                         High                     >239          Passed - LDL in normal range and within 360 days    LDL Chol Calc (NIH)  Date Value Ref Range Status  11/04/2020 42 0 - 99 mg/dL Final         Passed - HDL in normal range and within 360 days    HDL  Date Value Ref Range Status  11/04/2020 66 >39 mg/dL Final         Passed - Triglycerides in normal range and within 360 days    Triglycerides  Date Value Ref Range Status  11/04/2020 114 0 - 149 mg/dL Final   Triglycerides Piccolo,Waived  Date Value Ref Range Status  05/21/2015 225 (H) <150 mg/dL Final    Comment:                            Normal                   <150                         Borderline High     150 - 199                         High                200 - 499                         Very High                >499          Passed - Patient is not pregnant      Passed - Valid encounter within last 12 months    Recent Outpatient Visits          3 months ago Uncontrolled type 2 diabetes mellitus with chronic kidney disease ([place])   [place], Jolene T, NP   9 months ago Uncontrolled type 2 diabetes mellitus  with chronic kidney disease ([place])   [place] [name], [name], NP   11 months ago Dizziness   [place], Yaurel T, NP   1 year ago Uncontrolled type 2 diabetes mellitus with chronic kidney disease ([place])   [place], Jolene T, NP   1 year ago Type 2 diabetes mellitus with microalbuminuria, with long-term current use of insulin ([place])   [place], [name], NP      Future Appointments            In 2 months [name], [name], NP [facility]   In 9 months  [facility]

## 2021-03-06 DIAGNOSIS — E1142 Type 2 diabetes mellitus with diabetic polyneuropathy: Secondary | ICD-10-CM | POA: Diagnosis not present

## 2021-03-30 ENCOUNTER — Encounter: Payer: Self-pay | Admitting: Nurse Practitioner

## 2021-04-05 DIAGNOSIS — E1142 Type 2 diabetes mellitus with diabetic polyneuropathy: Secondary | ICD-10-CM | POA: Diagnosis not present

## 2021-04-15 DIAGNOSIS — E1065 Type 1 diabetes mellitus with hyperglycemia: Secondary | ICD-10-CM | POA: Diagnosis not present

## 2021-04-27 ENCOUNTER — Ambulatory Visit (INDEPENDENT_AMBULATORY_CARE_PROVIDER_SITE_OTHER): Payer: Medicare Other | Admitting: Nurse Practitioner

## 2021-04-27 ENCOUNTER — Encounter: Payer: Self-pay | Admitting: Nurse Practitioner

## 2021-04-27 DIAGNOSIS — U071 COVID-19: Secondary | ICD-10-CM

## 2021-04-27 DIAGNOSIS — Z8616 Personal history of COVID-19: Secondary | ICD-10-CM | POA: Insufficient documentation

## 2021-04-27 MED ORDER — MOLNUPIRAVIR EUA 200MG CAPSULE: 4.0000 | ORAL_CAPSULE | Freq: Two times a day (BID) | ORAL | 0 refills | Status: AC

## 2021-04-27 MED ORDER — BENZONATATE 100 MG PO CAPS: 100.0000 mg | ORAL_CAPSULE | Freq: Three times a day (TID) | ORAL | 2 refills | Status: DC | PRN

## 2021-04-27 MED ORDER — ALBUTEROL SULFATE HFA 108 (90 BASE) MCG/ACT IN AERS: 2.0000 | INHALATION_SPRAY | Freq: Four times a day (QID) | RESPIRATORY_TRACT | 0 refills | Status: DC | PRN

## 2021-04-27 NOTE — Assessment & Plan Note (Signed)
Positive on home testing yesterday with symptoms starting on 04/23/21 (placing at day 5 exactly). Discussed outpatient therapy with her, husband is currently taking oral medication.  Will send in [place], educated on medication use and side effects.  Recommend OTC diabetic tussin or Coricidin for symptom management.  Will send in Albuterol and Tessalon to use as needed for cough.  Recommend quarantine x 10 days.  If any worsening SOB or symptoms immediately go to ER due to risks.  Return to office in 10 days for follow-up.

## 2021-04-27 NOTE — Patient Instructions (Signed)
COVID-19: What to Do if You Are Sick CDC has updated isolation and quarantine recommendations for the public, and is revising the CDC website to reflect these changes. These recommendations do not apply to healthcare personnel and do not supersede state, local, tribal, or territorial laws, rules, andregulations. If you have a fever, cough or other symptoms, you might have COVID-19. Most people have mild illness and are able to recover at home. If you are sick: Keep track of your symptoms. If you have an emergency warning sign (including trouble breathing), call 911. Steps to help prevent the spread of COVID-19 if you are sick If you are sick with COVID-19 or think you might have COVID-19, follow the steps below to care for yourself and to help protect other peoplein your home and community. Stay home except to get medical care Stay home. Most people with COVID-19 have mild illness and can recover at home without medical care. Do not leave your home, except to get medical care. Do not visit public areas. Take care of yourself. Get rest and stay hydrated. Take over-the-counter medicines, such as acetaminophen, to help you feel better. Stay in touch with your doctor. Call before you get medical care. Be sure to get care if you have trouble breathing, or have any other emergency warning signs, or if you think it is an emergency. Avoid public transportation, ride-sharing, or taxis. Separate yourself from other people As much as possible, stay in a specific room and away from other people and pets in your home. If possible, you should use a separate bathroom. If you need to be around other people or animals in oroutside of the home, wear a mask. Tell your close contactsthat they may have been exposed to COVID-19. An infected person can spread COVID-19 starting 48 hours (or 2 days) before the person has any symptoms or tests positive. By letting your close contacts know they may have been exposed to COVID-19,  you are helping to protect everyone. Additional guidance is available for those living in close quarters and shared housing. See COVID-19 and Animals if you have questions about pets. If you are diagnosed with COVID-19, someone from the health department may call you. Answer the call to slow the spread. Monitor your symptoms Symptoms of COVID-19 include fever, cough, or other symptoms. Follow care instructions from your healthcare provider and local health department. Your local health authorities may give instructions on checking your symptoms and reporting information. When to seek emergency medical attention Look for emergency warning signs* for COVID-19. If someone is showing any of these signs, seek emergency medical care immediately: Trouble breathing Persistent pain or pressure in the chest New confusion Inability to wake or stay awake Pale, gray, or blue-colored skin, lips, or nail beds, depending on skin tone *This list is not all possible symptoms. Please call your medical provider forany other symptoms that are severe or concerning to you. Call 911 or call ahead to your local emergency facility: Notify the operator that you are seeking care for someone who has or may haveCOVID-19. Call ahead before visiting your doctor Call ahead. Many medical visits for routine care are being postponed or done by phone or telemedicine. If you have a medical appointment that cannot be postponed, call your doctor's office, and tell them you have or may have COVID-19. This will help the office protect themselves and other patients. Get tested If you have symptoms of COVID-19, get tested. While waiting for test results, you stay away from others,  including staying apart from those living in your household. Self-tests are one of several options for testing for the virus that causes COVID-19 and may be more convenient than laboratory-based tests and point-of-care tests. Ask your healthcare provider or  your local health department if you need help interpreting your test results. You can visit your state, tribal, local, and territorial health department's website to look for the latest local information on testing sites. If you are sick, wear a mask over your nose and mouth You should wear a mask over your nose and mouth if you must be around other people or animals, including pets (even at home). You don't need to wear the mask if you are alone. If you can't put on a mask (because of trouble breathing, for example), cover your coughs and sneezes in some other way. Try to stay at least 6 feet away from other people. This will help protect the people around you. Masks should not be placed on young children under age 23 years, anyone who has trouble breathing, or anyone who is not able to remove the mask without help. Note: During the COVID-19 pandemic, medical grade facemasks are reserved forhealthcare workers and some first responders. Cover your coughs and sneezes Cover your mouth and nose with a tissue when you cough or sneeze. Throw away used tissues in a lined trash can. Immediately wash your hands with soap and water for at least 20 seconds. If soap and water are not available, clean your hands with an alcohol-based hand sanitizer that contains at least 60% alcohol. Clean your hands often Wash your hands often with soap and water for at least 20 seconds. This is especially important after blowing your nose, coughing, or sneezing; going to the bathroom; and before eating or preparing food. Use hand sanitizer if soap and water are not available. Use an alcohol-based hand sanitizer with at least 60% alcohol, covering all surfaces of your hands and rubbing them together until they feel dry. Soap and water are the best option, especially if hands are visibly dirty. Avoid touching your eyes, nose, and mouth with unwashed hands. Handwashing Tips Avoid sharing personal household items Do not share  dishes, drinking glasses, cups, eating utensils, towels, or bedding with other people in your home. Wash these items thoroughly after using them with soap and water or put in the dishwasher. Clean all "high-touch" surfaces every day Clean and disinfect high-touch surfaces in your "sick room" and bathroom; wear disposable gloves. Let someone else clean and disinfect surfaces in common areas, but you should clean your bedroom and bathroom, if possible. If a caregiver or other person needs to clean and disinfect a sick person's bedroom or bathroom, they should do so on an as-needed basis. The caregiver/other person should wear a mask and disposable gloves prior to cleaning. They should wait as long as possible after the person who is sick has used the bathroom before coming in to clean and use the bathroom. High-touch surfaces include phones, remote controls, counters, tabletops, doorknobs, bathroom fixtures, toilets, keyboards, tablets, and bedside tables. Clean and disinfect areas that may have blood, stool, or body fluids on them. Use household cleaners and disinfectants. Clean the area or item with soap and water or another detergent if it is dirty. Then, use a household disinfectant. Be sure to follow the instructions on the label to ensure safe and effective use of the product. Many products recommend keeping the surface wet for several minutes to ensure germs are killed. Many  also recommend precautions such as wearing gloves and making sure you have good ventilation during use of the product. Use a product from [employer] List N: Disinfectants for Coronavirus ([id number]). Complete Disinfection Guidance When you can be around others after being sick with COVID-19 Deciding when you can be around others is different for different situations. Find out when you can safely end home isolation. For any additional questions about your care,contact your healthcare provider or state or local health  department. 09/10/2020 Content source: [facility] ([facility]), Division of Viral Diseases This information is not intended to replace advice given to you by your health care provider. Make sure you discuss any questions you have with your healthcare provider. Document Revised: 11/07/2020 Document Reviewed: 11/07/2020 Elsevier Patient Education  [place].

## 2021-04-27 NOTE — Progress Notes (Signed)
BP (!) 85/52   Pulse 86   Temp 98.7 F (37.1 C)   LMP  (LMP Unknown)    Subjective:    Patient ID: Jessica Montoya, female    DOB: 1950/10/15, 70 y.o.   MRN: [id number]  HPI: Jessica Montoya is a 70 y.o. female  Chief Complaint  Patient presents with   Covid Positive    Patient states last Thursday she tested and the results was negative. Patient states she re-tested yesterday and her results were positive. Patient is complaining of loss of taste and smell, congestion, dizziness, headaches and diarrhea. Patient states she is running a low-grade fever. Patient denies trying any OTC medications. Patient states her husband was given a prescription last week on Thursday when he tested positive.     This visit was completed via telephone due to the restrictions of the COVID-19 pandemic. All issues as above were discussed and addressed but no physical exam was performed. If it was felt that the patient should be evaluated in the office, they were directed there. The patient verbally consented to this visit. Patient was unable to complete an audio/visual visit due to Lack of equipment. Due to the catastrophic nature of the COVID-19 pandemic, this visit was done through audio contact only. Location of the patient: home Location of the provider: work Those involved with this call:  Provider: [name], DNP CMA: Irena Reichmann, [place] Desk/Registration: Jill Side  Time spent on call:  21 minutes on the phone discussing health concerns. 15 minutes total spent in review of patient's record and preparation of their chart.  I verified patient identity using two factors (patient name and date of birth). Patient consents verbally to being seen via telemedicine visit today.    COVID INFECTION Symptoms started on Thursday 04/23/21, tested negative that day and then tested positive yesterday.  She has history of Covid infection on 10/19/2019 treated with MAB. Fever: yes low  grade Cough: yes Shortness of breath: no Wheezing: no Chest pain: no Chest tightness: no Chest congestion: yes Nasal congestion: yes Runny nose: yes Post nasal drip: yes Sneezing: no Sore throat: yes Swollen glands: no Sinus pressure: yes Headache: yes Face pain: no Toothache: no Ear pain: none Ear pressure: none Eyes red/itching:no Eye drainage/crusting: no  Vomiting:  nausea and x one episode of throwing up yesterday afernoon Rash: no Fatigue: yes Sick contacts: yes Strep contacts: no  Context: fluctuating Recurrent sinusitis: no Relief with OTC cold/cough medications: no  Treatments attempted: none    Relevant past medical, surgical, family and social history reviewed and updated as indicated. Interim medical history since our last visit reviewed. Allergies and medications reviewed and updated.  Review of Systems  Constitutional:  Positive for fatigue and fever. Negative for activity change and appetite change.  HENT:  Positive for congestion, postnasal drip, rhinorrhea and sinus pressure. Negative for ear discharge, ear pain, facial swelling, sinus pain, sneezing, sore throat and voice change.   Eyes:  Negative for pain and visual disturbance.  Respiratory:  Positive for cough and chest tightness. Negative for shortness of breath and wheezing.   Cardiovascular:  Negative for chest pain, palpitations and leg swelling.  Gastrointestinal:  Positive for nausea. Negative for abdominal distention, abdominal pain, constipation, diarrhea and vomiting.  Endocrine: Negative.   Musculoskeletal:  Positive for myalgias.  Neurological:  Positive for headaches. Negative for dizziness and numbness.  Psychiatric/Behavioral: Negative.     Per HPI unless specifically indicated above  Objective:    BP (!) 85/52   Pulse 86   Temp 98.7 F (37.1 C)   LMP  (LMP Unknown)   Wt Readings from Last 3 Encounters:  12/01/20 148 lb (67.1 kg)  11/04/20 152 lb 3.2 oz (69 kg)  04/03/20  158 lb (71.7 kg)    Physical Exam  Unable to perform due to telephone visit only.  Results for orders placed or performed in visit on 11/04/20  Lipid Panel w/o Chol/HDL Ratio  Result Value Ref Range   Cholesterol, Total 128 100 - 199 mg/dL   Triglycerides 114 0 - 149 mg/dL   HDL 66 >39 mg/dL   VLDL Cholesterol Cal 20 5 - 40 mg/dL   LDL Chol Calc (NIH) 42 0 - 99 mg/dL  Comprehensive metabolic panel  Result Value Ref Range   Glucose 155 (H) 65 - 99 mg/dL   BUN 13 8 - 27 mg/dL   Creatinine, Ser 1.15 (H) 0.57 - 1.00 mg/dL   GFR calc non Af Amer 48 (L) >59 mL/min/1.73   GFR calc Af Amer 56 (L) >59 mL/min/1.73   BUN/Creatinine Ratio 11 (L) 12 - 28   Sodium 144 134 - 144 mmol/L   Potassium 4.3 3.5 - 5.2 mmol/L   Chloride 105 96 - 106 mmol/L   CO2 27 20 - 29 mmol/L   Calcium 9.6 8.7 - 10.3 mg/dL   Total Protein 6.7 6.0 - 8.5 g/dL   Albumin 4.2 3.8 - 4.8 g/dL   Globulin, Total 2.5 1.5 - 4.5 g/dL   Albumin/Globulin Ratio 1.7 1.2 - 2.2   Bilirubin Total 0.6 0.0 - 1.2 mg/dL   Alkaline Phosphatase 74 44 - 121 IU/L   AST 20 0 - 40 IU/L   ALT 17 0 - 32 IU/L  TSH  Result Value Ref Range   TSH 1.080 0.450 - 4.500 uIU/mL  VITAMIN D 25 Hydroxy (Vit-D Deficiency, Fractures)  Result Value Ref Range   Vit D, 25-Hydroxy 27.8 (L) 30.0 - 100.0 ng/mL      Assessment & Plan:   Problem List Items Addressed This Visit       Other   Lab test positive for detection of COVID-19 virus    Positive on home testing yesterday with symptoms starting on 04/23/21 (placing at day 5 exactly). Discussed outpatient therapy with her, husband is currently taking oral medication.  Will send in [place], educated on medication use and side effects.  Recommend OTC diabetic tussin or Coricidin for symptom management.  Will send in Albuterol and Tessalon to use as needed for cough.  Recommend quarantine x 10 days.  If any worsening SOB or symptoms immediately go to ER due to risks.  Return to office in 10 days for  follow-up.        I discussed the assessment and treatment plan with the patient. The patient was provided an opportunity to ask questions and all were answered. The patient agreed with the plan and demonstrated an understanding of the instructions.   The patient was advised to call back or seek an in-person evaluation if the symptoms worsen or if the condition fails to improve as anticipated.   I provided 21+ minutes of time during this encounter.   Follow up plan: Return in about 10 days (around 05/07/2021) for Covid lung check.

## 2021-04-28 ENCOUNTER — Telehealth: Payer: Self-pay

## 2021-04-28 NOTE — Telephone Encounter (Signed)
-----   Message from [name], NP sent at 04/27/2021  2:10 PM EDT ----- 10 days in office for Covid lung check

## 2021-04-28 NOTE — Telephone Encounter (Signed)
UNABLE TO LVM TO MAKE THIS APT

## 2021-05-01 NOTE — Telephone Encounter (Signed)
APT SCHEDULED PER P[facility]

## 2021-05-04 ENCOUNTER — Ambulatory Visit: Payer: Medicare Other | Admitting: Nurse Practitioner

## 2021-05-04 IMAGING — DX DG CHEST 1V PORT
1 series · 1 of 1 positions shown · non-contrast
Comparison: 11/29/2012

CLINICAL DATA: Weakness

EXAM:
PORTABLE CHEST 1 VIEW

[chest ap]
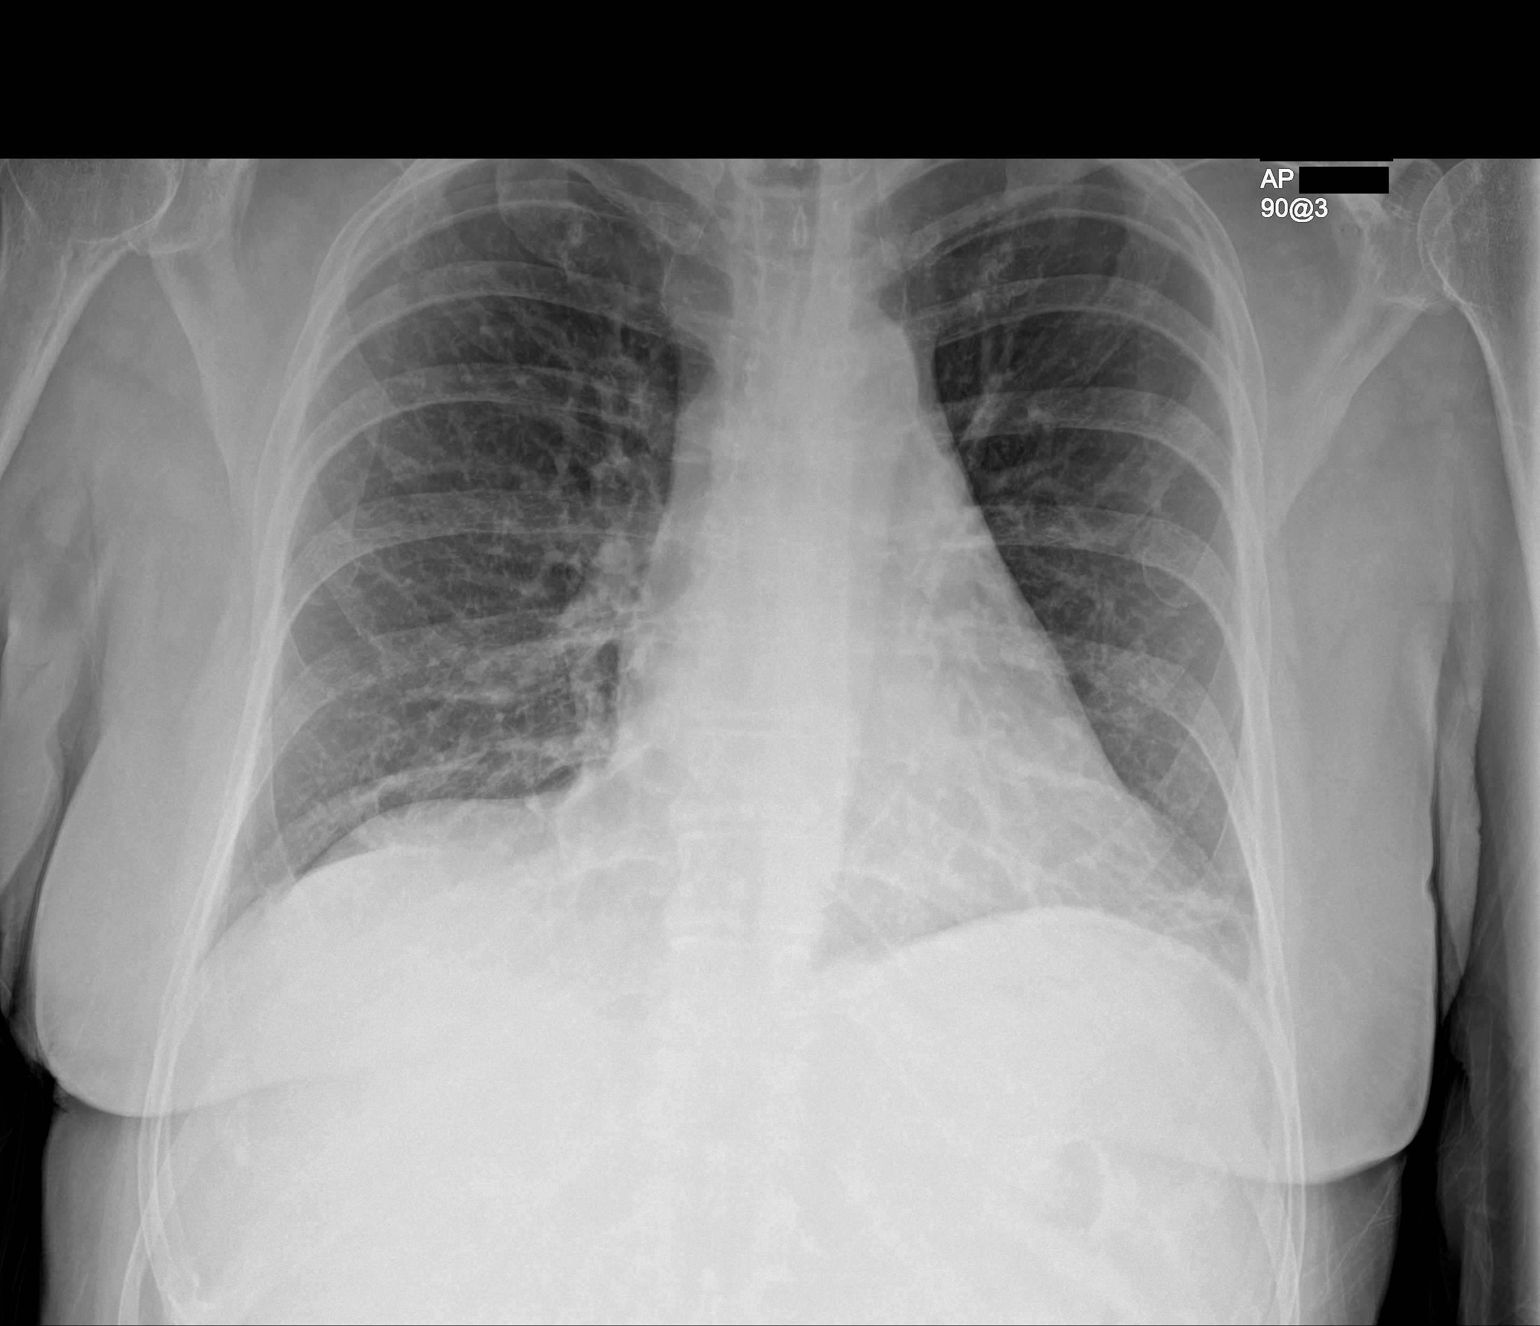

[1 of 1 positions shown; findings below may reference images not displayed]

FINDINGS: Cardiac shadows within normal limits. The lungs are well aerated
bilaterally. No focal infiltrate is seen. Minimal platelike
atelectasis in the left base is noted. No bony abnormality is seen.
IMPRESSION: Minimal left basilar atelectasis.

## 2021-05-06 DIAGNOSIS — E1142 Type 2 diabetes mellitus with diabetic polyneuropathy: Secondary | ICD-10-CM | POA: Diagnosis not present

## 2021-05-15 ENCOUNTER — Encounter: Payer: Self-pay | Admitting: Nurse Practitioner

## 2021-05-15 ENCOUNTER — Other Ambulatory Visit: Payer: Self-pay

## 2021-05-15 ENCOUNTER — Ambulatory Visit (INDEPENDENT_AMBULATORY_CARE_PROVIDER_SITE_OTHER): Payer: Medicare Other | Admitting: Nurse Practitioner

## 2021-05-15 VITALS — BP 100/61 | HR 80 | Temp 98.1°F | Wt 153.6 lb

## 2021-05-15 DIAGNOSIS — I951 Orthostatic hypotension: Secondary | ICD-10-CM | POA: Diagnosis not present

## 2021-05-15 DIAGNOSIS — R809 Proteinuria, unspecified: Secondary | ICD-10-CM

## 2021-05-15 DIAGNOSIS — I152 Hypertension secondary to endocrine disorders: Secondary | ICD-10-CM

## 2021-05-15 DIAGNOSIS — E559 Vitamin D deficiency, unspecified: Secondary | ICD-10-CM

## 2021-05-15 DIAGNOSIS — Z8616 Personal history of COVID-19: Secondary | ICD-10-CM

## 2021-05-15 DIAGNOSIS — Z794 Long term (current) use of insulin: Secondary | ICD-10-CM | POA: Diagnosis not present

## 2021-05-15 DIAGNOSIS — Z1231 Encounter for screening mammogram for malignant neoplasm of breast: Secondary | ICD-10-CM

## 2021-05-15 DIAGNOSIS — E1142 Type 2 diabetes mellitus with diabetic polyneuropathy: Secondary | ICD-10-CM

## 2021-05-15 DIAGNOSIS — E538 Deficiency of other specified B group vitamins: Secondary | ICD-10-CM

## 2021-05-15 DIAGNOSIS — E785 Hyperlipidemia, unspecified: Secondary | ICD-10-CM | POA: Diagnosis not present

## 2021-05-15 DIAGNOSIS — E1129 Type 2 diabetes mellitus with other diabetic kidney complication: Secondary | ICD-10-CM

## 2021-05-15 DIAGNOSIS — M85851 Other specified disorders of bone density and structure, right thigh: Secondary | ICD-10-CM | POA: Diagnosis not present

## 2021-05-15 DIAGNOSIS — E1159 Type 2 diabetes mellitus with other circulatory complications: Secondary | ICD-10-CM | POA: Diagnosis not present

## 2021-05-15 DIAGNOSIS — E1169 Type 2 diabetes mellitus with other specified complication: Secondary | ICD-10-CM

## 2021-05-15 NOTE — Assessment & Plan Note (Signed)
Chronic, ongoing.  Is scheduled for repeat DEXA in March.  Continue Vitamin D supplement and recheck Vit D level today.  Collaborate with endo on changes in future if needed.

## 2021-05-15 NOTE — Assessment & Plan Note (Addendum)
Chronic, ongoing, followed by endo.  A1C with them 5.9% recently with endo, praised for success. Continue current regimen at this time, defer changes to endo, and collaboration with endo + CCM collaboration.  Maintain off Gabapentin at this time due to improved fatigue and dizziness without it.  Return in 6 months.

## 2021-05-15 NOTE — Assessment & Plan Note (Signed)
Orthostatic BP readings noted with position changes, stop Ramipril.  Recommend she start wearing compression hose on during day and off at night.  Could consider addition of Florinef if needed in future.  Recommend increase fluid intake at home.  If BP consistently <130/80 then let provider know ASAP and we will add on Florinef as mentioned above.  Recommend she schedule follow-up with cardiology.

## 2021-05-15 NOTE — Assessment & Plan Note (Signed)
Chronic, ongoing.  Continue collaboration with endocrinology.

## 2021-05-15 NOTE — Assessment & Plan Note (Signed)
Chronic, stable with BP below goal, on orthostatic side and having some dizziness with position changes.  Discontinue Altace, would benefit for kidney protection, but at this time must take risk of orthostatic BPs and falls into consideration.  Recommend she continue to monitor BP at home daily and document + focus on DASH diet.  CMP up to date, urine micro today.  Return in 6 months.

## 2021-05-15 NOTE — Assessment & Plan Note (Signed)
Ongoing, continue daily supplement and recheck B12 level today.

## 2021-05-15 NOTE — Assessment & Plan Note (Signed)
Ongoing, continue daily supplement and recheck Vit D level today.

## 2021-05-15 NOTE — Progress Notes (Signed)
BP 100/61 (BP Location: Left Arm, Patient Position: Standing)   Pulse 80   Temp 98.1 F (36.7 C) (Oral)   Wt 153 lb 9.6 oz (69.7 kg)   LMP  (LMP Unknown)   SpO2 98%   BMI 24.42 kg/m    Subjective:    Patient ID: Jessica Montoya, female    DOB: [birth year], 70 y.o.   MRN: [id number]  HPI: Jessica Montoya is a 70 y.o. female  Chief Complaint  Patient presents with   Diabetes    Patient has not had a recent Diabetic Eye Exam due next month.    Hypertension   Hyperlipidemia   Osteopenia   Mood   Fatigue    Patient states she recently got covid and states she is still having extreme fatigue and states it is the same when she had covid prior to her most recent diagnosis.   DIABETES Is followed by Dr. [name], last saw on 02/09/21 -- A1c was 5.9%. Taking U500 20 units in morning and then 2 PM none, and at night 20 units QHS.  Continues on Trulicity 1.5 MG weekly.  Has tried Metformin (GI upset), only oral medication she has tried (it is on last endo note as continuing to take, but patient reports not taking this ).  Is being followed by CCM team for assistance with insulin.  Sees Dr. [name] on the 14th. Hypoglycemic episodes: 56 today Polydipsia/polyuria: no Visual disturbance: no Chest pain: no Paresthesias: no Glucose Monitoring: yes             Accucheck frequency: TID with Libre             Fasting glucose: sometimes 110 and sometimes 280             Post prandial:              Evening: 160 range at night             Before meals:  Taking Insulin?: yes             Long acting insulin: as noted above             Short acting insulin: Blood Pressure Monitoring: rarely Retinal Examination: Not Up to Date -- [place] [facility] Foot Exam: Up to Date Pneumovax: Up to Date Influenza: Refused Aspirin: yes    HYPERTENSION / HYPERLIPIDEMIA Continues on Ramipril and Simvastatin.  Is seeing cardiologist Dr. Fletcher Anon, last 04/03/20 -- does not have follow-up appointment.  Has been  running lower blood pressures at home -- notices dizziness when changing position.  Is getting good fluid intake at home. Satisfied with current treatment? yes Duration of hypertension: chronic BP monitoring frequency: rarely BP range: 88/64 to 100/80's BP medication side effects: no Duration of hyperlipidemia: chronic Cholesterol medication side effects: no Cholesterol supplements: none Medication compliance: good compliance Aspirin: no Recent stressors: no Recurrent headaches: no Visual changes: no Palpitations: no Dyspnea: no Chest pain: no Lower extremity edema: no Dizzy/lightheaded: no  OSTEOPENIA Had DEXA on 02/02/21. Satisfied with current treatment?: yes Past osteoporosis medications/treatments: none Adequate calcium & vitamin D: yes Intolerance to bisphosphonates: none Weight bearing exercises: yes   DEPRESSION Continues on Trazodone and Prozac.  Does endorse increased fatigue post Covid. Has second bout of Covid on 04/27/21 -- treated with Molnupiravir. Mood status: stable Satisfied with current treatment?: yes Symptom severity: mild  Duration of current treatment : chronic Side effects: no Medication compliance: good compliance Psychotherapy/counseling: none Depressed mood: occasional  Anxious mood: no Anhedonia: no Significant weight loss or gain: no Insomnia: yes hard to fall asleep Fatigue: no Feelings of worthlessness or guilt: no Impaired concentration/indecisiveness: no Suicidal ideations: no Hopelessness: no Crying spells: no Depression screen [facility] 2/9 05/15/2021 12/01/2020 11/04/2020 01/28/2020 01/16/2020  Decreased Interest 2 0 0 1 0  Down, Depressed, Hopeless 1 0 0 1 0  PHQ - 2 Score 3 0 0 2 0  Altered sleeping 3 3 0 2 -  Tired, decreased energy 3 3 0 3 -  Change in appetite 2 0 0 1 -  Feeling bad or failure about yourself  3 0 0 0 -  Trouble concentrating 2 0 0 0 -  Moving slowly or fidgety/restless 2 0 0 0 -  Suicidal thoughts 1 0 0 0 -  PHQ-9  Score 19 6 0 8 -  Difficult doing work/chores Somewhat difficult - - Not difficult at all -  Some recent data might be hidden    Relevant past medical, surgical, family and social history reviewed and updated as indicated. Interim medical history since our last visit reviewed. Allergies and medications reviewed and updated.  Review of Systems  Constitutional:  Negative for activity change, appetite change, diaphoresis, fatigue and fever.  Respiratory:  Negative for cough, chest tightness and shortness of breath.   Cardiovascular:  Negative for chest pain, palpitations and leg swelling.  Gastrointestinal: Negative.   Endocrine: Negative for cold intolerance, heat intolerance, polydipsia, polyphagia and polyuria.  Neurological: Negative.   Psychiatric/Behavioral:  Negative for decreased concentration, self-injury, sleep disturbance and suicidal ideas. The patient is not nervous/anxious.    Per HPI unless specifically indicated above     Objective:    BP 100/61 (BP Location: Left Arm, Patient Position: Standing)   Pulse 80   Temp 98.1 F (36.7 C) (Oral)   Wt 153 lb 9.6 oz (69.7 kg)   LMP  (LMP Unknown)   SpO2 98%   BMI 24.42 kg/m   Wt Readings from Last 3 Encounters:  05/15/21 153 lb 9.6 oz (69.7 kg)  12/01/20 148 lb (67.1 kg)  11/04/20 152 lb 3.2 oz (69 kg)    Physical Exam Vitals and nursing note reviewed.  Constitutional:      General: She is awake. She is not in acute distress.    Appearance: She is well-developed and well-groomed. She is not ill-appearing.  HENT:     Head: Normocephalic.     Right Ear: Hearing, tympanic membrane, ear canal and external ear normal.     Left Ear: Hearing, tympanic membrane, ear canal and external ear normal.  Eyes:     General: Lids are normal.        Right eye: No discharge.        Left eye: No discharge.     Conjunctiva/sclera: Conjunctivae normal.     Pupils: Pupils are equal, round, and reactive to light.  Neck:     Thyroid: No  thyromegaly.     Vascular: No carotid bruit.  Cardiovascular:     Rate and Rhythm: Normal rate and regular rhythm.     Heart sounds: Normal heart sounds. No murmur heard.   No gallop.  Pulmonary:     Effort: Pulmonary effort is normal. No accessory muscle usage or respiratory distress.     Breath sounds: Normal breath sounds.  Abdominal:     General: Bowel sounds are normal.     Palpations: Abdomen is soft.     Tenderness: There is no abdominal  tenderness.  Musculoskeletal:     Cervical back: Normal range of motion and neck supple.     Right lower leg: No edema.     Left lower leg: No edema.  Skin:    General: Skin is warm and dry.  Neurological:     Mental Status: She is alert and oriented to person, place, and time.     Cranial Nerves: Cranial nerves are intact.     Coordination: Coordination is intact.     Gait: Gait is intact.     Deep Tendon Reflexes: Reflexes are normal and symmetric.     Reflex Scores:      Brachioradialis reflexes are 2+ on the right side and 2+ on the left side.      Patellar reflexes are 2+ on the right side and 2+ on the left side.    Comments: Uses cane to assist gait.  Psychiatric:        Attention and Perception: Attention normal.        Mood and Affect: Mood normal.        Speech: Speech normal.        Behavior: Behavior normal. Behavior is cooperative.    Results for orders placed or performed in visit on 11/04/20  Lipid Panel w/o Chol/HDL Ratio  Result Value Ref Range   Cholesterol, Total 128 100 - 199 mg/dL   Triglycerides 114 0 - 149 mg/dL   HDL 66 >39 mg/dL   VLDL Cholesterol Cal 20 5 - 40 mg/dL   LDL Chol Calc (NIH) 42 0 - 99 mg/dL  Comprehensive metabolic panel  Result Value Ref Range   Glucose 155 (H) 65 - 99 mg/dL   BUN 13 8 - 27 mg/dL   Creatinine, Ser 1.15 (H) 0.57 - 1.00 mg/dL   GFR calc non Af Amer 48 (L) >59 mL/min/1.73   GFR calc Af Amer 56 (L) >59 mL/min/1.73   BUN/Creatinine Ratio 11 (L) 12 - 28   Sodium 144 134 -  144 mmol/L   Potassium 4.3 3.5 - 5.2 mmol/L   Chloride 105 96 - 106 mmol/L   CO2 27 20 - 29 mmol/L   Calcium 9.6 8.7 - 10.3 mg/dL   Total Protein 6.7 6.0 - 8.5 g/dL   Albumin 4.2 3.8 - 4.8 g/dL   Globulin, Total 2.5 1.5 - 4.5 g/dL   Albumin/Globulin Ratio 1.7 1.2 - 2.2   Bilirubin Total 0.6 0.0 - 1.2 mg/dL   Alkaline Phosphatase 74 44 - 121 IU/L   AST 20 0 - 40 IU/L   ALT 17 0 - 32 IU/L  TSH  Result Value Ref Range   TSH 1.080 0.450 - 4.500 uIU/mL  VITAMIN D 25 Hydroxy (Vit-D Deficiency, Fractures)  Result Value Ref Range   Vit D, 25-Hydroxy 27.8 (L) 30.0 - 100.0 ng/mL      Assessment & Plan:   Problem List Items Addressed This Visit       Cardiovascular and Mediastinum   Hypertension associated with diabetes ([place])    Chronic, stable with BP below goal, on orthostatic side and having some dizziness with position changes.  Discontinue Altace, would benefit for kidney protection, but at this time must take risk of orthostatic BPs and falls into consideration.  Recommend she continue to monitor BP at home daily and document + focus on DASH diet.  CMP up to date, urine micro today.  Return in 6 months.      Relevant Orders   Microalbumin, Urine Waived  Orthostatic hypotension    Orthostatic BP readings noted with position changes, stop Ramipril.  Recommend she start wearing compression hose on during day and off at night.  Could consider addition of Florinef if needed in future.  Recommend increase fluid intake at home.  If BP consistently <130/80 then let provider know ASAP and we will add on Florinef as mentioned above.  Recommend she schedule follow-up with cardiology.        Endocrine   Diabetic neuropathy (HCC)    Chronic, ongoing, followed by endo.  A1C with them 5.9% recently with endo, praised for success. Continue current regimen at this time, defer changes to endo, and collaboration with endo + CCM collaboration.  Maintain off Gabapentin at this time due to improved  fatigue and dizziness without it.  Return in 6 months.      Hyperlipidemia associated with type 2 diabetes mellitus (HCC)    Chronic, ongoing.  Continue current medication regimen and adjust as needed.  Obtain lipid panel today.  Return in 6 months.      Relevant Orders   Lipid Panel w/o Chol/HDL Ratio   Type 2 diabetes mellitus with microalbuminuria (HCC) - Primary    Chronic, ongoing, followed by endo.  A1C 5.9% recently with endo, praised for success. Continue current regimen at this time, defer changes to endo, and collaboration with endo + CCM collaboration.   Urine alb obtained today.  CMP up to date.  Return in 6 months.      Relevant Orders   Microalbumin, Urine Waived     Musculoskeletal and Integument   Osteopenia    Chronic, ongoing.  Is scheduled for repeat DEXA in March.  Continue Vitamin D supplement and recheck Vit D level today.  Collaborate with endo on changes in future if needed.        Other   Long-term insulin use (HCC)    Chronic, ongoing.  Continue collaboration with endocrinology.      Vitamin D deficiency    Ongoing, continue daily supplement and recheck Vit D level today.      Relevant Orders   VITAMIN D 25 Hydroxy (Vit-D Deficiency, Fractures)   Vitamin B12 deficiency    Ongoing, continue daily supplement and recheck B12 level today.      Relevant Orders   Vitamin B12   History of 2019 novel coronavirus disease (COVID-19)    Ongoing fatigue at this time, discussed at length with her.  Recommend increased hydration and adjusting BP medications.      Other Visit Diagnoses     Encounter for screening mammogram for malignant neoplasm of breast       Mammogram ordered today.   Relevant Orders   MM 3D SCREEN BREAST BILATERAL        Follow up plan: Return in about 6 months (around 11/15/2021) for T2DM, HTN/HLD,  MOOD.

## 2021-05-15 NOTE — Assessment & Plan Note (Signed)
Chronic, ongoing.  Continue current medication regimen and adjust as needed.  Obtain lipid panel today.  Return in 6 months.

## 2021-05-15 NOTE — Patient Instructions (Signed)
[facility]  Address: [address], [place], [place] [address]  Phone: [phone]  Mammogram A mammogram is an X-ray of the breasts. This is done to check for changes that are not normal. This test can look for changes that may be caused by breast cancer or other problems. Mammograms are regularly done on women beginning at age 71. A man may have a mammogram if he has a lump or swelling in his breast. Tell a doctor: About any allergies you have. If you have breast implants. If you have had breast disease, biopsy, or surgery. If you have a family history of breast cancer. If you are breastfeeding. Whether you are pregnant or may be pregnant. What are the risks? Generally, this is a safe procedure. But problems may occur, including: Being exposed to radiation. Radiation levels are very low with this test. The need for more tests. The results were not read properly. Trouble finding breast cancer in women with dense breasts. What happens before the test? Have this test done about 1-2 weeks after your menstrual period. This is often when your breasts are the least tender. If you are visiting a new doctor or clinic, have any past mammogram images sent to your new doctor's office. Wash your breasts and under your arms on the day of the test. Do not use deodorants, perfumes, lotions, or powders on the day of the test. Take off any jewelry from your neck. Wear clothes that you can change into and out of easily. What happens during the test?  You will take off your clothes from the waist up. You will put on a gown. You will stand in front of the X-ray machine. Each breast will be placed between two plastic or glass plates. The plates will press down on your breast for a few seconds. Try to relax. This does not cause any harm to your breasts. It may not feel comfortable, but it will be very brief. X-rays will be taken from different angles of each  breast. The procedure may vary among doctors and hospitals. What can I expect after the test? The mammogram will be read by a specialist (radiologist). You may need to do parts of the test again. This depends on the quality of the images. You may go back to your normal activities. It is up to you to get the results of your test. Ask how to get your results when they are ready. Summary A mammogram is an X-ray of the breasts. It looks for changes that may be caused by breast cancer or other problems. A man may have this test if he has a lump or swelling in his breast. Before the test, tell your doctor about any breast problems that you have had in the past. Have this test done about 1-2 weeks after your menstrual period. Ask when your test results will be ready. Make sure you get your test results. This information is not intended to replace advice given to you by your health care provider. Make sure you discuss any questions you have with your health care provider. Document Revised: 07/21/2020 Document Reviewed: 07/21/2020 Elsevier Patient Education  2022 [employer] Inc[employer]   

## 2021-05-15 NOTE — Assessment & Plan Note (Signed)
Chronic, ongoing, followed by endo.  A1C 5.9% recently with endo, praised for success. Continue current regimen at this time, defer changes to endo, and collaboration with endo + CCM collaboration.   Urine alb obtained today.  CMP up to date.  Return in 6 months.

## 2021-05-15 NOTE — Assessment & Plan Note (Signed)
Ongoing fatigue at this time, discussed at length with her.  Recommend increased hydration and adjusting BP medications.

## 2021-05-16 DIAGNOSIS — E1065 Type 1 diabetes mellitus with hyperglycemia: Secondary | ICD-10-CM | POA: Diagnosis not present

## 2021-05-16 LAB — VITAMIN D 25 HYDROXY (VIT D DEFICIENCY, FRACTURES): Vit D, 25-Hydroxy: 29 ng/mL — ABNORMAL LOW (ref 30.0–100.0)

## 2021-05-16 LAB — LIPID PANEL W/O CHOL/HDL RATIO
Cholesterol, Total: 133 mg/dL (ref 100–199)
HDL: 67 mg/dL (ref >39)
LDL Chol Calc (NIH): 44 mg/dL (ref 0–99)
Triglycerides: 126 mg/dL (ref 0–149)
VLDL Cholesterol Cal: 22 mg/dL (ref 5–40)

## 2021-05-16 LAB — VITAMIN B12: Vitamin B-12: 840 pg/mL (ref 232–1245)

## 2021-05-16 NOTE — Progress Notes (Signed)
Please let [name] know her labs have returned.  Cholesterol levels remain stable, continue statin.  Vitamin B12 in normal range and Vitamin D level remains a little low, continue all supplements.  Any questions? Keep being awesome!!  Thank you for allowing me to participate in your care.  I appreciate you. Kindest regards, Dezman Granda

## 2021-05-21 ENCOUNTER — Other Ambulatory Visit: Payer: Self-pay

## 2021-06-16 DIAGNOSIS — E1065 Type 1 diabetes mellitus with hyperglycemia: Secondary | ICD-10-CM | POA: Diagnosis not present

## 2021-06-24 ENCOUNTER — Telehealth: Payer: Self-pay

## 2021-06-24 NOTE — Chronic Care Management (AMB) (Signed)
Chronic Care Management Pharmacy Assistant   Name: Jessica Montoya  MRN: [id number] DOB: 1951/04/14  Jessica Montoya is an 70 y.o. year old female who presents for his follow-up CCM visit with the clinical pharmacist.  Reason for Encounter: Disease State   Recent office visits:  05/15/21 [name] NP PCP- pt seen for Type 2 diabetes mellitus with microalbuminuria, with long-term current use of insulin. Labs were ordered as well as a mammogram. Pt completed course of Albuterol, Benzoate, Hydroxizine, and ramipril. Follow up in 6 months  04/27/21 [name] NP- Pt was seen for positive Covid 19 detection. Pt started Albuterol Sulfate q6h, Benzonatate 100 mg TID, and Molnupiravir 800 mg BID. Follow up 10 days.  11/04/20 [name] NP- pt was seen for uncontrolled type 2 DM. Labs were ordered and pt advised to follow up in 6 months.  Recent consult visits:  [facility] visits:  None in previous 6 months  Medications: Outpatient Encounter Medications as of 06/24/2021  Medication Sig   aspirin 81 MG tablet Take 81 mg by mouth daily.   Cholecalciferol (VITAMIN D PO) Take 2,000 Units by mouth daily.    Dulaglutide (TRULICITY ) Inject 1.5 mg into the skin once a week.   ferrous fumarate (HEMOCYTE - 106 MG FE) 325 (106 FE) MG TABS tablet Take 1 tablet by mouth daily.   FLUoxetine (PROZAC) 40 MG capsule TAKE 1 CAPSULE BY MOUTH EVERY DAY   insulin regular human CONCENTRATED (HUMULIN R) 500 UNIT/ML kwikpen Inject 40 units at 8 AM. Inject 40 units at 2 PM. Inject 20 units at 8 PM.   Multiple Vitamin (MULTIVITAMIN) tablet Take 1 tablet by mouth daily.   omeprazole (PRILOSEC) 20 MG capsule TAKE 1 CAPSULE BY MOUTH EVERY DAY   simvastatin (ZOCOR) 40 MG tablet TAKE 1 TABLET BY MOUTH EVERY DAY   traZODone (DESYREL) 50 MG tablet TAKE 1 TABLET BY MOUTH EVERYDAY AT BEDTIME   vitamin B-12 (CYANOCOBALAMIN) 1000 MCG tablet Take 2,000 mcg by mouth daily.   No  facility-administered encounter medications on file as of 06/24/2021.   Recent Relevant Labs: Lab Results  Component Value Date/Time   HGBA1C 5.8 08/26/2020 12:00 AM   HGBA1C 6.1 03/10/2020 12:00 AM   MICROALBUR 30 (H) 12/10/2019 03:09 PM   MICROALBUR 30 (H) 04/17/2019 01:37 PM    Kidney Function Lab Results  Component Value Date/Time   CREATININE 1.15 (H) 11/04/2020 02:06 PM   CREATININE 1.39 (H) 04/23/2020 04:05 PM   CREATININE 1.00 11/22/2013 11:26 AM   CREATININE 0.92 04/25/2013 09:58 AM   GFRNONAA 48 (L) 11/04/2020 02:06 PM   GFRNONAA 60 (L) 11/22/2013 11:26 AM   GFRAA 56 (L) 11/04/2020 02:06 PM   GFRAA >60 11/22/2013 11:26 AM    Current antihyperglycemic regimen:    What recent interventions/DTPs have been made to improve glycemic control:    Have there been any recent hospitalizations or ED visits since last visit with CPP? No  Patient  hypoglycemic symptoms, including   Patient  hyperglycemic symptoms, including   How often are you checking your blood sugar?   What are your blood sugars ranging?  Fasting:  Before meals:  After meals:  Bedtime:   During the week, how often does your blood glucose drop below 70?   Are you checking your feet daily/regularly?   Adherence Review: Is the patient currently on a STATIN medication?  Is the patient currently on ACE/ARB medication?  Does the patient  have >5 day gap between last estimated fill dates?   Star Rating Drugs: simvastatin (ZOCOR) 40 MG tablet last filled 05/11/21 90 DS  [place] Clinical Pharmacist Assistant [phone]   Unable to reach pt after 3 attempts

## 2021-07-12 ENCOUNTER — Other Ambulatory Visit: Payer: Self-pay | Admitting: Nurse Practitioner

## 2021-07-12 NOTE — Telephone Encounter (Signed)
Requested Prescriptions  Pending Prescriptions Disp Refills  . FLUoxetine (PROZAC) 40 MG capsule [Pharmacy Med Name: FLUOXETINE  40MG   CAP] 90 capsule 3    Sig: TAKE 1 CAPSULE BY MOUTH  DAILY     Psychiatry:  Antidepressants - SSRI Passed - 07/12/2021  8:31 AM      Passed - Completed PHQ-2 or PHQ-9 in the last 360 days      Passed - Valid encounter within last 6 months    Recent Outpatient Visits          1 month ago Type 2 diabetes mellitus with microalbuminuria, with long-term current use of insulin ([place])   [place], [place] T, NP   2 months ago Lab test positive for detection of COVID-19 virus   [employer], [place] T, NP   8 months ago Uncontrolled type 2 diabetes mellitus with chronic kidney disease ([place])   [place], Jolene T, NP   1 year ago Uncontrolled type 2 diabetes mellitus with chronic kidney disease ([place])   [place], [place] T, NP   1 year ago Dizziness   [place], [place] T, NP      Future Appointments            In 4 months [name], [name], NP [facility], [facility]   In 4 months  [facility]           . traZODone (DESYREL) 50 MG tablet [employer] Med Name: traZODone HCl 50 MG Oral Tablet] 90 tablet 3    Sig: TAKE 1 TABLET BY MOUTH  DAILY AT BEDTIME     Psychiatry: Antidepressants - Serotonin Modulator Passed - 07/12/2021  8:31 AM      Passed - Completed PHQ-2 or PHQ-9 in the last 360 days      Passed - Valid encounter within last 6 months    Recent Outpatient Visits          1 month ago Type 2 diabetes mellitus with microalbuminuria, with long-term current use of insulin ([place])   [place], [place] T, NP   2 months ago Lab test positive for detection of COVID-19 virus   [employer], [place] T, NP   8 months ago Uncontrolled type 2 diabetes mellitus with chronic kidney disease  ([place])   [place], Jolene T, NP   1 year ago Uncontrolled type 2 diabetes mellitus with chronic kidney disease ([place])   [place], [name], NP   1 year ago Dizziness   [place], [name], NP      Future Appointments            In 4 months [name], [name], NP [facility], [facility]   In 4 months  [facility]           . simvastatin (ZOCOR) 40 MG tablet [Pharmacy Med Name: Simvastatin 40 MG Oral Tablet] 90 tablet 3    Sig: TAKE 1 TABLET BY MOUTH  DAILY     Cardiovascular:  Antilipid - Statins Passed - 07/12/2021  8:31 AM      Passed - Total Cholesterol in normal range and within 360 days    Cholesterol, Total  Date Value Ref Range Status  05/15/2021 133 100 - 199 mg/dL Final   Cholesterol Piccolo, Waived  Date Value Ref Range Status  05/21/2015 125 <200 mg/dL Final  Comment:                            Desirable                <200                         Borderline High      200- 239                         High                     >239          Passed - LDL in normal range and within 360 days    LDL Chol Calc (NIH)  Date Value Ref Range Status  05/15/2021 44 0 - 99 mg/dL Final         Passed - HDL in normal range and within 360 days    HDL  Date Value Ref Range Status  05/15/2021 67 >39 mg/dL Final         Passed - Triglycerides in normal range and within 360 days    Triglycerides  Date Value Ref Range Status  05/15/2021 126 0 - 149 mg/dL Final   Triglycerides Piccolo,Waived  Date Value Ref Range Status  05/21/2015 225 (H) <150 mg/dL Final    Comment:                            Normal                   <150                         Borderline High     150 - 199                         High                200 - 499                         Very High                >499          Passed - Patient is not pregnant      Passed - Valid encounter within last 12  months    Recent Outpatient Visits          1 month ago Type 2 diabetes mellitus with microalbuminuria, with long-term current use of insulin ([place])   [place], [place] T, NP   2 months ago Lab test positive for detection of COVID-19 virus   [employer], [place] T, NP   8 months ago Uncontrolled type 2 diabetes mellitus with chronic kidney disease ([place])   [place], Jolene T, NP   1 year ago Uncontrolled type 2 diabetes mellitus with chronic kidney disease ([place])   [place], [name], NP   1 year ago Dizziness   [place], [name], NP      Future Appointments            In 4 months [name], [name], NP [facility]   In 4 months  [facility]           . ramipril (ALTACE) 2.5 MG capsule [Pharmacy Med Name: RAMIPRIL  2.5MG   CAP] 90 capsule     Sig: TAKE 1 CAPSULE BY MOUTH  DAILY     Cardiovascular:  ACE Inhibitors Failed - 07/12/2021  8:31 AM      Failed - Cr in normal range and within 180 days    Creatinine  Date Value Ref Range Status  11/22/2013 1.00 0.60 - 1.30 mg/dL Final   Creatinine, Ser  Date Value Ref Range Status  11/04/2020 1.15 (H) 0.57 - 1.00 mg/dL Final   Creatinine, Urine  Date Value Ref Range Status  12/11/2019 81 mg/dL Final    Comment:    Performed at [facility], [address] [place], [place] [address]         Failed - K in normal range and within 180 days    Potassium  Date Value Ref Range Status  11/04/2020 4.3 3.5 - 5.2 mmol/L Final  11/22/2013 4.6 3.5 - 5.1 mmol/L Final         Passed - Patient is not pregnant      Passed - Last BP in normal range    BP Readings from Last 1 Encounters:  05/15/21 100/61         Passed - Valid encounter within last 6 months    Recent Outpatient Visits          1 month ago Type 2 diabetes mellitus with microalbuminuria, with long-term current use  of insulin ([place])   [place], [place] T, NP   2 months ago Lab test positive for detection of COVID-19 virus   [employer], [place] T, NP   8 months ago Uncontrolled type 2 diabetes mellitus with chronic kidney disease ([place])   [place], Jolene T, NP   1 year ago Uncontrolled type 2 diabetes mellitus with chronic kidney disease ([place])   [place], [name], NP   1 year ago Dizziness   [place], [name], NP      Future Appointments            In 4 months [name], [name], NP [facility], [facility]   In 4 months  [facility]

## 2021-07-12 NOTE — Telephone Encounter (Signed)
dc'd 05/15/21 Dc'd by provider [name] NP

## 2021-07-31 ENCOUNTER — Telehealth: Payer: Self-pay

## 2021-07-31 ENCOUNTER — Ambulatory Visit (INDEPENDENT_AMBULATORY_CARE_PROVIDER_SITE_OTHER): Payer: Medicare Other | Admitting: Nurse Practitioner

## 2021-07-31 ENCOUNTER — Ambulatory Visit: Payer: Self-pay

## 2021-07-31 ENCOUNTER — Other Ambulatory Visit: Payer: Self-pay

## 2021-07-31 ENCOUNTER — Encounter: Payer: Self-pay | Admitting: Nurse Practitioner

## 2021-07-31 VITALS — BP 130/66 | HR 80 | Temp 97.6°F | Ht 66.14 in | Wt 149.4 lb

## 2021-07-31 DIAGNOSIS — S91309A Unspecified open wound, unspecified foot, initial encounter: Secondary | ICD-10-CM | POA: Diagnosis not present

## 2021-07-31 MED ORDER — MUPIROCIN CALCIUM 2 % EX CREA: 1.0000 "application " | TOPICAL_CREAM | Freq: Two times a day (BID) | CUTANEOUS | 0 refills | Status: DC

## 2021-07-31 NOTE — Telephone Encounter (Signed)
Pharmacy asking if Bactroban can be prescribed as an ointment rather than cream. Called Dr. Neomia Dear and LM on VM with the above information. Pharmacy number provided.

## 2021-07-31 NOTE — Patient Instructions (Signed)
Wash wound with soap and water twice a day Apply a small amount of mupiricin ointment twice a day Cover area if on feet or going out of the house You can leave it open to air at bedtime Follow up in 1 week for wound check

## 2021-07-31 NOTE — Telephone Encounter (Signed)
Pt. Reports she "stepped wrong last week" and injured left ball of her foot. Looked bruised, but now is red, raw, has a bad odor. Aappointment made for today.    Reason for Disposition  Diabetes mellitus (Exception: small cut or scrape)  Answer Assessment - Initial Assessment Questions 1. MECHANISM: "How did the injury happen?" (e.g., twisting injury, direct blow)      Stepped on something 2. ONSET: "When did the injury happen?" (Minutes or hours ago)      Last week 3. LOCATION: "Where is the injury located?"      Ball of foot 4. APPEARANCE of INJURY: "What does the injury look like?"      Red 5. WEIGHT-BEARING: "Can you put weight on that foot?" "Can you walk (four steps or more)?"       Yes 6. SIZE: For cuts, bruises, or swelling, ask: "How large is it?" (e.g., inches or centimeters;  entire joint)      1/2 inch 7. PAIN: "Is there pain?" If Yes, ask: "How bad is the pain?"    (e.g., Scale 1-10; or mild, moderate, severe)   - NONE (0): no pain.   - MILD (1-3): doesn't interfere with normal activities.    - MODERATE (4-7): interferes with normal activities (e.g., work or school) or awakens from sleep, limping.    - SEVERE (8-10): excruciating pain, unable to do any normal activities, unable to walk.      No 8. TETANUS: For any breaks in the skin, ask: "When was the last tetanus booster?"     Unsure 9. OTHER SYMPTOMS: "Do you have any other symptoms?"      Has bad odor 10. PREGNANCY: "Is there any chance you are pregnant?" "When was your last menstrual period?"       No  Protocols used: Ankle and Foot Injury-A-AH

## 2021-07-31 NOTE — Assessment & Plan Note (Signed)
Callous with small scabbed area noted to bottom of left foot with mild erythema. Does not appear infected at this time. Continue washing foot with soap and water daily. Apply pea sized amount of mupirocin to area twice a day. Follow up in 1 week or sooner with concerns.

## 2021-07-31 NOTE — Progress Notes (Signed)
Acute Office Visit  Subjective:    Patient ID: Jessica Montoya, female    DOB: [birth year], 70 y.o.   MRN: [id number]  Chief Complaint  Patient presents with   Wound    Patient states that she has a wound on the bottom of her left foot, started out with blisters and oozy around last weekend. Also states that yesterday the wound was red.     HPI Patient is in today for wound to the bottom of her left foot that started last weekend. She is diabetic and wants to get the wound evaluated.   SKIN INFECTION  Duration: days Location: bottom of left foot History of trauma in area: no Pain: no - has neuropathy in feet Quality: n/a Severity:  0/10 Redness: yes Swelling: no Oozing: yes Pus: no Fevers: no Nausea/vomiting: no Status: worse Treatments attempted: peroxide. Antibiotic ointment and bandaid at night  Tetanus: UTD   Past Medical History:  Diagnosis Date   Anxiety    Arthritis    knees   Chronic pain    CKD (chronic kidney disease), stage II    Depression    Diabetes mellitus without complication ([place])    Diabetic neuropathy ([place])    Fatigue    a. 05/2018 Echo: EF 60-65%, no rwma, nl RV fxn; b. 05/2018 Lexiscan MV: EF>65%. No ischemia/infarct.   Hyperlipidemia    Hypertension    Insomnia    Long term current use of insulin (HCC)    Peripheral neuropathy    Vertigo    2x/month   Wears dentures    full upper and lower    Past Surgical History:  Procedure Laterality Date   ABDOMINAL HYSTERECTOMY     CATARACT EXTRACTION W/PHACO Left 02/20/2019   Procedure: CATARACT EXTRACTION PHACO AND INTRAOCULAR LENS PLACEMENT (IOC) LEFT DIABETES;  Surgeon: Birder Robson, MD;  Location: [facility];  Service: Ophthalmology;  Laterality: Left;   CATARACT EXTRACTION W/PHACO Right 03/27/2019   Procedure: CATARACT EXTRACTION PHACO AND INTRAOCULAR LENS PLACEMENT ([place])  RIGHT DIABETIC;  Surgeon: Birder Robson, MD;  Location: [facility];  Service:  Ophthalmology;  Laterality: Right;  Diabetic - insulin   COLON SURGERY     COLONOSCOPY WITH PROPOFOL N/A 02/05/2020   Procedure: COLONOSCOPY WITH PROPOFOL;  Surgeon: Virgel Manifold, MD;  Location: [facility];  Service: Endoscopy;  Laterality: N/A;   ESOPHAGOGASTRODUODENOSCOPY (EGD) WITH PROPOFOL N/A 02/05/2020   Procedure: ESOPHAGOGASTRODUODENOSCOPY (EGD) WITH PROPOFOL;  Surgeon: Virgel Manifold, MD;  Location: [facility];  Service: Endoscopy;  Laterality: N/A;    Family History  Problem Relation Age of Onset   Cancer Mother        melanoma   Breast cancer Mother 57   Cancer Father        pancreatic   Hypertension Maternal Grandmother    Cancer Maternal Grandmother        colon   Cancer Paternal Grandmother        stomach   Stroke Paternal Grandfather    Cancer Maternal Grandfather        kidney    Social History   Socioeconomic History   Marital status: Married    Spouse name: Not on file   Number of children: Not on file   Years of education: Not on file   Highest education level: High school graduate  Occupational History   Occupation: retired  Tobacco Use   Smoking status: Never   Smokeless tobacco: Never  Media planner  Vaping Use: Never used  Substance and Sexual Activity   Alcohol use: No    Alcohol/week: 0.0 standard drinks   Drug use: No   Sexual activity: Yes  Other Topics Concern   Not on file  Social History Narrative   Not on file   Social Determinants of Health   Financial Resource Strain: Low Risk    Difficulty of Paying Living Expenses: Not hard at all  Food Insecurity: No Food Insecurity   Worried About Charity fundraiser in the Last Year: Never true   [place] in the Last Year: Never true  Transportation Needs: No Transportation Needs   Lack of Transportation (Medical): No   Lack of Transportation (Non-Medical): No  Physical Activity: Insufficiently Active   Days of Exercise per Week: 3 days   Minutes of Exercise per  Session: 30 min  Stress: No Stress Concern Present   Feeling of Stress : Not at all  Social Connections: Not on file  Intimate Partner Violence: Not on file    Outpatient Medications Prior to Visit  Medication Sig Dispense Refill   aspirin 81 MG tablet Take 81 mg by mouth daily.     Cholecalciferol (VITAMIN D PO) Take 2,000 Units by mouth daily.      Dulaglutide (TRULICITY ) Inject 1.5 mg into the skin once a week.     ferrous fumarate (HEMOCYTE - 106 MG FE) 325 (106 FE) MG TABS tablet Take 1 tablet by mouth daily.     FLUoxetine (PROZAC) 40 MG capsule TAKE 1 CAPSULE BY MOUTH  DAILY 90 capsule 1   insulin regular human CONCENTRATED (HUMULIN R) 500 UNIT/ML kwikpen Inject 40 units at 8 AM. Inject 40 units at 2 PM. Inject 20 units at 8 PM.     Multiple Vitamin (MULTIVITAMIN) tablet Take 1 tablet by mouth daily.     omeprazole (PRILOSEC) 20 MG capsule TAKE 1 CAPSULE BY MOUTH EVERY DAY 90 capsule 3   simvastatin (ZOCOR) 40 MG tablet TAKE 1 TABLET BY MOUTH  DAILY 90 tablet 1   traZODone (DESYREL) 50 MG tablet TAKE 1 TABLET BY MOUTH  DAILY AT BEDTIME 90 tablet 1   vitamin B-12 (CYANOCOBALAMIN) 1000 MCG tablet Take 2,000 mcg by mouth daily.     No facility-administered medications prior to visit.    Allergies  Allergen Reactions   Compazine [Prochlorperazine Edisylate] Rash   Exenatide Nausea Only   Insulin Aspart Nausea Only    Review of Systems  Constitutional: Negative.   Respiratory: Negative.    Cardiovascular: Negative.   Genitourinary: Negative.   Skin:  Positive for wound.  Neurological: Negative.       Objective:    Physical Exam Vitals and nursing note reviewed.  Constitutional:      General: She is not in acute distress.    Appearance: Normal appearance.  HENT:     Head: Normocephalic.  Eyes:     Conjunctiva/sclera: Conjunctivae normal.  Cardiovascular:     Rate and Rhythm: Normal rate.     Pulses: Normal pulses.  Pulmonary:     Effort: Pulmonary effort is  normal.  Musculoskeletal:     Cervical back: Normal range of motion.  Skin:    General: Skin is warm.     Comments: Small 1cm x 1 cm callous to bottom of left lateral side of foot with small scabbed area in the middle. Mild erythema surrounding area  Neurological:     General: No focal deficit present.  Mental Status: She is alert and oriented to person, place, and time.  Psychiatric:        Mood and Affect: Mood normal.        Behavior: Behavior normal.        Thought Content: Thought content normal.        Judgment: Judgment normal.    BP 130/66   Pulse 80   Temp 97.6 F (36.4 C) (Oral)   Ht 5' 6.14" (1.68 m)   Wt 149 lb 6.4 oz (67.8 kg)   LMP  (LMP Unknown)   SpO2 100%   BMI 24.01 kg/m  Wt Readings from Last 3 Encounters:  07/31/21 149 lb 6.4 oz (67.8 kg)  05/15/21 153 lb 9.6 oz (69.7 kg)  12/01/20 148 lb (67.1 kg)    Health Maintenance Due  Topic Date Due   URINE MICROALBUMIN  12/09/2020   OPHTHALMOLOGY EXAM  02/24/2021    There are no preventive care reminders to display for this patient.   Lab Results  Component Value Date   TSH 1.080 11/04/2020   Lab Results  Component Value Date   WBC 6.9 02/20/2020   HGB 12.3 02/20/2020   HCT 37.0 02/20/2020   MCV 90 02/20/2020   PLT 212 02/20/2020   Lab Results  Component Value Date   NA 144 11/04/2020   K 4.3 11/04/2020   CO2 27 11/04/2020   GLUCOSE 155 (H) 11/04/2020   BUN 13 11/04/2020   CREATININE 1.15 (H) 11/04/2020   BILITOT 0.6 11/04/2020   ALKPHOS 74 11/04/2020   AST 20 11/04/2020   ALT 17 11/04/2020   PROT 6.7 11/04/2020   ALBUMIN 4.2 11/04/2020   CALCIUM 9.6 11/04/2020   ANIONGAP 15 12/11/2019   Lab Results  Component Value Date   CHOL 133 05/15/2021   Lab Results  Component Value Date   HDL 67 05/15/2021   Lab Results  Component Value Date   LDLCALC 44 05/15/2021   Lab Results  Component Value Date   TRIG 126 05/15/2021   No results found for: CHOLHDL Lab Results   Component Value Date   HGBA1C 5.8 08/26/2020       Assessment & Plan:   Problem List Items Addressed This Visit       Other   Wound of foot - Primary    Callous with small scabbed area noted to bottom of left foot with mild erythema. Does not appear infected at this time. Continue washing foot with soap and water daily. Apply pea sized amount of mupirocin to area twice a day. Follow up in 1 week or sooner with concerns.         Meds ordered this encounter  Medications   mupirocin cream (BACTROBAN) 2 %    Sig: Apply 1 application topically 2 (two) times daily.    Dispense:  15 g    Refill:  0     Charyl Dancer, NP

## 2021-08-10 ENCOUNTER — Telehealth: Payer: Self-pay | Admitting: Nurse Practitioner

## 2021-08-10 ENCOUNTER — Ambulatory Visit: Payer: Medicare Other | Admitting: Nurse Practitioner

## 2021-08-10 NOTE — Telephone Encounter (Signed)
Copied from [place] [phone]. Topic: General - Other >> Aug 10, 2021 11:05 AM Antonieta Iba C wrote: Reason for CRM: pt is calling in to request a letter from her provider to be excused from jury duty. Pt says that she would like to pick this up as soon as possible to turn in to court.   Juror number is [id number]   [phone]

## 2021-08-10 NOTE — Telephone Encounter (Signed)
Will patient need an appointment to obtain an excuse from jury duty?

## 2021-08-10 NOTE — Telephone Encounter (Signed)
She is expected to show for jury duty selection on August 18, 2021.

## 2021-08-11 NOTE — Telephone Encounter (Signed)
Pt states she is supposed to have the letter turned in by 5 pm today. Pt states she received the letter late, so that is rush. Please call when ready for pick up.

## 2021-10-22 ENCOUNTER — Telehealth: Payer: Self-pay

## 2021-10-22 NOTE — Chronic Care Management (AMB) (Signed)
° ° °  Chronic Care Management Pharmacy Assistant   Name: Jessica Montoya  MRN: [id number] DOB: [birth year]  Reason for Encounter: Disease State Diabetes Mellitus  Recent office visits:  07/31/21-Lauren Avelina Laine, NP. Seen for wound. Start on Mupirocin twice daily. Follow up in 1 week. 05/15/21-Jolene T. Ned Card, NP (PCP) General follow up visit.  stop Ramipril. Start wearing compression hose on during day and off at night. Labs ordered. Mammogram ordered. Follow up in 6 months. 04/27/21-Jolene T. Deri Fuelling, NP (PCP) Seen for COVID positive. Start on [employer]. Follow up inn 10 days.  Recent consult visits:  None noted  Hospital visits:  None in previous 6 months  Medications: Outpatient Encounter Medications as of 10/22/2021  Medication Sig   aspirin 81 MG tablet Take 81 mg by mouth daily.   Cholecalciferol (VITAMIN D PO) Take 2,000 Units by mouth daily.    Dulaglutide (TRULICITY [place]) Inject 1.5 mg into the skin once a week.   ferrous fumarate (HEMOCYTE - 106 MG FE) 325 (106 FE) MG TABS tablet Take 1 tablet by mouth daily.   FLUoxetine (PROZAC) 40 MG capsule TAKE 1 CAPSULE BY MOUTH  DAILY   insulin regular human CONCENTRATED (HUMULIN R) 500 UNIT/ML kwikpen Inject 40 units at 8 AM. Inject 40 units at 2 PM. Inject 20 units at 8 PM.   Multiple Vitamin (MULTIVITAMIN) tablet Take 1 tablet by mouth daily.   mupirocin cream (BACTROBAN) 2 % Apply 1 application topically 2 (two) times daily.   omeprazole (PRILOSEC) 20 MG capsule TAKE 1 CAPSULE BY MOUTH EVERY DAY   simvastatin (ZOCOR) 40 MG tablet TAKE 1 TABLET BY MOUTH  DAILY   traZODone (DESYREL) 50 MG tablet TAKE 1 TABLET BY MOUTH  DAILY AT BEDTIME   vitamin B-12 (CYANOCOBALAMIN) 1000 MCG tablet Take 2,000 mcg by mouth daily.   No facility-administered encounter medications on file as of 10/22/2021.   Current antihyperglycemic regimen:  Humulin 500 unit inject 40 units in AM INJECT 40 2PM and inject 20 8PM  Unsuccessful attempt to  completed assessment call. I have left 3 voicemail's for the patient to return phone call.   Adherence Review: Is the patient currently on a STATIN medication? Yes Is the patient currently on ACE/ARB medication? No Does the patient have >5 day gap between last estimated fill dates? Yes   Care Gaps: [id number] Vaccine:Never done URINE MICROALBUMIN:Last completed: Dec 10, 2019 OPHTHALMOLOGY EXAM:Last completed: Feb 25, 2020 HEMOGLOBIN A1C:Last completed: Feb 09, 2021 FOOT EXAM:Last completed: Sep 05, 2020  Star Rating Drugs: Simvastatin 40 mg Last QZESPQ:33/00/76 90 DS Trulicity 1.5 mg Last AUQJFH:54/56/25 28 DS  [name], [place]

## 2021-10-31 NOTE — Patient Instructions (Signed)

## 2021-11-04 ENCOUNTER — Encounter: Payer: Self-pay | Admitting: Nurse Practitioner

## 2021-11-04 ENCOUNTER — Ambulatory Visit (INDEPENDENT_AMBULATORY_CARE_PROVIDER_SITE_OTHER): Payer: Medicare Other | Admitting: Nurse Practitioner

## 2021-11-04 ENCOUNTER — Other Ambulatory Visit: Payer: Self-pay

## 2021-11-04 VITALS — BP 118/74 | HR 73 | Temp 97.9°F | Ht 66.14 in | Wt 148.8 lb

## 2021-11-04 DIAGNOSIS — E1169 Type 2 diabetes mellitus with other specified complication: Secondary | ICD-10-CM

## 2021-11-04 DIAGNOSIS — M85851 Other specified disorders of bone density and structure, right thigh: Secondary | ICD-10-CM | POA: Diagnosis not present

## 2021-11-04 DIAGNOSIS — E785 Hyperlipidemia, unspecified: Secondary | ICD-10-CM

## 2021-11-04 DIAGNOSIS — E1129 Type 2 diabetes mellitus with other diabetic kidney complication: Secondary | ICD-10-CM | POA: Diagnosis not present

## 2021-11-04 DIAGNOSIS — N1832 Chronic kidney disease, stage 3b: Secondary | ICD-10-CM | POA: Diagnosis not present

## 2021-11-04 DIAGNOSIS — E1142 Type 2 diabetes mellitus with diabetic polyneuropathy: Secondary | ICD-10-CM

## 2021-11-04 DIAGNOSIS — E1159 Type 2 diabetes mellitus with other circulatory complications: Secondary | ICD-10-CM

## 2021-11-04 DIAGNOSIS — E538 Deficiency of other specified B group vitamins: Secondary | ICD-10-CM

## 2021-11-04 DIAGNOSIS — E1122 Type 2 diabetes mellitus with diabetic chronic kidney disease: Secondary | ICD-10-CM

## 2021-11-04 DIAGNOSIS — F5101 Primary insomnia: Secondary | ICD-10-CM | POA: Diagnosis not present

## 2021-11-04 DIAGNOSIS — Z794 Long term (current) use of insulin: Secondary | ICD-10-CM | POA: Diagnosis not present

## 2021-11-04 DIAGNOSIS — N183 Chronic kidney disease, stage 3 unspecified: Secondary | ICD-10-CM

## 2021-11-04 DIAGNOSIS — K219 Gastro-esophageal reflux disease without esophagitis: Secondary | ICD-10-CM

## 2021-11-04 DIAGNOSIS — R809 Proteinuria, unspecified: Secondary | ICD-10-CM | POA: Diagnosis not present

## 2021-11-04 DIAGNOSIS — N1831 Chronic kidney disease, stage 3a: Secondary | ICD-10-CM | POA: Diagnosis not present

## 2021-11-04 DIAGNOSIS — I152 Hypertension secondary to endocrine disorders: Secondary | ICD-10-CM | POA: Diagnosis not present

## 2021-11-04 DIAGNOSIS — E559 Vitamin D deficiency, unspecified: Secondary | ICD-10-CM | POA: Diagnosis not present

## 2021-11-04 DIAGNOSIS — F331 Major depressive disorder, recurrent, moderate: Secondary | ICD-10-CM

## 2021-11-04 LAB — BAYER DCA HB A1C WAIVED: HB A1C (BAYER DCA - WAIVED): 6.2 % — ABNORMAL HIGH (ref 4.8–5.6)

## 2021-11-04 LAB — MICROALBUMIN, URINE WAIVED
Creatinine, Urine Waived: 200 mg/dL (ref 10–300)
Microalb, Ur Waived: 30 mg/L — ABNORMAL HIGH (ref 0–19)
Microalb/Creat Ratio: <30 mg/g (ref <30)

## 2021-11-04 MED ORDER — BELSOMRA 5 MG PO TABS: 5.0000 mg | ORAL_TABLET | Freq: Every evening | ORAL | 3 refills | Status: DC | PRN

## 2021-11-04 MED ORDER — SIMVASTATIN 40 MG PO TABS: 40.0000 mg | ORAL_TABLET | Freq: Every day | ORAL | 4 refills | Status: DC

## 2021-11-04 MED ORDER — OMEPRAZOLE 20 MG PO CPDR: DELAYED_RELEASE_CAPSULE | ORAL | 4 refills | Status: DC

## 2021-11-04 MED ORDER — OMEPRAZOLE 20 MG PO CPDR: 20.0000 mg | DELAYED_RELEASE_CAPSULE | Freq: Two times a day (BID) | ORAL | 4 refills | Status: DC

## 2021-11-04 MED ORDER — FLUOXETINE HCL 40 MG PO CAPS: ORAL_CAPSULE | ORAL | 4 refills | Status: DC

## 2021-11-04 NOTE — Assessment & Plan Note (Signed)
Chronic, ongoing.  Last DEXA 02/2021 at [facility] with ongoing osteopenia, mild decrease in T-score.  Continue Vitamin D supplement and recheck Vit D level today.  Collaborate with endo on changes in future if needed.

## 2021-11-04 NOTE — Assessment & Plan Note (Signed)
Chronic, ongoing, followed by endo.  A1C today 6.2%, has not had checked since May 2022, praised for success. Continue current regimen at this time, defer changes to endo, and collaboration with endo + CCM collaboration.  Maintain off Gabapentin at this time due to improved fatigue and dizziness without it.

## 2021-11-04 NOTE — Assessment & Plan Note (Signed)
Refer to diabetes with CKD plan of care.

## 2021-11-04 NOTE — Assessment & Plan Note (Signed)
Chronic, ongoing.  Continue current medication regimen and adjust as needed.  Obtain lipid panel today. 

## 2021-11-04 NOTE — Assessment & Plan Note (Signed)
Chronic, ongoing with poor control -- continues to have ongoing fatigue issues and poor sleep.  Will order a sleep study to further assess, discussed with patient.  Discontinue Trazodone as offering no benefit, even with Melatonin.  Discussed with patient, would avoid Ambien due to BEERS criteria -- will trial Belsomra 5 MG nightly, educated her on this medication including side effects and use.  Return in 8 weeks.

## 2021-11-04 NOTE — Assessment & Plan Note (Signed)
Chronic, stable without medication on board.  Discontinued Altace in May 2022, would benefit for kidney protection, but at this time must take risk of orthostatic BPs and falls into consideration.  Recommend she continue to monitor BP at home daily and document + focus on DASH diet.  CMP, TSH, lipid, urine ALB today.

## 2021-11-04 NOTE — Assessment & Plan Note (Signed)
Chronic, ongoing.  Having some increased symptoms in evening.  Will trial increasing Omeprazole to 20 MG BID which may benefit evening hours.  Recommend heavy focus on diet changes.  Consider reduction in future to original 20 MG daily.

## 2021-11-04 NOTE — Assessment & Plan Note (Signed)
Chronic, ongoing.  Continue collaboration with endocrinology.

## 2021-11-04 NOTE — Assessment & Plan Note (Signed)
Chronic, ongoing. Will continue Prozac 40 MG daily and adjust regimen as needed.  Denies SI/HI.  Adjust medication as needed and monitor NA+ level.

## 2021-11-04 NOTE — Progress Notes (Signed)
BP 118/74    Pulse 73    Temp 97.9 F (36.6 C) (Oral)    Ht 5' 6.14" (1.68 m)    Wt 148 lb 12.8 oz (67.5 kg)    LMP  (LMP Unknown)    SpO2 100%    BMI 23.92 kg/m    Subjective:    Patient ID: Jessica Montoya, female    DOB: [birth year], 71 y.o.   MRN: [id number]  HPI: Jessica Montoya is a 71 y.o. female  Chief Complaint  Patient presents with   Diabetes    Patient states she has an upcoming appointment coming in March.    Hyperlipidemia   Hypertension   Mood   Sleeping Problem    Patient state she is still having issues with sleeping. Patient states the medication helps here and there. Patient states she has added Melatonin along with her Trazodone prescription.    DIABETES Is followed by Dr. [name], last saw on 02/09/21 -- A1c was 5.9%. Taking U500 takes units based on what blood sugar is -- often takes twice a day -- anywhere from 20-30 units.   Continues on Trulicity 1.5 MG weekly.    Has tried Metformin (GI upset).  Is being followed by CCM team for assistance with insulin.  Sees Dr. [name] on 7th for labs and 14th for visit.  Continues on B12 daily for past low levels. Hypoglycemic episodes: occasional at night -- 51 is lowest Polydipsia/polyuria: no Visual disturbance: no Chest pain: no Paresthesias: no Glucose Monitoring: yes             Accucheck frequency: TID with Libre             Fasting glucose: usually 160 or a little higher             Post prandial:              Evening: 140-150 often             Before meals:  Taking Insulin?: yes             Long acting insulin: as noted above             Short acting insulin: Blood Pressure Monitoring: rarely Retinal Examination: Not Up to Date -- [facility] Exam: Up to Date Pneumovax: Up to Date Influenza: Refused Aspirin: yes    HYPERTENSION / HYPERLIPIDEMIA Continues Simvastatin.  Saw cardiologist Dr. Fletcher Anon, last 04/03/20 -- does not have follow-up appointment.   Satisfied with current treatment?  yes Duration of hypertension: chronic BP monitoring frequency: rarely BP range: 110/70 range at home BP medication side effects: no Duration of hyperlipidemia: chronic Cholesterol medication side effects: no Cholesterol supplements: none Medication compliance: good compliance Aspirin: no Recent stressors: no Recurrent headaches: no Visual changes: no Palpitations: no Dyspnea: no Chest pain: no Lower extremity edema: no Dizzy/lightheaded: every day at least once  GERD Taking Omeprazole 20 MG daily, but having symptoms come afternoon. GERD control status: uncontrolled Satisfied with current treatment? yes Heartburn frequency: in afternoon often Medication side effects: no  Medication compliance: fluctuating Previous GERD medications: TUMS Dysphagia: no Odynophagia:  no Hematemesis: no Blood in stool: no EGD: yes   OSTEOPENIA Had DEXA on 02/02/21.  Continues on Vitamin D supplement daily. Satisfied with current treatment?: yes Past osteoporosis medications/treatments: none Adequate calcium & vitamin D: yes Intolerance to bisphosphonates: none Weight bearing exercises: yes   DEPRESSION Continues on Trazodone 50 MG + Melatonin and Prozac 40  MG.  We tried increasing Trazodone to 100 MG in past, but this made her too tired.    Does endorse ongoing fatigue post Covid. Has second bout of Covid on 04/27/21 -- treated with Molnupiravir.  When wakes up in morning feels tired and does have occasional headaches in afternoon. Fatigue lasts all day.   Mood status: stable Satisfied with current treatment?: yes Symptom severity: mild  Duration of current treatment : chronic Side effects: no Medication compliance: good compliance Psychotherapy/counseling: none Depressed mood: occasional Anxious mood: no Anhedonia: no Significant weight loss or gain: no Insomnia: yes hard to fall asleep Fatigue: no Feelings of worthlessness or guilt: no Impaired concentration/indecisiveness:  no Suicidal ideations: no Hopelessness: no Crying spells: no Depression screen [facility] 2/9 11/04/2021 07/31/2021 05/15/2021 12/01/2020 11/04/2020  Decreased Interest 0 0 2 0 0  Down, Depressed, Hopeless 0 0 1 0 0  PHQ - 2 Score 0 0 3 0 0  Altered sleeping 2 0 3 3 0  Tired, decreased energy 3 0 3 3 0  Change in appetite 1 0 2 0 0  Feeling bad or failure about yourself  0 0 3 0 0  Trouble concentrating 0 0 2 0 0  Moving slowly or fidgety/restless 0 0 2 0 0  Suicidal thoughts 0 0 1 0 0  PHQ-9 Score 6 0 19 6 0  Difficult doing work/chores - Not difficult at all Somewhat difficult - -  Some recent data might be hidden    Relevant past medical, surgical, family and social history reviewed and updated as indicated. Interim medical history since our last visit reviewed. Allergies and medications reviewed and updated.  Review of Systems  Constitutional:  Negative for activity change, appetite change, diaphoresis, fatigue and fever.  Respiratory:  Negative for cough, chest tightness and shortness of breath.   Cardiovascular:  Negative for chest pain, palpitations and leg swelling.  Gastrointestinal: Negative.   Endocrine: Negative for cold intolerance, heat intolerance, polydipsia, polyphagia and polyuria.  Neurological: Negative.   Psychiatric/Behavioral:  Negative for decreased concentration, self-injury, sleep disturbance and suicidal ideas. The patient is not nervous/anxious.    Per HPI unless specifically indicated above    Objective:    BP 118/74    Pulse 73    Temp 97.9 F (36.6 C) (Oral)    Ht 5' 6.14" (1.68 m)    Wt 148 lb 12.8 oz (67.5 kg)    LMP  (LMP Unknown)    SpO2 100%    BMI 23.92 kg/m   Wt Readings from Last 3 Encounters:  11/04/21 148 lb 12.8 oz (67.5 kg)  07/31/21 149 lb 6.4 oz (67.8 kg)  05/15/21 153 lb 9.6 oz (69.7 kg)    Physical Exam Vitals and nursing note reviewed.  Constitutional:      General: She is awake. She is not in acute distress.    Appearance: She is  well-developed and well-groomed. She is not ill-appearing.  HENT:     Head: Normocephalic.     Right Ear: Hearing, tympanic membrane, ear canal and external ear normal.     Left Ear: Hearing, tympanic membrane, ear canal and external ear normal.  Eyes:     General: Lids are normal.        Right eye: No discharge.        Left eye: No discharge.     Conjunctiva/sclera: Conjunctivae normal.     Pupils: Pupils are equal, round, and reactive to light.  Neck:  Thyroid: No thyromegaly.     Vascular: No carotid bruit.  Cardiovascular:     Rate and Rhythm: Normal rate and regular rhythm.     Heart sounds: Normal heart sounds. No murmur heard.   No gallop.  Pulmonary:     Effort: Pulmonary effort is normal. No accessory muscle usage or respiratory distress.     Breath sounds: Normal breath sounds.  Abdominal:     General: Bowel sounds are normal.     Palpations: Abdomen is soft.     Tenderness: There is no abdominal tenderness.  Musculoskeletal:     Cervical back: Normal range of motion and neck supple.     Right lower leg: No edema.     Left lower leg: No edema.  Skin:    General: Skin is warm and dry.  Neurological:     Mental Status: She is alert and oriented to person, place, and time.     Coordination: Coordination is intact.     Gait: Gait is intact.     Deep Tendon Reflexes: Reflexes are normal and symmetric.     Reflex Scores:      Brachioradialis reflexes are 2+ on the right side and 2+ on the left side.      Patellar reflexes are 2+ on the right side and 2+ on the left side.    Comments: Uses cane to assist gait.  Psychiatric:        Attention and Perception: Attention normal.        Mood and Affect: Mood normal.        Speech: Speech normal.        Behavior: Behavior normal. Behavior is cooperative.   Results for orders placed or performed in visit on 11/04/21  Bayer DCA Hb A1c Waived  Result Value Ref Range   HB A1C (BAYER DCA - WAIVED) 6.2 (H) 4.8 - 5.6 %   Microalbumin, Urine Waived  Result Value Ref Range   Microalb, Ur Waived 30 (H) 0 - 19 mg/L   Creatinine, Urine Waived 200 10 - 300 mg/dL   Microalb/Creat Ratio <30 <30 mg/g      Assessment & Plan:   Problem List Items Addressed This Visit       Cardiovascular and Mediastinum   Hypertension associated with diabetes ([place])    Chronic, stable without medication on board.  Discontinued Altace in May 2022, would benefit for kidney protection, but at this time must take risk of orthostatic BPs and falls into consideration.  Recommend she continue to monitor BP at home daily and document + focus on DASH diet.  CMP, TSH, lipid, urine ALB today.       Relevant Medications   simvastatin (ZOCOR) 40 MG tablet   Other Relevant Orders   Bayer DCA Hb A1c Waived (Completed)   Microalbumin, Urine Waived (Completed)   Comprehensive metabolic panel   TSH     Digestive   GERD (gastroesophageal reflux disease)    Chronic, ongoing.  Having some increased symptoms in evening.  Will trial increasing Omeprazole to 20 MG BID which may benefit evening hours.  Recommend heavy focus on diet changes.  Consider reduction in future to original 20 MG daily.      Relevant Medications   omeprazole (PRILOSEC) 20 MG capsule   Other Relevant Orders   Magnesium     Endocrine   CKD stage 3 due to type 2 diabetes mellitus (HCC)    Chronic, ongoing, followed by endo.  A1C 6.2%,  last check was in May 2022 at 5.9%, praised for ongoing success. Continue current regimen at this time, although recommend she cut back on insulin dose as may be getting hypoglycemia -- could be cause of dizziness, and continue collaboration with endo + CCM collaboration.   Urine alb 30 today (January 2023).  CMP today.        Relevant Medications   simvastatin (ZOCOR) 40 MG tablet   Diabetic neuropathy (HCC)    Chronic, ongoing, followed by endo.  A1C today 6.2%, has not had checked since May 2022, praised for success. Continue current  regimen at this time, defer changes to endo, and collaboration with endo + CCM collaboration.  Maintain off Gabapentin at this time due to improved fatigue and dizziness without it.        Relevant Medications   simvastatin (ZOCOR) 40 MG tablet   Other Relevant Orders   Bayer DCA Hb A1c Waived (Completed)   Microalbumin, Urine Waived (Completed)   Hyperlipidemia associated with type 2 diabetes mellitus (HCC)    Chronic, ongoing.  Continue current medication regimen and adjust as needed.  Obtain lipid panel today.        Relevant Medications   simvastatin (ZOCOR) 40 MG tablet   Other Relevant Orders   Bayer DCA Hb A1c Waived (Completed)   Comprehensive metabolic panel   Lipid Panel w/o Chol/HDL Ratio   Type 2 diabetes mellitus with microalbuminuria (HCC) - Primary    Chronic, ongoing, followed by endo.  A1C 6.2%, last check was in May 2022 at 5.9%, praised for ongoing success. Continue current regimen at this time, although recommend she cut back on insulin dose as may be getting hypoglycemia -- could be cause of dizziness, and continue collaboration with endo + CCM collaboration.   Urine alb 30 today (January 2023).  CMP today.        Relevant Medications   simvastatin (ZOCOR) 40 MG tablet   Other Relevant Orders   Bayer DCA Hb A1c Waived (Completed)   Microalbumin, Urine Waived (Completed)     Musculoskeletal and Integument   Osteopenia    Chronic, ongoing.  Last DEXA 02/2021 at [facility] with ongoing osteopenia, mild decrease in T-score.  Continue Vitamin D supplement and recheck Vit D level today.  Collaborate with endo on changes in future if needed.      Relevant Orders   VITAMIN D 25 Hydroxy (Vit-D Deficiency, Fractures)     Genitourinary   Stage 3a chronic kidney disease (CKD) ([place])    Refer to diabetes with CKD plan of care.      Relevant Orders   Microalbumin, Urine Waived (Completed)   Comprehensive metabolic panel     Other   Depression    Chronic, ongoing. Will  continue Prozac 40 MG daily and adjust regimen as needed.  Denies SI/HI.  Adjust medication as needed and monitor NA+ level.      Relevant Medications   FLUoxetine (PROZAC) 40 MG capsule   Insomnia    Chronic, ongoing with poor control -- continues to have ongoing fatigue issues and poor sleep.  Will order a sleep study to further assess, discussed with patient.  Discontinue Trazodone as offering no benefit, even with Melatonin.  Discussed with patient, would avoid Ambien due to BEERS criteria -- will trial Belsomra 5 MG nightly, educated her on this medication including side effects and use.  Return in 8 weeks.      Relevant Orders   Ambulatory referral to Sleep Studies  Long-term insulin use (HCC)    Chronic, ongoing.  Continue collaboration with endocrinology.      Relevant Orders   Bayer DCA Hb A1c Waived (Completed)   Vitamin B12 deficiency    Ongoing, continue daily supplement and recheck B12 level today.      Relevant Orders   Vitamin B12   Vitamin D deficiency    Ongoing, continue daily supplement and recheck Vit D level today.      Relevant Orders   VITAMIN D 25 Hydroxy (Vit-D Deficiency, Fractures)     Follow up plan: Return in about 8 weeks (around 12/30/2021) for INSOMNIA AND FATIGUE.

## 2021-11-04 NOTE — Assessment & Plan Note (Signed)
Ongoing, continue daily supplement and recheck Vit D level today.

## 2021-11-04 NOTE — Assessment & Plan Note (Signed)
Chronic, ongoing, followed by endo.  A1C 6.2%, last check was in May 2022 at 5.9%, praised for ongoing success. Continue current regimen at this time, although recommend she cut back on insulin dose as may be getting hypoglycemia -- could be cause of dizziness, and continue collaboration with endo + CCM collaboration.   Urine alb 30 today (January 2023).  CMP today.

## 2021-11-04 NOTE — Assessment & Plan Note (Signed)
Ongoing, continue daily supplement and recheck B12 level today.

## 2021-11-05 LAB — LIPID PANEL W/O CHOL/HDL RATIO
Cholesterol, Total: 140 mg/dL (ref 100–199)
HDL: 68 mg/dL (ref >39)
LDL Chol Calc (NIH): 48 mg/dL (ref 0–99)
Triglycerides: 142 mg/dL (ref 0–149)
VLDL Cholesterol Cal: 24 mg/dL (ref 5–40)

## 2021-11-05 LAB — COMPREHENSIVE METABOLIC PANEL
ALT: 16 IU/L (ref 0–32)
AST: 21 IU/L (ref 0–40)
Albumin/Globulin Ratio: 2 (ref 1.2–2.2)
Albumin: 4.6 g/dL (ref 3.7–4.7)
Alkaline Phosphatase: 62 IU/L (ref 44–121)
BUN/Creatinine Ratio: 14 (ref 12–28)
BUN: 20 mg/dL (ref 8–27)
Bilirubin Total: 0.5 mg/dL (ref 0.0–1.2)
CO2: 25 mmol/L (ref 20–29)
Calcium: 9.7 mg/dL (ref 8.7–10.3)
Chloride: 102 mmol/L (ref 96–106)
Creatinine, Ser: 1.4 mg/dL — ABNORMAL HIGH (ref 0.57–1.00)
Globulin, Total: 2.3 g/dL (ref 1.5–4.5)
Glucose: 142 mg/dL — ABNORMAL HIGH (ref 70–99)
Potassium: 4.9 mmol/L (ref 3.5–5.2)
Sodium: 142 mmol/L (ref 134–144)
Total Protein: 6.9 g/dL (ref 6.0–8.5)
eGFR: 40 mL/min/{1.73_m2} — ABNORMAL LOW (ref >59)

## 2021-11-05 LAB — TSH: TSH: 2.28 u[IU]/mL (ref 0.450–4.500)

## 2021-11-05 LAB — VITAMIN B12: Vitamin B-12: 575 pg/mL (ref 232–1245)

## 2021-11-05 LAB — VITAMIN D 25 HYDROXY (VIT D DEFICIENCY, FRACTURES): Vit D, 25-Hydroxy: 29.4 ng/mL — ABNORMAL LOW (ref 30.0–100.0)

## 2021-11-05 LAB — MAGNESIUM: Magnesium: 2 mg/dL (ref 1.6–2.3)

## 2021-11-05 NOTE — Progress Notes (Signed)
Good morning crew, please let Jessica Montoya know her labs have returned: - Kidney function continues to show ongoing stage 3b kidney disease, which we will continue to monitor -- would be good to put you back on blood pressure medication to help with protection -- but for now we will monitor as your blood pressure runs lower. - Vitamin D remains on lower side, continue supplement. - Remainder of labs are all stable.  Any questions? Keep being stellar!!  Thank you for allowing me to participate in your care.  I appreciate you. Kindest regards, Larie Mathes

## 2021-11-16 ENCOUNTER — Ambulatory Visit: Payer: Medicare Other | Admitting: Nurse Practitioner

## 2021-11-16 DIAGNOSIS — M8589 Other specified disorders of bone density and structure, multiple sites: Secondary | ICD-10-CM | POA: Diagnosis not present

## 2021-11-16 DIAGNOSIS — N1831 Chronic kidney disease, stage 3a: Secondary | ICD-10-CM | POA: Diagnosis not present

## 2021-11-16 DIAGNOSIS — Z794 Long term (current) use of insulin: Secondary | ICD-10-CM | POA: Diagnosis not present

## 2021-11-16 DIAGNOSIS — E1122 Type 2 diabetes mellitus with diabetic chronic kidney disease: Secondary | ICD-10-CM | POA: Diagnosis not present

## 2021-11-16 DIAGNOSIS — Z8781 Personal history of (healed) traumatic fracture: Secondary | ICD-10-CM | POA: Diagnosis not present

## 2021-11-16 DIAGNOSIS — E1142 Type 2 diabetes mellitus with diabetic polyneuropathy: Secondary | ICD-10-CM | POA: Diagnosis not present

## 2021-11-16 DIAGNOSIS — E1129 Type 2 diabetes mellitus with other diabetic kidney complication: Secondary | ICD-10-CM | POA: Diagnosis not present

## 2021-11-16 DIAGNOSIS — R809 Proteinuria, unspecified: Secondary | ICD-10-CM | POA: Diagnosis not present

## 2021-11-18 ENCOUNTER — Telehealth: Payer: Self-pay | Admitting: Nurse Practitioner

## 2021-11-18 NOTE — Telephone Encounter (Signed)
Copied from [place] [id number]. Topic: General - Other >> Nov 18, 2021  1:37 PM [name] wrote: Reason for CRM: Patient called in and wanted ti inform [name] that her pharmacy [facility] ([place], [address] [place] Phone:   [phone]   Fax: [phone]   contacted her about the Rx Suvorexant (BELSOMRA) 5 MG TABS. She was informed that Rx is on back order and by the time it come in the Rx on file will have run out so asking if Ms Ned Card wanted to send for something different. Please call patient with an update at  Ph#  [phone]

## 2021-11-18 NOTE — Telephone Encounter (Signed)
Routing to provider to advise.  

## 2021-11-19 ENCOUNTER — Ambulatory Visit
Admission: RE | Admit: 2021-11-19 | Discharge: 2021-11-19 | Disposition: A | Discharge status: Home / Self Care | Payer: Medicare Other | Source: Ambulatory Visit | Attending: Nurse Practitioner | Admitting: Nurse Practitioner

## 2021-11-19 ENCOUNTER — Other Ambulatory Visit: Payer: Self-pay

## 2021-11-19 DIAGNOSIS — Z1231 Encounter for screening mammogram for malignant neoplasm of breast: Secondary | ICD-10-CM | POA: Diagnosis not present

## 2021-11-19 NOTE — Telephone Encounter (Signed)
Left a message for patient informing her of Jolene's recommendations and informed patient that Jolene would send her over a short supply to her local pharmacy, but prefers not to change her medication at this time. Advised patient to give our office a call back to clarify if she would want a short supply.

## 2021-11-19 NOTE — Progress Notes (Signed)
Please let Imo know she had a normal mammogram and can repeat in one year.

## 2021-11-23 DIAGNOSIS — E1065 Type 1 diabetes mellitus with hyperglycemia: Secondary | ICD-10-CM | POA: Diagnosis not present

## 2021-12-04 ENCOUNTER — Ambulatory Visit: Payer: Medicare Other

## 2021-12-08 ENCOUNTER — Ambulatory Visit: Payer: Medicare Other

## 2021-12-10 DIAGNOSIS — H43813 Vitreous degeneration, bilateral: Secondary | ICD-10-CM | POA: Diagnosis not present

## 2021-12-10 LAB — HM DIABETES EYE EXAM

## 2021-12-21 DIAGNOSIS — E1065 Type 1 diabetes mellitus with hyperglycemia: Secondary | ICD-10-CM | POA: Diagnosis not present

## 2021-12-23 ENCOUNTER — Telehealth: Payer: Self-pay | Admitting: Nurse Practitioner

## 2021-12-23 NOTE — Telephone Encounter (Signed)
Copied from [place] [id number]. Topic: Medicare AWV ?>> Dec 23, 2021  3:30 PM Lavonia Drafts wrote: ?Reason for CRM:  ?N/A unable to leave a message for patient to call back and schedule the Medicare Annual Wellness Visit (AWV) virtually or by telephone. ? ?Last AWV 12/01/20 ? ?Please schedule at anytime with [facility] Health Advisor. ? ?45 minute appointment ? ?Any questions, please call me at [phone] ?

## 2021-12-27 NOTE — Patient Instructions (Incomplete)

## 2021-12-30 ENCOUNTER — Ambulatory Visit: Payer: Medicare Other | Admitting: Nurse Practitioner

## 2022-01-21 DIAGNOSIS — E1065 Type 1 diabetes mellitus with hyperglycemia: Secondary | ICD-10-CM | POA: Diagnosis not present

## 2022-02-26 DIAGNOSIS — E1065 Type 1 diabetes mellitus with hyperglycemia: Secondary | ICD-10-CM | POA: Diagnosis not present

## 2022-03-23 ENCOUNTER — Telehealth: Payer: Self-pay | Admitting: Nurse Practitioner

## 2022-03-23 NOTE — Telephone Encounter (Signed)
Copied from [place] [id number]. Topic: Medicare AWV >> Mar 23, 2022  3:01 PM Josephina Gip wrote: Reason for CRM: N/A unable to leave a message for patient to call back and schedule the Medicare Annual Wellness Visit (AWV) virtually or by telephone.  Last AWV 12/01/20  Please schedule at anytime with [facility] Health Advisor.   Any questions, please call me at [phone]

## 2022-03-29 DIAGNOSIS — E1065 Type 1 diabetes mellitus with hyperglycemia: Secondary | ICD-10-CM | POA: Diagnosis not present

## 2022-04-27 ENCOUNTER — Ambulatory Visit (INDEPENDENT_AMBULATORY_CARE_PROVIDER_SITE_OTHER): Payer: Medicare Other | Admitting: *Deleted

## 2022-04-27 DIAGNOSIS — Z0000 Encounter for general adult medical examination without abnormal findings: Secondary | ICD-10-CM | POA: Diagnosis not present

## 2022-04-27 NOTE — Progress Notes (Signed)
Subjective:   Jessica Montoya is a 71 y.o. female who presents for Medicare Annual (Subsequent) preventive examination.  I connected with  Rhiana Morash on 04/27/22 by a telephone enabled telemedicine application and verified that I am speaking with the correct person using two identifiers.   I discussed the limitations of evaluation and management by telemedicine. The patient expressed understanding and agreed to proceed.  Patient location: home  Provider location: Tele-Health-home   Review of Systems     Cardiac Risk Factors include: advanced age (>90mn, >>3women);diabetes mellitus;sedentary lifestyle;hypertension     Objective:    Today's Vitals   04/27/22 1006  PainSc: 8    There is no height or weight on file to calculate BMI.     12/01/2020    1:02 PM 02/05/2020    8:21 AM 12/11/2019    4:13 PM 11/28/2019    1:21 PM 03/27/2019    6:33 AM 02/20/2019    9:17 AM 06/01/2018    2:33 PM  Advanced Directives  Does Patient Have a Medical Advance Directive? No No No No No No No  Would patient like information on creating a medical advance directive?  No - Patient declined No - Patient declined  No - Patient declined No - Patient declined Yes (MAU/Ambulatory/Procedural Areas - Information given)    Current Medications (verified) Outpatient Encounter Medications as of 04/27/2022  Medication Sig   aspirin 81 MG tablet Take 81 mg by mouth daily.   Cholecalciferol (VITAMIN D PO) Take 2,000 Units by mouth daily.    Dulaglutide (TRULICITY [place]) Inject 1.5 mg into the skin once a week.   FLUoxetine (PROZAC) 40 MG capsule TAKE 1 CAPSULE BY MOUTH  DAILY   insulin regular human CONCENTRATED (HUMULIN R) 500 UNIT/ML kwikpen Inject 40 units at 8 AM. Inject 40 units at 2 PM. Inject 20 units at 8 PM.   Multiple Vitamin (MULTIVITAMIN) tablet Take 1 tablet by mouth daily.   omeprazole (PRILOSEC) 20 MG capsule Take 1 capsule (20 mg total) by mouth 2 (two) times daily before a meal.    simvastatin (ZOCOR) 40 MG tablet Take 1 tablet (40 mg total) by mouth daily.   vitamin B-12 (CYANOCOBALAMIN) 1000 MCG tablet Take 2,000 mcg by mouth daily.   ferrous fumarate (HEMOCYTE - 106 MG FE) 325 (106 FE) MG TABS tablet Take 1 tablet by mouth daily.   Suvorexant (BELSOMRA) 5 MG TABS Take 5 mg by mouth at bedtime as needed. (Patient not taking: Reported on 04/27/2022)   No facility-administered encounter medications on file as of 04/27/2022.    Allergies (verified) Compazine [prochlorperazine edisylate], Exenatide, and Insulin aspart   History: Past Medical History:  Diagnosis Date   Anxiety    Arthritis    knees   Chronic pain    CKD (chronic kidney disease), stage II    Depression    Diabetes mellitus without complication ([facility]    Diabetic neuropathy ([facility]    Fatigue    a. 05/2018 Echo: EF 60-65%, no rwma, nl RV fxn; b. 05/2018 Lexiscan MV: EF>65%. No ischemia/infarct.   Hyperlipidemia    Hypertension    Insomnia    Long term current use of insulin (HCC)    Peripheral neuropathy    Vertigo    2x/month   Wears dentures    full upper and lower   Past Surgical History:  Procedure Laterality Date   ABDOMINAL HYSTERECTOMY     CATARACT EXTRACTION W/PHACO Left 02/20/2019   Procedure:  CATARACT EXTRACTION PHACO AND INTRAOCULAR LENS PLACEMENT ([place]) LEFT DIABETES;  Surgeon: Birder Robson, MD;  Location: [facility];  Service: Ophthalmology;  Laterality: Left;   CATARACT EXTRACTION W/PHACO Right 03/27/2019   Procedure: CATARACT EXTRACTION PHACO AND INTRAOCULAR LENS PLACEMENT ([place])  RIGHT DIABETIC;  Surgeon: Birder Robson, MD;  Location: [facility];  Service: Ophthalmology;  Laterality: Right;  Diabetic - insulin   COLON SURGERY     COLONOSCOPY WITH PROPOFOL N/A 02/05/2020   Procedure: COLONOSCOPY WITH PROPOFOL;  Surgeon: Virgel Manifold, MD;  Location: [facility];  Service: Endoscopy;  Laterality: N/A;   ESOPHAGOGASTRODUODENOSCOPY (EGD) WITH PROPOFOL  N/A 02/05/2020   Procedure: ESOPHAGOGASTRODUODENOSCOPY (EGD) WITH PROPOFOL;  Surgeon: Virgel Manifold, MD;  Location: [facility] END[facility];  Service: Endoscopy;  Laterality: N/A;   Family History  Problem Relation Age of Onset   Cancer Mother        melanoma   Breast cancer Mother 60   Cancer Father        pancreatic   Hypertension Maternal Grandmother    Cancer Maternal Grandmother        colon   Cancer Paternal Grandmother        stomach   Stroke Paternal Grandfather    Cancer Maternal Grandfather        kidney   Social History   Socioeconomic History   Marital status: Married    Spouse name: Not on file   Number of children: Not on file   Years of education: Not on file   Highest education level: High school graduate  Occupational History   Occupation: retired  Tobacco Use   Smoking status: Never   Smokeless tobacco: Never  Vaping Use   Vaping Use: Never used  Substance and Sexual Activity   Alcohol use: No    Alcohol/week: 0.0 standard drinks of alcohol   Drug use: No   Sexual activity: Yes  Other Topics Concern   Not on file  Social History Narrative   Not on file   Social Determinants of Health   Financial Resource Strain: Low Risk  (04/27/2022)   Overall Financial Resource Strain (CARDIA)    Difficulty of Paying Living Expenses: Not hard at all  Food Insecurity: No Food Insecurity (04/27/2022)   Hunger Vital Sign    Worried About Running Out of Food in the Last Year: Never true    [place] in the Last Year: Never true  Transportation Needs: No Transportation Needs (04/27/2022)   PRAPARE - Hydrologist (Medical): No    Lack of Transportation (Non-Medical): No  Physical Activity: Inactive (04/27/2022)   Exercise Vital Sign    Days of Exercise per Week: 0 days    Minutes of Exercise per Session: 0 min  Stress: No Stress Concern Present (04/27/2022)   [place]    Feeling of Stress : Not at all  Social Connections: Moderately Isolated (04/27/2022)   Social Connection and Isolation Panel [NHANES]    Frequency of Communication with Friends and Family: More than three times a week    Frequency of Social Gatherings with Friends and Family: Once a week    Attends Religious Services: Never    Marine scientist or Organizations: No    Attends Archivist Meetings: Never    Marital Status: Married    Tobacco Counseling Counseling given: Not Answered   Clinical Intake:  Pre-visit preparation completed:  Yes  Pain : 0-10 Pain Score: 8  Pain Type: Acute pain Pain Location: Back (had an injury pulling muschle) Pain Orientation: Posterior Pain Descriptors / Indicators: Constant, Burning, Aching Pain Onset: 1 to 4 weeks ago Pain Frequency: Intermittent Pain Relieving Factors: tylenol, ice/heat  Pain Relieving Factors: tylenol, ice/heat  Nutritional Risks: None Diabetes: Yes CBG done?: No Did pt. bring in CBG monitor from home?: No  How often do you need to have someone help you when you read instructions, pamphlets, or other written materials from your doctor or pharmacy?: 1 - Never  Diabetic?  Yes  Nutrition Risk Assessment:  Has the patient had any N/V/D within the last 2 months?  No  Does the patient have any non-healing wounds?  No  Has the patient had any unintentional weight loss or weight gain?  No   Diabetes:  Is the patient diabetic?  Yes  If diabetic, was a CBG obtained today?  No  Did the patient bring in their glucometer from home?  No  How often do you monitor your CBG's? 4 x a day.   Financial Strains and Diabetes Management:  Are you having any financial strains with the device, your supplies or your medication? No .  Does the patient want to be seen by Chronic Care Management for management of their diabetes?  No  Would the patient like to be referred to a Nutritionist or for Diabetic  Management?  No   Diabetic Exams:  Diabetic Eye Exam:  Pt has been advised about the importance in completing this exam. Diabetic Foot Exam:  Pt has been advised about the importance in completing this exam.    Activities of Daily Living    04/27/2022   10:28 AM  In your present state of health, do you have any difficulty performing the following activities:  Hearing? 0  Vision? 0  Difficulty concentrating or making decisions? 0  Walking or climbing stairs? 0  Dressing or bathing? 0  Doing errands, shopping? 0  Preparing Food and eating ? N  Using the Toilet? N  In the past six months, have you accidently leaked urine? Y  Do you have problems with loss of bowel control? N  Managing your Medications? N  Managing your Finances? N  Housekeeping or managing your Housekeeping? N    Patient Care Team: [name], NP as PCP - General (Nurse Practitioner) Wellington Hampshire, MD as PCP - Cardiology (Cardiology) Vladimir Crofts, MD (Neurology) Wellington Hampshire, MD as Consulting Physician (Cardiology) [name] Betsey Holiday, MD as Physician Assistant (Endocrinology)  Indicate any recent Medical Services you may have received from other than [facility] providers in the past year (date may be approximate).     Assessment:   This is a routine wellness examination for Suri.  Hearing/Vision screen Hearing Screening - Comments::  A little hearing loss but not enough for hearing aids Vision Screening - Comments:: Up to date [facility]  Dietary issues and exercise activities discussed: Current Exercise Habits: The patient does not participate in regular exercise at present   Goals Addressed             This Visit's Progress    DIET - [place]   Not on track    Recommend drinking at least 6-8 glasses of water a day      Patient Stated       Try to increase mobility to reduce falls       Depression Screen  04/27/2022   10:18 AM 11/04/2021    3:12 PM 07/31/2021     1:48 PM 05/15/2021    3:58 PM 12/01/2020    1:03 PM 11/04/2020    2:01 PM 01/28/2020    3:45 PM  PHQ 2/9 Scores  PHQ - 2 Score 2 0 0 3 0 0 2  PHQ- 9 Score 8 6 0 19 6 0 8    Fall Risk    04/27/2022   10:10 AM 07/31/2021    1:48 PM 12/01/2020    1:02 PM 11/04/2020    2:00 PM 11/28/2019    1:13 PM  Fall Risk   Falls in the past year? '1 1 1 '$ 0 1  Comment   lost balance    Number falls in past yr: '1 1 1 '$ 0 0  Injury with Fall? '1 1 1  1  '$ Comment   broke arm    Risk for fall due to : Impaired balance/gait;History of fall(s) Impaired balance/gait Medication side effect;Impaired balance/gait  Impaired balance/gait  Follow up Falls evaluation completed;Education provided;Falls prevention discussed Falls evaluation completed Falls evaluation completed;Education provided;Falls prevention discussed  Falls prevention discussed    FALL RISK PREVENTION PERTAINING TO THE HOME:  Any stairs in or around the home? No  If so, are there any without handrails? No  Home free of loose throw rugs in walkways, pet beds, electrical cords, etc? Yes  Adequate lighting in your home to reduce risk of falls? Yes   ASSISTIVE DEVICES UTILIZED TO PREVENT FALLS:  Life alert? No  Use of a cane, walker or w/c? Yes  Grab bars in the bathroom? Yes  Shower chair or bench in shower? Yes  Elevated toilet seat or a handicapped toilet? No   TIMED UP AND GO:  Was the test performed? No .    Cognitive Function:        04/27/2022   10:20 AM 12/01/2020    1:06 PM 06/01/2018    2:37 PM  6CIT Screen  What Year? 0 points 0 points 0 points  What month? 0 points 0 points 0 points  What time? 0 points 0 points 0 points  Count back from 20 0 points 0 points 0 points  Months in reverse 0 points 0 points 0 points  Repeat phrase 0 points 0 points 0 points  Total Score 0 points 0 points 0 points    Immunizations Immunization History  Administered Date(s) Administered   Pneumococcal Conjugate-13 08/06/2015   Pneumococcal  Polysaccharide-23 03/06/2005, 11/24/2016   Td 10/04/2005   Zoster, Live 01/14/2014    TDAP status: Due, Education has been provided regarding the importance of this vaccine. Advised may receive this vaccine at local pharmacy or Health Dept. Aware to provide a copy of the vaccination record if obtained from local pharmacy or Health Dept. Verbalized acceptance and understanding.  Flu Vaccine status: Declined, Education has been provided regarding the importance of this vaccine but patient still declined. Advised may receive this vaccine at local pharmacy or Health Dept. Aware to provide a copy of the vaccination record if obtained from local pharmacy or Health Dept. Verbalized acceptance and understanding.  Pneumococcal vaccine status: Up to date  Covid-19 vaccine status: Declined, Education has been provided regarding the importance of this vaccine but patient still declined. Advised may receive this vaccine at local pharmacy or Health Dept.or vaccine clinic. Aware to provide a copy of the vaccination record if obtained from local pharmacy or Health Dept. Verbalized acceptance and understanding.  Qualifies for Shingles Vaccine? Yes   Zostavax completed Yes   Shingrix Completed?: No.    Education has been provided regarding the importance of this vaccine. Patient has been advised to call insurance company to determine out of pocket expense if they have not yet received this vaccine. Advised may also receive vaccine at local pharmacy or Health Dept. Verbalized acceptance and understanding.  Screening Tests Health Maintenance  Topic Date Due   Zoster Vaccines- Shingrix (1 of 2) 07/28/2022 (Originally 07/07/2000)   TETANUS/TDAP  04/28/2023 (Originally 10/05/2015)   INFLUENZA VACCINE  05/04/2022   HEMOGLOBIN A1C  05/04/2022   FOOT EXAM  11/04/2022   URINE MICROALBUMIN  11/04/2022   OPHTHALMOLOGY EXAM  12/11/2022   MAMMOGRAM  11/20/2023   COLONOSCOPY (Pts 45-64yr Insurance coverage will need to be  confirmed)  02/04/2025   Pneumonia Vaccine 71 Years old  Completed   DEXA SCAN  Completed   Hepatitis C Screening  Completed   HPV VACCINES  Aged Out   COVID-19 Vaccine  Discontinued    Health Maintenance  There are no preventive care reminders to display for this patient.   Colorectal cancer screening: Type of screening: Colonoscopy. Completed 2021. Repeat every 5 years  Mammogram status: Completed  . Repeat every year  Bone Density status: Completed  . Results reflect: Bone density results: OSTEOPENIA. Repeat every 2 years.  Lung Cancer Screening: (Low Dose CT Chest recommended if Age 348-80years, 30 pack-year currently smoking OR have quit w/in 15years.) does not qualify.   Lung Cancer Screening Referral:   Additional Screening:  Hepatitis C Screening: does not qualify; Completed 2016  Vision Screening: Recommended annual ophthalmology exams for early detection of glaucoma and other disorders of the eye. Is the patient up to date with their annual eye exam?  Yes  Who is the provider or what is the name of the office in which the patient attends annual eye exams? [facility] If pt is not established with a provider, would they like to be referred to a provider to establish care? No .   Dental Screening: Recommended annual dental exams for proper oral hygiene  Community Resource Referral / Chronic Care Management: CRR required this visit?  No   CCM required this visit?  No      Plan:     I have personally reviewed and noted the following in the patient's chart:   Medical and social history Use of alcohol, tobacco or illicit drugs  Current medications and supplements including opioid prescriptions.  Functional ability and status Nutritional status Physical activity Advanced directives List of other physicians Hospitalizations, surgeries, and ER visits in previous 12 months Vitals Screenings to include cognitive, depression, and falls Referrals and  appointments  In addition, I have reviewed and discussed with patient certain preventive protocols, quality metrics, and best practice recommendations. A written personalized care plan for preventive services as well as general preventive health recommendations were provided to patient.     JLeroy Kennedy LPN   [id number]  Nurse Notes:

## 2022-04-27 NOTE — Patient Instructions (Signed)
Jessica Montoya , Thank you for taking time to come for your Medicare Wellness Visit. I appreciate your ongoing commitment to your health goals. Please review the following plan we discussed and let me know if I can assist you in the future.   Screening recommendations/referrals: Colonoscopy: up tod ate Mammogram: up to date Bone Density: up to date Recommended yearly ophthalmology/optometry visit for glaucoma screening and checkup Recommended yearly dental visit for hygiene and checkup  Vaccinations: Influenza vaccine: declined  Pneumococcal vaccine: up to date Tdap vaccine: Education provided Shingles vaccine: Education provided    Advanced directives: Education provided  Conditions/risks identified:   Next appointment: call and schedule an appointment at your convience   Preventive Care 75 Years and Older, Female Preventive care refers to lifestyle choices and visits with your health care provider that can promote health and wellness. What does preventive care include? A yearly physical exam. This is also called an annual well check. Dental exams once or twice a year. Routine eye exams. Ask your health care provider how often you should have your eyes checked. Personal lifestyle choices, including: Daily care of your teeth and gums. Regular physical activity. Eating a healthy diet. Avoiding tobacco and drug use. Limiting alcohol use. Practicing safe sex. Taking low-dose aspirin every day. Taking vitamin and mineral supplements as recommended by your health care provider. What happens during an annual well check? The services and screenings done by your health care provider during your annual well check will depend on your age, overall health, lifestyle risk factors, and family history of disease. Counseling  Your health care provider may ask you questions about your: Alcohol use. Tobacco use. Drug use. Emotional well-being. Home and relationship well-being. Sexual  activity. Eating habits. History of falls. Memory and ability to understand (cognition). Work and work Statistician. Reproductive health. Screening  You may have the following tests or measurements: Height, weight, and BMI. Blood pressure. Lipid and cholesterol levels. These may be checked every 5 years, or more frequently if you are over 79 years old. Skin check. Lung cancer screening. You may have this screening every year starting at age 57 if you have a 30-pack-year history of smoking and currently smoke or have quit within the past 15 years. Fecal occult blood test (FOBT) of the stool. You may have this test every year starting at age 24. Flexible sigmoidoscopy or colonoscopy. You may have a sigmoidoscopy every 5 years or a colonoscopy every 10 years starting at age 61. Hepatitis C blood test. Hepatitis B blood test. Sexually transmitted disease (STD) testing. Diabetes screening. This is done by checking your blood sugar (glucose) after you have not eaten for a while (fasting). You may have this done every 1-3 years. Bone density scan. This is done to screen for osteoporosis. You may have this done starting at age 69. Mammogram. This may be done every 1-2 years. Talk to your health care provider about how often you should have regular mammograms. Talk with your health care provider about your test results, treatment options, and if necessary, the need for more tests. Vaccines  Your health care provider may recommend certain vaccines, such as: Influenza vaccine. This is recommended every year. Tetanus, diphtheria, and acellular pertussis (Tdap, Td) vaccine. You may need a Td booster every 10 years. Zoster vaccine. You may need this after age 43. Pneumococcal 13-valent conjugate (PCV13) vaccine. One dose is recommended after age 23. Pneumococcal polysaccharide (PPSV23) vaccine. One dose is recommended after age 75. Talk to your health  care provider about which screenings and vaccines  you need and how often you need them. This information is not intended to replace advice given to you by your health care provider. Make sure you discuss any questions you have with your health care provider. Document Released: 10/17/2015 Document Revised: 06/09/2016 Document Reviewed: 07/22/2015 Elsevier Interactive Patient Education  2017 [employer] Prevention in the Home Falls can cause injuries. They can happen to people of all ages. There are many things you can do to make your home safe and to help prevent falls. What can I do on the outside of my home? Regularly fix the edges of walkways and driveways and fix any cracks. Remove anything that might make you trip as you walk through a door, such as a raised step or threshold. Trim any bushes or trees on the path to your home. Use bright outdoor lighting. Clear any walking paths of anything that might make someone trip, such as rocks or tools. Regularly check to see if handrails are loose or broken. Make sure that both sides of any steps have handrails. Any raised decks and porches should have guardrails on the edges. Have any leaves, snow, or ice cleared regularly. Use sand or salt on walking paths during winter. Clean up any spills in your garage right away. This includes oil or grease spills. What can I do in the bathroom? Use night lights. Install grab bars by the toilet and in the tub and shower. Do not use towel bars as grab bars. Use non-skid mats or decals in the tub or shower. If you need to sit down in the shower, use a plastic, non-slip stool. Keep the floor dry. Clean up any water that spills on the floor as soon as it happens. Remove soap buildup in the tub or shower regularly. Attach bath mats securely with double-sided non-slip rug tape. Do not have throw rugs and other things on the floor that can make you trip. What can I do in the bedroom? Use night lights. Make sure that you have a light by your bed that  is easy to reach. Do not use any sheets or blankets that are too big for your bed. They should not hang down onto the floor. Have a firm chair that has side arms. You can use this for support while you get dressed. Do not have throw rugs and other things on the floor that can make you trip. What can I do in the kitchen? Clean up any spills right away. Avoid walking on wet floors. Keep items that you use a lot in easy-to-reach places. If you need to reach something above you, use a strong step stool that has a grab bar. Keep electrical cords out of the way. Do not use floor polish or wax that makes floors slippery. If you must use wax, use non-skid floor wax. Do not have throw rugs and other things on the floor that can make you trip. What can I do with my stairs? Do not leave any items on the stairs. Make sure that there are handrails on both sides of the stairs and use them. Fix handrails that are broken or loose. Make sure that handrails are as long as the stairways. Check any carpeting to make sure that it is firmly attached to the stairs. Fix any carpet that is loose or worn. Avoid having throw rugs at the top or bottom of the stairs. If you do have throw rugs, attach them to  the floor with carpet tape. Make sure that you have a light switch at the top of the stairs and the bottom of the stairs. If you do not have them, ask someone to add them for you. What else can I do to help prevent falls? Wear shoes that: Do not have high heels. Have rubber bottoms. Are comfortable and fit you well. Are closed at the toe. Do not wear sandals. If you use a stepladder: Make sure that it is fully opened. Do not climb a closed stepladder. Make sure that both sides of the stepladder are locked into place. Ask someone to hold it for you, if possible. Clearly mark and make sure that you can see: Any grab bars or handrails. First and last steps. Where the edge of each step is. Use tools that help you  move around (mobility aids) if they are needed. These include: Canes. Walkers. Scooters. Crutches. Turn on the lights when you go into a dark area. Replace any light bulbs as soon as they burn out. Set up your furniture so you have a clear path. Avoid moving your furniture around. If any of your floors are uneven, fix them. If there are any pets around you, be aware of where they are. Review your medicines with your doctor. Some medicines can make you feel dizzy. This can increase your chance of falling. Ask your doctor what other things that you can do to help prevent falls. This information is not intended to replace advice given to you by your health care provider. Make sure you discuss any questions you have with your health care provider. Document Released: 07/17/2009 Document Revised: 02/26/2016 Document Reviewed: 10/25/2014 Elsevier Interactive Patient Education  2017 [employer].

## 2022-04-28 DIAGNOSIS — E1065 Type 1 diabetes mellitus with hyperglycemia: Secondary | ICD-10-CM | POA: Diagnosis not present

## 2022-05-12 DIAGNOSIS — Z794 Long term (current) use of insulin: Secondary | ICD-10-CM | POA: Diagnosis not present

## 2022-05-12 DIAGNOSIS — N1831 Chronic kidney disease, stage 3a: Secondary | ICD-10-CM | POA: Diagnosis not present

## 2022-05-12 DIAGNOSIS — E1122 Type 2 diabetes mellitus with diabetic chronic kidney disease: Secondary | ICD-10-CM | POA: Diagnosis not present

## 2022-05-20 DIAGNOSIS — E1122 Type 2 diabetes mellitus with diabetic chronic kidney disease: Secondary | ICD-10-CM | POA: Diagnosis not present

## 2022-05-20 DIAGNOSIS — N1831 Chronic kidney disease, stage 3a: Secondary | ICD-10-CM | POA: Diagnosis not present

## 2022-05-20 DIAGNOSIS — E1129 Type 2 diabetes mellitus with other diabetic kidney complication: Secondary | ICD-10-CM | POA: Diagnosis not present

## 2022-05-20 DIAGNOSIS — M8589 Other specified disorders of bone density and structure, multiple sites: Secondary | ICD-10-CM | POA: Diagnosis not present

## 2022-05-20 DIAGNOSIS — R809 Proteinuria, unspecified: Secondary | ICD-10-CM | POA: Diagnosis not present

## 2022-05-20 DIAGNOSIS — Z794 Long term (current) use of insulin: Secondary | ICD-10-CM | POA: Diagnosis not present

## 2022-05-20 DIAGNOSIS — E1142 Type 2 diabetes mellitus with diabetic polyneuropathy: Secondary | ICD-10-CM | POA: Diagnosis not present

## 2022-05-20 DIAGNOSIS — Z8781 Personal history of (healed) traumatic fracture: Secondary | ICD-10-CM | POA: Diagnosis not present

## 2022-05-29 DIAGNOSIS — E1065 Type 1 diabetes mellitus with hyperglycemia: Secondary | ICD-10-CM | POA: Diagnosis not present

## 2022-06-10 ENCOUNTER — Other Ambulatory Visit: Payer: Self-pay | Admitting: Nurse Practitioner

## 2022-06-11 NOTE — Telephone Encounter (Signed)
Not on current med list. Requested Prescriptions  Pending Prescriptions Disp Refills  . traZODone (DESYREL) 50 MG tablet [Pharmacy Med Name: traZODone HCl 50 MG Oral Tablet] 90 tablet 3    Sig: TAKE 1 TABLET BY MOUTH  DAILY AT BEDTIME     Psychiatry: Antidepressants - Serotonin Modulator Passed - 06/10/2022  6:50 AM      Passed - Completed PHQ-2 or PHQ-9 in the last 360 days      Passed - Valid encounter within last 6 months    Recent Outpatient Visits          7 months ago Type 2 diabetes mellitus with microalbuminuria, with long-term current use of insulin ([place])   [place], Jolene T, NP   10 months ago Wound of foot   [place], Lauren A, NP   1 year ago Type 2 diabetes mellitus with microalbuminuria, with long-term current use of insulin ([place])   [place], Westbrook T, NP   1 year ago Lab test positive for detection of COVID-19 virus   [place], [place] T, NP   1 year ago Uncontrolled type 2 diabetes mellitus with chronic kidney disease ([place])   [place], [name], NP

## 2022-06-29 DIAGNOSIS — E1065 Type 1 diabetes mellitus with hyperglycemia: Secondary | ICD-10-CM | POA: Diagnosis not present

## 2022-07-29 DIAGNOSIS — E1065 Type 1 diabetes mellitus with hyperglycemia: Secondary | ICD-10-CM | POA: Diagnosis not present

## 2022-08-24 ENCOUNTER — Ambulatory Visit: Payer: Self-pay | Admitting: *Deleted

## 2022-08-24 NOTE — Telephone Encounter (Signed)
Patient states she has had vaginal burning since yesterday. Patient is requesting fluconazole (DIFLUCAN) 150 MG tablet.  Reason for Disposition  MODERATE-SEVERE itching (i.e., interferes with school, work, or sleep)    "Burning"  Answer Assessment - Initial Assessment Questions 1. SYMPTOM: "What's the main symptom you're concerned about?" (e.g., pain, itching, dryness)     Vaginal burning 2. LOCATION: "Where is the   located?" (e.g., inside/outside, left/right)     Inside 3. ONSET: "When did the    start?"     Yesterday 4. PAIN: "Is there any pain?" If Yes, ask: "How bad is it?" (Scale: 1-10; mild, moderate, severe)   -  MILD (1-3): Doesn't interfere with normal activities.    -  MODERATE (4-7): Interferes with normal activities (e.g., work or school) or awakens from sleep.     -  SEVERE (8-10): Excruciating pain, unable to do any normal activities.     8/10 5. ITCHING: "Is there any itching?" If Yes, ask: "How bad is it?" (Scale: 1-10; mild, moderate, severe)     No 6. CAUSE: "What do you think is causing the discharge?" "Have you had the same problem before? What happened then?"     BS running high 7. OTHER SYMPTOMS: "Do you have any other symptoms?" (e.g., fever, itching, vaginal bleeding, pain with urination, injury to genital area, vaginal foreign body)     BS running high  Protocols used: Vaginal Symptoms-A-AH

## 2022-08-24 NOTE — Telephone Encounter (Signed)
  Chief Complaint: Vaginal burning Symptoms: Vaginal "Burning" since yesterday. No ATBs, denies itching "Just the burning now." States BS running higher than usual, 180's-200's past week, "Probably from that." Pt states has Appt Monday with Endocrinologist. Frequency: Yesterday Pertinent Negatives: Patient denies itching, discharge,rash Disposition: '[]'$ ED /'[]'$ Urgent Care (no appt availability in office) / '[]'$ Appointment(In office/virtual)/ '[]'$  [facility]/ '[]'$ Home Care/ '[]'$ Refused Recommended Disposition /'[]'$ [facility]/ '[x]'$  Follow-up with PCP Additional Notes: Pt requesting Diflucan be called in, advised may need appt, states will see Endo regarding BS Monday. Care advise provided per protocol. Pt verbalizes understanding.   Please advise. If appropriate [facility] in [place].

## 2022-08-25 NOTE — Telephone Encounter (Signed)
Called patient to let her know that an appointment is needed before any antibiotics can be sent in

## 2022-08-29 DIAGNOSIS — E1065 Type 1 diabetes mellitus with hyperglycemia: Secondary | ICD-10-CM | POA: Diagnosis not present

## 2022-09-28 DIAGNOSIS — E1065 Type 1 diabetes mellitus with hyperglycemia: Secondary | ICD-10-CM | POA: Diagnosis not present

## 2022-10-29 DIAGNOSIS — E1065 Type 1 diabetes mellitus with hyperglycemia: Secondary | ICD-10-CM | POA: Diagnosis not present

## 2022-11-29 DIAGNOSIS — E1065 Type 1 diabetes mellitus with hyperglycemia: Secondary | ICD-10-CM | POA: Diagnosis not present

## 2022-12-28 ENCOUNTER — Other Ambulatory Visit: Payer: Self-pay | Admitting: Nurse Practitioner

## 2022-12-28 NOTE — Telephone Encounter (Signed)
Requested Prescriptions  Pending Prescriptions Disp Refills   FLUoxetine (PROZAC) 40 MG capsule [Pharmacy Med Name: FLUOXETINE  40MG   CAP] 90 capsule 3    Sig: TAKE 1 CAPSULE BY MOUTH DAILY     Psychiatry:  Antidepressants - SSRI Failed - 12/28/2022  6:01 AM      Failed - Valid encounter within last 6 months    Recent Outpatient Visits           1 year ago Type 2 diabetes mellitus with microalbuminuria, with long-term current use of insulin ([place])   [place], Henrine Screws T, NP   1 year ago Wound of foot   [place] McElwee, Lauren A, NP   1 year ago Type 2 diabetes mellitus with microalbuminuria, with long-term current use of insulin ([place])   [place], Westphalia T, NP   1 year ago Lab test positive for detection of COVID-19 virus   [place], [place] T, NP   2 years ago Uncontrolled type 2 diabetes mellitus with chronic kidney disease ([place])   [place], [place] T, NP              Passed - Completed PHQ-2 or PHQ-9 in the last 360 days

## 2023-01-04 ENCOUNTER — Other Ambulatory Visit: Payer: Self-pay | Admitting: Nurse Practitioner

## 2023-01-04 NOTE — Telephone Encounter (Signed)
Requested medication (s) are due for refill today - expired Rx  Requested medication (s) are on the active medication list -yes  Future visit scheduled -no  Last refill: 11/04/21  Notes to clinic: expired Rx, fails lab protocol- over 1 year-11/04/21, Attempted to call patient to schedule appointment- left message on VM to call office  Requested Prescriptions  Pending Prescriptions Disp Refills   simvastatin (ZOCOR) 40 MG tablet [Pharmacy Med Name: Simvastatin 40 MG Oral Tablet] 100 tablet 2    Sig: TAKE 1 TABLET BY MOUTH DAILY     Cardiovascular:  Antilipid - Statins Failed - 01/04/2023  6:09 AM      Failed - Lipid Panel in normal range within the last 12 months    Cholesterol, Total  Date Value Ref Range Status  11/04/2021 140 100 - 199 mg/dL Final   Cholesterol Piccolo, Waived  Date Value Ref Range Status  05/21/2015 125 <200 mg/dL Final    Comment:                            Desirable                <200                         Borderline High      200- 239                         High                     >239    LDL Chol Calc (NIH)  Date Value Ref Range Status  11/04/2021 48 0 - 99 mg/dL Final   HDL  Date Value Ref Range Status  11/04/2021 68 >39 mg/dL Final   Triglycerides  Date Value Ref Range Status  11/04/2021 142 0 - 149 mg/dL Final   Triglycerides Piccolo,Waived  Date Value Ref Range Status  05/21/2015 225 (H) <150 mg/dL Final    Comment:                            Normal                   <150                         Borderline High     150 - 199                         High                200 - 499                         Very High                >499          Passed - Patient is not pregnant      Passed - Valid encounter within last 12 months    Recent Outpatient Visits           1 year ago Type 2 diabetes mellitus with microalbuminuria, with long-term current use of insulin ([place])   [place], [name], NP  1  year ago Wound of foot   [place], Lauren A, NP   1 year ago Type 2 diabetes mellitus with microalbuminuria, with long-term current use of insulin ([place])   [place], Aten T, NP   1 year ago Lab test positive for detection of COVID-19 virus   [place], Baxley T, NP   2 years ago Uncontrolled type 2 diabetes mellitus with chronic kidney disease ([place])   [place] [name], Jolene T, NP               omeprazole (PRILOSEC) 20 MG capsule [Pharmacy Med Name: Omeprazole 20 MG Oral Capsule Delayed Release] 200 capsule 2    Sig: TAKE 1 CAPSULE BY MOUTH TWICE  DAILY BEFORE A MEAL     Gastroenterology: Proton Pump Inhibitors Passed - 01/04/2023  6:09 AM      Passed - Valid encounter within last 12 months    Recent Outpatient Visits           1 year ago Type 2 diabetes mellitus with microalbuminuria, with long-term current use of insulin ([place])   [place], Henrine Screws T, NP   1 year ago Wound of foot   [place] McElwee, Lauren A, NP   1 year ago Type 2 diabetes mellitus with microalbuminuria, with long-term current use of insulin ([place])   [place], Henrine Screws T, NP   1 year ago Lab test positive for detection of COVID-19 virus   [place], [place] T, NP   2 years ago Uncontrolled type 2 diabetes mellitus with chronic kidney disease ([place])   [place], Henrine Screws T, NP                 Requested Prescriptions  Pending Prescriptions Disp Refills   simvastatin (ZOCOR) 40 MG tablet [Pharmacy Med Name: Simvastatin 40 MG Oral Tablet] 100 tablet 2    Sig: TAKE 1 TABLET BY MOUTH DAILY     Cardiovascular:  Antilipid - Statins Failed - 01/04/2023  6:09 AM      Failed - Lipid Panel in normal range within the  last 12 months    Cholesterol, Total  Date Value Ref Range Status  11/04/2021 140 100 - 199 mg/dL Final   Cholesterol Piccolo, Waived  Date Value Ref Range Status  05/21/2015 125 <200 mg/dL Final    Comment:                            Desirable                <200                         Borderline High      200- 239                         High                     >239    LDL Chol Calc (NIH)  Date Value Ref Range Status  11/04/2021 48 0 - 99 mg/dL Final   HDL  Date Value Ref Range Status  11/04/2021 68 >39 mg/dL Final   Triglycerides  Date  Value Ref Range Status  11/04/2021 142 0 - 149 mg/dL Final   Triglycerides Piccolo,Waived  Date Value Ref Range Status  05/21/2015 225 (H) <150 mg/dL Final    Comment:                            Normal                   <150                         Borderline High     150 - 199                         High                200 - 499                         Very High                >499          Passed - Patient is not pregnant      Passed - Valid encounter within last 12 months    Recent Outpatient Visits           1 year ago Type 2 diabetes mellitus with microalbuminuria, with long-term current use of insulin ([place])   [place], Henrine Screws T, NP   1 year ago Wound of foot   [place] McElwee, Lauren A, NP   1 year ago Type 2 diabetes mellitus with microalbuminuria, with long-term current use of insulin ([place])   [place], Henrine Screws T, NP   1 year ago Lab test positive for detection of COVID-19 virus   [place], [place] T, NP   2 years ago Uncontrolled type 2 diabetes mellitus with chronic kidney disease ([place])   [place] [name], Jolene T, NP               omeprazole (PRILOSEC) 20 MG capsule [Pharmacy Med Name: Omeprazole 20 MG Oral Capsule Delayed Release] 200 capsule 2     Sig: TAKE 1 CAPSULE BY MOUTH TWICE  DAILY BEFORE A MEAL     Gastroenterology: Proton Pump Inhibitors Passed - 01/04/2023  6:09 AM      Passed - Valid encounter within last 12 months    Recent Outpatient Visits           1 year ago Type 2 diabetes mellitus with microalbuminuria, with long-term current use of insulin ([place])   [place], [place] T, NP   1 year ago Wound of foot   [place] McElwee, Lauren A, NP   1 year ago Type 2 diabetes mellitus with microalbuminuria, with long-term current use of insulin ([place])   [place], [place] T, NP   1 year ago Lab test positive for detection of COVID-19 virus   [place], [place] T, NP   2 years ago Uncontrolled type 2 diabetes mellitus with chronic kidney disease ([place])   [place], [name], NP

## 2023-01-14 DIAGNOSIS — E113391 Type 2 diabetes mellitus with moderate nonproliferative diabetic retinopathy without macular edema, right eye: Secondary | ICD-10-CM | POA: Diagnosis not present

## 2023-01-14 DIAGNOSIS — E113312 Type 2 diabetes mellitus with moderate nonproliferative diabetic retinopathy with macular edema, left eye: Secondary | ICD-10-CM | POA: Diagnosis not present

## 2023-01-14 DIAGNOSIS — H43813 Vitreous degeneration, bilateral: Secondary | ICD-10-CM | POA: Diagnosis not present

## 2023-01-14 DIAGNOSIS — Z961 Presence of intraocular lens: Secondary | ICD-10-CM | POA: Diagnosis not present

## 2023-01-14 LAB — HM DIABETES EYE EXAM

## 2023-01-18 ENCOUNTER — Encounter: Payer: Self-pay | Admitting: Nurse Practitioner

## 2023-01-21 DIAGNOSIS — E113312 Type 2 diabetes mellitus with moderate nonproliferative diabetic retinopathy with macular edema, left eye: Secondary | ICD-10-CM | POA: Diagnosis not present

## 2023-01-28 DIAGNOSIS — E1165 Type 2 diabetes mellitus with hyperglycemia: Secondary | ICD-10-CM | POA: Diagnosis not present

## 2023-01-30 NOTE — Patient Instructions (Signed)
Diabetes Mellitus Basics  Diabetes mellitus, or diabetes, is a long-term (chronic) disease. It occurs when the body does not properly use sugar (glucose) that is released from food after you eat. Diabetes mellitus may be caused by one or both of these problems: Your pancreas does not make enough of a hormone called insulin. Your body does not react in a normal way to the insulin that it makes. Insulin lets glucose enter cells in your body. This gives you energy. If you have diabetes, glucose cannot get into cells. This causes high blood glucose (hyperglycemia). How to treat and manage diabetes You may need to take insulin or other diabetes medicines daily to keep your glucose in balance. If you are prescribed insulin, you will learn how to give yourself insulin by injection. You may need to adjust the amount of insulin you take based on the foods that you eat. You will need to check your blood glucose levels using a glucose monitor as told by your health care provider. The readings can help determine if you have low or high blood glucose. Generally, you should have these blood glucose levels: Before meals (preprandial): 80-130 mg/dL (4.4-7.2 mmol/L). After meals (postprandial): below 180 mg/dL (10 mmol/L). Hemoglobin A1c (HbA1c) level: less than 7%. Your health care provider will set treatment goals for you. Keep all follow-up visits. This is important. Follow these instructions at home: Diabetes medicines Take your diabetes medicines every day as told by your health care provider. List your diabetes medicines here: Name of medicine: ______________________________ Amount (dose): _______________ Time (a.m./p.m.): _______________ Notes: ___________________________________ Name of medicine: ______________________________ Amount (dose): _______________ Time (a.m./p.m.): _______________ Notes: ___________________________________ Name of medicine: ______________________________ Amount (dose):  _______________ Time (a.m./p.m.): _______________ Notes: ___________________________________ Insulin If you use insulin, list the types of insulin you use here: Insulin type: ______________________________ Amount (dose): _______________ Time (a.m./p.m.): _______________Notes: ___________________________________ Insulin type: ______________________________ Amount (dose): _______________ Time (a.m./p.m.): _______________ Notes: ___________________________________ Insulin type: ______________________________ Amount (dose): _______________ Time (a.m./p.m.): _______________ Notes: ___________________________________ Insulin type: ______________________________ Amount (dose): _______________ Time (a.m./p.m.): _______________ Notes: ___________________________________ Insulin type: ______________________________ Amount (dose): _______________ Time (a.m./p.m.): _______________ Notes: ___________________________________ Managing blood glucose  Check your blood glucose levels using a glucose monitor as told by your health care provider. Write down the times that you check your glucose levels here: Time: _______________ Notes: ___________________________________ Time: _______________ Notes: ___________________________________ Time: _______________ Notes: ___________________________________ Time: _______________ Notes: ___________________________________ Time: _______________ Notes: ___________________________________ Time: _______________ Notes: ___________________________________  Low blood glucose Low blood glucose (hypoglycemia) is when glucose is at or below 70 mg/dL (3.9 mmol/L). Symptoms may include: Feeling: Hungry. Sweaty and clammy. Irritable or easily upset. Dizzy. Sleepy. Having: A fast heartbeat. A headache. A change in your vision. Numbness around the mouth, lips, or tongue. Having trouble with: Moving (coordination). Sleeping. Treating low blood glucose To treat low blood  glucose, eat or drink something containing sugar right away. If you can think clearly and swallow safely, follow the 15:15 rule: Take 15 grams of a fast-acting carb (carbohydrate), as told by your health care provider. Some fast-acting carbs are: Glucose tablets: take 3-4 tablets. Hard candy: eat 3-5 pieces. Fruit juice: drink 4 oz (120 mL). Regular (not diet) soda: drink 4-6 oz (120-180 mL). Honey or sugar: eat 1 Tbsp (15 mL). Check your blood glucose levels 15 minutes after you take the carb. If your glucose is still at or below 70 mg/dL (3.9 mmol/L), take 15 grams of a carb again. If your glucose does not go above 70 mg/dL (3.9 mmol/L) after   3 tries, get help right away. After your glucose goes back to normal, eat a meal or a snack within 1 hour. Treating very low blood glucose If your glucose is at or below 54 mg/dL (3 mmol/L), you have very low blood glucose (severe hypoglycemia). This is an emergency. Do not wait to see if the symptoms will go away. Get medical help right away. Call your local emergency services (911 in the [place] Do not drive yourself to the hospital. Questions to ask your health care provider Should I talk with a diabetes educator? What equipment will I need to care for myself at home? What diabetes medicines do I need? When should I take them? How often do I need to check my blood glucose levels? What number can I call if I have questions? When is my follow-up visit? Where can I find a support group for people with diabetes? Where to find more information American Diabetes Association: www.diabetes.org Association of Diabetes Care and Education Specialists: www.diabeteseducator.org Contact a health care provider if: Your blood glucose is at or above 240 mg/dL (13.3 mmol/L) for 2 days in a row. You have been sick or have had a fever for 2 days or more, and you are not getting better. You have any of these problems for more than 6 hours: You cannot eat or  drink. You feel nauseous. You vomit. You have diarrhea. Get help right away if: Your blood glucose is lower than 54 mg/dL (3 mmol/L). You get confused. You have trouble thinking clearly. You have trouble breathing. These symptoms may represent a serious problem that is an emergency. Do not wait to see if the symptoms will go away. Get medical help right away. Call your local emergency services (911 in the [place] Do not drive yourself to the hospital. Summary Diabetes mellitus is a chronic disease that occurs when the body does not properly use sugar (glucose) that is released from food after you eat. Take insulin and diabetes medicines as told. Check your blood glucose every day, as often as told. Keep all follow-up visits. This is important. This information is not intended to replace advice given to you by your health care provider. Make sure you discuss any questions you have with your health care provider. Document Revised: 01/22/2020 Document Reviewed: 01/22/2020 Elsevier Patient Education  2023 [employer]  

## 2023-02-02 ENCOUNTER — Ambulatory Visit (INDEPENDENT_AMBULATORY_CARE_PROVIDER_SITE_OTHER): Payer: Medicare Other | Admitting: Nurse Practitioner

## 2023-02-02 ENCOUNTER — Encounter: Payer: Self-pay | Admitting: Nurse Practitioner

## 2023-02-02 VITALS — BP 105/73 | HR 94 | Temp 97.8°F | Ht 66.14 in | Wt 163.6 lb

## 2023-02-02 DIAGNOSIS — E559 Vitamin D deficiency, unspecified: Secondary | ICD-10-CM

## 2023-02-02 DIAGNOSIS — N183 Chronic kidney disease, stage 3 unspecified: Secondary | ICD-10-CM

## 2023-02-02 DIAGNOSIS — E1159 Type 2 diabetes mellitus with other circulatory complications: Secondary | ICD-10-CM

## 2023-02-02 DIAGNOSIS — R809 Proteinuria, unspecified: Secondary | ICD-10-CM

## 2023-02-02 DIAGNOSIS — K219 Gastro-esophageal reflux disease without esophagitis: Secondary | ICD-10-CM | POA: Diagnosis not present

## 2023-02-02 DIAGNOSIS — I152 Hypertension secondary to endocrine disorders: Secondary | ICD-10-CM | POA: Diagnosis not present

## 2023-02-02 DIAGNOSIS — Z85038 Personal history of other malignant neoplasm of large intestine: Secondary | ICD-10-CM | POA: Diagnosis not present

## 2023-02-02 DIAGNOSIS — M5442 Lumbago with sciatica, left side: Secondary | ICD-10-CM

## 2023-02-02 DIAGNOSIS — G8929 Other chronic pain: Secondary | ICD-10-CM

## 2023-02-02 DIAGNOSIS — F5101 Primary insomnia: Secondary | ICD-10-CM

## 2023-02-02 DIAGNOSIS — E1142 Type 2 diabetes mellitus with diabetic polyneuropathy: Secondary | ICD-10-CM | POA: Diagnosis not present

## 2023-02-02 DIAGNOSIS — E1129 Type 2 diabetes mellitus with other diabetic kidney complication: Secondary | ICD-10-CM

## 2023-02-02 DIAGNOSIS — E1169 Type 2 diabetes mellitus with other specified complication: Secondary | ICD-10-CM | POA: Diagnosis not present

## 2023-02-02 DIAGNOSIS — Z794 Long term (current) use of insulin: Secondary | ICD-10-CM

## 2023-02-02 DIAGNOSIS — M85851 Other specified disorders of bone density and structure, right thigh: Secondary | ICD-10-CM | POA: Diagnosis not present

## 2023-02-02 DIAGNOSIS — F331 Major depressive disorder, recurrent, moderate: Secondary | ICD-10-CM

## 2023-02-02 DIAGNOSIS — M5441 Lumbago with sciatica, right side: Secondary | ICD-10-CM

## 2023-02-02 DIAGNOSIS — E785 Hyperlipidemia, unspecified: Secondary | ICD-10-CM

## 2023-02-02 DIAGNOSIS — E1122 Type 2 diabetes mellitus with diabetic chronic kidney disease: Secondary | ICD-10-CM | POA: Diagnosis not present

## 2023-02-02 DIAGNOSIS — E538 Deficiency of other specified B group vitamins: Secondary | ICD-10-CM

## 2023-02-02 MED ORDER — BELSOMRA 5 MG PO TABS: 5.0000 mg | ORAL_TABLET | Freq: Every evening | ORAL | 3 refills | Status: DC | PRN

## 2023-02-02 MED ORDER — FLUOXETINE HCL 40 MG PO CAPS: ORAL_CAPSULE | ORAL | 4 refills | Status: DC

## 2023-02-02 MED ORDER — LIDOCAINE 5 % EX PTCH: 1.0000 | MEDICATED_PATCH | CUTANEOUS | 3 refills | Status: DC

## 2023-02-02 MED ORDER — OMEPRAZOLE 20 MG PO CPDR: DELAYED_RELEASE_CAPSULE | ORAL | 4 refills | Status: DC

## 2023-02-02 MED ORDER — SIMVASTATIN 40 MG PO TABS: 40.0000 mg | ORAL_TABLET | Freq: Every day | ORAL | 4 refills | Status: DC

## 2023-02-02 MED ORDER — RAMIPRIL 2.5 MG PO CAPS: 2.5000 mg | ORAL_CAPSULE | Freq: Every day | ORAL | 4 refills | Status: DC

## 2023-02-02 NOTE — Assessment & Plan Note (Signed)
Chronic, ongoing, followed by endo.  A1c 9.7% in March 2024 with endo and urine ALB 30 in August 2023. Continue current regimen at this time and continue collaboration with endo.   CMP today.  She was unable to leave urine specimen for albumin check.   - Vaccinations up to date. - Statin and ACE on board, although may need to hold ACE if BP trends down. - Foot and Eye exam up to date. 

## 2023-02-02 NOTE — Assessment & Plan Note (Signed)
Chronic issue with waxing and waning.  Continue simple treatment at home and ongoing stretches.  Lidocaine patches sent in.  May benefit imaging in future and referral to physical therapy, she wishes to hold off on these at this time.

## 2023-02-02 NOTE — Assessment & Plan Note (Signed)
Chronic, ongoing.  Having ongoing symptoms even with BID dosing.  Will continue Omeprazole 20 MG BID and get back into GI due to his history of colon cancer.  Recommend heavy focus on diet changes.  Discussed with patient today.  Risks of PPI use were discussed with patient including bone loss, C. Diff diarrhea, pneumonia, infections, CKD, electrolyte abnormalities.  Verbalizes understanding and chooses to continue the medication.

## 2023-02-02 NOTE — Assessment & Plan Note (Signed)
Chronic, ongoing.  Continue current medication regimen and adjust as needed.  Obtain lipid panel today. 

## 2023-02-02 NOTE — Assessment & Plan Note (Signed)
Chronic, ongoing.  Last DEXA 02/2021 at [facility] with ongoing osteopenia, mild decrease in T-score.  Continue Vitamin D supplement and recheck Vit D level today.  Collaborate with endo on changes in future if needed. °

## 2023-02-02 NOTE — Assessment & Plan Note (Signed)
History of in 2013, worsening GERD symptoms, will get back into GI for assessment.  Is maintaining weight and no red flags at this time.

## 2023-02-02 NOTE — Assessment & Plan Note (Signed)
Ongoing, continue daily supplement and recheck Vit D level today. 

## 2023-02-02 NOTE — Assessment & Plan Note (Signed)
Chronic, ongoing. Will continue Prozac 40 MG daily and adjust regimen as needed.  Denies SI/HI.  Adjust medication as needed and monitor NA+ level. °

## 2023-02-02 NOTE — Assessment & Plan Note (Signed)
Chronic, ongoing, followed by endo.  A1c 9.7% in March 2024 with endo and urine ALB 30 in August 2023. Continue current regimen at this time and continue collaboration with endo.   CMP today.  She was unable to leave urine specimen for albumin check.  Maintain off Gabapentin due to increased dizziness with this. - Vaccinations up to date. - Statin and ACE on board, although may need to hold ACE if BP trends down. - Foot and Eye exam up to date.

## 2023-02-02 NOTE — Assessment & Plan Note (Signed)
Ongoing, continue daily supplement and recheck B12 level today. 

## 2023-02-02 NOTE — Assessment & Plan Note (Addendum)
Chronic, ongoing with poor control -- continues to have ongoing fatigue issues and poor sleep.  Did not tolerate Trazodone nor benefit from it + no benefit from Melatonin.  Would avoid Ambien due to BEERS criteria -- will trial Belsomra 5 MG nightly, educated her on this medication including side effects and use.  Return in 6 weeks.  Consider sleep study if ongoing, she did not attend last time a referral was placed.

## 2023-02-02 NOTE — Assessment & Plan Note (Signed)
Chronic, ongoing, followed by endo.  A1c 9.7% in March 2024 with endo and urine ALB 30 in August 2023. Continue current regimen at this time and continue collaboration with endo.   CMP today.  She was unable to leave urine specimen for albumin check.   - Vaccinations up to date. - Statin and ACE on board, although may need to hold ACE if BP trends down. - Foot and Eye exam up to date.

## 2023-02-02 NOTE — Progress Notes (Signed)
BP 105/73   Pulse 94   Temp 97.8 F (36.6 C) (Oral)   Ht 5' 6.14" (1.68 m)   Wt 163 lb 9.6 oz (74.2 kg)   LMP  (LMP Unknown)   SpO2 100%   BMI 26.29 kg/m    Subjective:    Patient ID: Jessica Montoya, female    DOB: 1951-04-25, 72 y.o.   MRN: [id number]  HPI: Jessica Montoya is a 72 y.o. female  Chief Complaint  Patient presents with   Medication Refill   Diabetes   Hypertension   Hyperlipidemia   Chronic Kidney Disease   Lost to follow-up, last seen in office 11/04/21.  DIABETES Followed by Dr. Tedd Sias, last saw on 12/13/22 -- A1c was 9.7% -- she had been sick prior to this. Taking Humulin R U-500 takes units based on what blood sugar is -- often takes twice a day -- anywhere from 25-31 units. Continues on Trulicity 3 MG weekly.  Uses Libre.  Has tried Metformin (GI upset).  Continues on B12 daily for past low levels. Hypoglycemic episodes: occasionally, last night 53 -- got back up to 70 something and then 15 minutes later dropped again Polydipsia/polyuria: no Visual disturbance: no Chest pain: no Paresthesias: no Glucose Monitoring: yes             Accucheck frequency: TID with Libre             Fasting glucose: 140 on average in morning             Post prandial:              Evening: 140-150 often             Before meals:  Taking Insulin?: yes             Long acting insulin: as noted above             Short acting insulin: Blood Pressure Monitoring: rarely Retinal Examination: Up to Date -- [facility] -- getting shots to left eye once a month, just started and had 1st one Foot Exam: Up to Date Pneumovax: Up to Date Influenza: Refused Aspirin: yes    HYPERTENSION / HYPERLIPIDEMIA Continues Simvastatin.  Her blood pressures had been running high recently so she restarted her Ramipril 2.5 MG daily.  Followed with cardiology in past, last visit was 04/03/2020. Satisfied with current treatment? yes Duration of hypertension: chronic BP monitoring  frequency: weekly BP range: occasionally >130/80 at home BP medication side effects: no Duration of hyperlipidemia: chronic Cholesterol medication side effects: no Cholesterol supplements: none Medication compliance: good compliance Aspirin: no Recent stressors: no Recurrent headaches: no Visual changes: no Palpitations: no Dyspnea: no Chest pain: no Lower extremity edema: no Dizzy/lightheaded: occasionally  GERD Taking Omeprazole 20 MG BID. Last saw GI 02/20/2020 due to dysphagia issues.  History of colon cancer in 2013.  She reports current regimen is not offering benefit, having lots of reflux and belching -- this is worse at night. GERD control status: uncontrolled Satisfied with current treatment? yes Heartburn frequency: in afternoon often Medication side effects: no  Medication compliance: fluctuating Previous GERD medications: TUMS -- 4 times a week Dysphagia: no Odynophagia:  no Hematemesis: no Blood in stool: no EGD: yes   OSTEOPENIA Had DEXA on 02/02/21 last and has one scheduled with endo coming up.  Continues on Vitamin D supplement daily. Satisfied with current treatment?: yes Past osteoporosis medications/treatments: none Adequate calcium & vitamin D: yes Intolerance  to bisphosphonates: none Weight bearing exercises: yes   DEPRESSION Continues on Prozac 40 MG.  Taking Melatonin, but not fully benefiting.  Tried increasing Trazodone to 100 MG in past, made her too sleepy.  Lower doses do not work.  Has never had a sleep study.  States she has been having worsening lower back pain, last week she got one piece of furniture left cleaning and her back hurt so bad she cried.  She reports this as all along lower back.  At worst pain is a 6/10, intermittent -- eases up after she lies down or takes Tylenol.  Has had back pain for 25 years, she used to go to chiropractor but stopped as last time she went it messed her up.  Taking Amitriptyline for RLS at night ordered by  Dr. Tedd Sias with endo.   Mood status: stable Satisfied with current treatment?: yes Symptom severity: mild  Duration of current treatment : chronic Side effects: no Medication compliance: good compliance Psychotherapy/counseling: none Depressed mood:  occasional Anxious mood: a little bit Anhedonia: yes Significant weight loss or gain: no Insomnia: yes hard to fall asleep Fatigue: yes Feelings of worthlessness or guilt: no Impaired concentration/indecisiveness: no Suicidal ideations: no Hopelessness: no Crying spells: once and awhile    02/02/2023    1:21 PM 04/27/2022   10:18 AM 11/04/2021    3:12 PM 07/31/2021    1:48 PM 05/15/2021    3:58 PM  Depression screen PHQ 2/9  Decreased Interest 2 2 0 0 2  Down, Depressed, Hopeless 1 0 0 0 1  PHQ - 2 Score 3 2 0 0 3  Altered sleeping 3 3 2  0 3  Tired, decreased energy 3 3 3  0 3  Change in appetite 2 0 1 0 2  Feeling bad or failure about yourself  1 0 0 0 3  Trouble concentrating 1 0 0 0 2  Moving slowly or fidgety/restless 1 0 0 0 2  Suicidal thoughts 0 0 0 0 1  PHQ-9 Score 14 8 6  0 19  Difficult doing work/chores Very difficult   Not difficult at all Somewhat difficult      02/02/2023    1:22 PM 11/04/2021    3:13 PM 07/31/2021    1:48 PM 01/28/2020    3:41 PM  GAD 7 : Generalized Anxiety Score  Nervous, Anxious, on Edge 1 1 0 1  Control/stop worrying 2 1 0 1  Worry too much - different things 2 1 0 1  Trouble relaxing 1 1 0 1  Restless 2 0 0 1  Easily annoyed or irritable 2 1 0 1  Afraid - awful might happen 1 1 0 1  Total GAD 7 Score 11 6 0 7  Anxiety Difficulty Very difficult Somewhat difficult Not difficult at all Not difficult at all   Relevant past medical, surgical, family and social history reviewed and updated as indicated. Interim medical history since our last visit reviewed. Allergies and medications reviewed and updated.  Review of Systems  Constitutional:  Negative for activity change, appetite change,  diaphoresis, fatigue and fever.  Respiratory:  Negative for cough, chest tightness and shortness of breath.   Cardiovascular:  Negative for chest pain, palpitations and leg swelling.  Gastrointestinal: Negative.   Endocrine: Negative for cold intolerance, heat intolerance, polydipsia, polyphagia and polyuria.  Neurological: Negative.   Psychiatric/Behavioral:  Negative for decreased concentration, self-injury, sleep disturbance and suicidal ideas. The patient is not nervous/anxious.     Per HPI  unless specifically indicated above    Objective:    BP 105/73   Pulse 94   Temp 97.8 F (36.6 C) (Oral)   Ht 5' 6.14" (1.68 m)   Wt 163 lb 9.6 oz (74.2 kg)   LMP  (LMP Unknown)   SpO2 100%   BMI 26.29 kg/m   Wt Readings from Last 3 Encounters:  02/02/23 163 lb 9.6 oz (74.2 kg)  11/04/21 148 lb 12.8 oz (67.5 kg)  07/31/21 149 lb 6.4 oz (67.8 kg)    Physical Exam Vitals and nursing note reviewed.  Constitutional:      General: She is awake. She is not in acute distress.    Appearance: She is well-developed and well-groomed. She is not ill-appearing.  HENT:     Head: Normocephalic.     Right Ear: Hearing, tympanic membrane, ear canal and external ear normal.     Left Ear: Hearing, tympanic membrane, ear canal and external ear normal.  Eyes:     General: Lids are normal.        Right eye: No discharge.        Left eye: No discharge.     Conjunctiva/sclera: Conjunctivae normal.     Pupils: Pupils are equal, round, and reactive to light.  Neck:     Thyroid: No thyromegaly.     Vascular: No carotid bruit.  Cardiovascular:     Rate and Rhythm: Normal rate and regular rhythm.     Heart sounds: Normal heart sounds. No murmur heard.    No gallop.  Pulmonary:     Effort: Pulmonary effort is normal. No accessory muscle usage or respiratory distress.     Breath sounds: Normal breath sounds.  Abdominal:     General: Bowel sounds are normal.     Palpations: Abdomen is soft.      Tenderness: There is no abdominal tenderness.  Musculoskeletal:     Cervical back: Normal range of motion and neck supple.     Right lower leg: No edema.     Left lower leg: No edema.  Skin:    General: Skin is warm and dry.  Neurological:     Mental Status: She is alert and oriented to person, place, and time.     Coordination: Coordination is intact.     Gait: Gait is intact.     Deep Tendon Reflexes: Reflexes are normal and symmetric.     Reflex Scores:      Brachioradialis reflexes are 2+ on the right side and 2+ on the left side.      Patellar reflexes are 2+ on the right side and 2+ on the left side.    Comments: Uses cane to assist gait.  Psychiatric:        Attention and Perception: Attention normal.        Mood and Affect: Mood normal.        Speech: Speech normal.        Behavior: Behavior normal. Behavior is cooperative.    Diabetic Foot Exam - Simple   Simple Foot Form Visual Inspection No deformities, no ulcerations, no other skin breakdown bilaterally: Yes Sensation Testing Intact to touch and monofilament testing bilaterally: Yes Pulse Check Posterior Tibialis and Dorsalis pulse intact bilaterally: Yes Comments     Results for orders placed or performed in visit on 01/28/23  [facility] DIABETES EYE EXAM  Result Value Ref Range   [facility] Diabetic Eye Exam Retinopathy (A) No Retinopathy  Assessment & Plan:   Problem List Items Addressed This Visit       Cardiovascular and Mediastinum   Hypertension associated with diabetes (HCC)    Chronic, stable.  Has restarted medication on own due to higher readings recently -- monitor closely as had lows in past and recommend she stop if lows present again.  Had originally discontinued Altace in May 2022.  Recommend she continue to monitor BP at home daily and document + focus on DASH diet.  LABS: CMP, TSH, lipid, urine ALB today.  Unable to obtain urine, will get next visit.        Relevant Medications   simvastatin  (ZOCOR) 40 MG tablet   ramipril (ALTACE) 2.5 MG capsule   Other Relevant Orders   Comprehensive metabolic panel   Microalbumin, Urine Waived     Digestive   GERD (gastroesophageal reflux disease)    Chronic, ongoing.  Having ongoing symptoms even with BID dosing.  Will continue Omeprazole 20 MG BID and get back into GI due to his history of colon cancer.  Recommend heavy focus on diet changes.  Discussed with patient today.  Risks of PPI use were discussed with patient including bone loss, C. Diff diarrhea, pneumonia, infections, CKD, electrolyte abnormalities.  Verbalizes understanding and chooses to continue the medication.       Relevant Medications   omeprazole (PRILOSEC) 20 MG capsule   Other Relevant Orders   Ambulatory referral to Gastroenterology     Endocrine   CKD stage 3 due to type 2 diabetes mellitus (HCC)    Chronic, ongoing, followed by endo.  A1c 9.7% in March 2024 with endo and urine ALB 30 in August 2023. Continue current regimen at this time and continue collaboration with endo.   CMP today.  She was unable to leave urine specimen for albumin check.   - Vaccinations up to date. - Statin and ACE on board, although may need to hold ACE if BP trends down. - Foot and Eye exam up to date.      Relevant Medications   simvastatin (ZOCOR) 40 MG tablet   ramipril (ALTACE) 2.5 MG capsule   Other Relevant Orders   Comprehensive metabolic panel   CBC with Differential/Platelet   TSH   Microalbumin, Urine Waived   Diabetic neuropathy (HCC)    Chronic, ongoing, followed by endo.  A1c 9.7% in March 2024 with endo and urine ALB 30 in August 2023. Continue current regimen at this time and continue collaboration with endo.   CMP today.  She was unable to leave urine specimen for albumin check.  Maintain off Gabapentin due to increased dizziness with this. - Vaccinations up to date. - Statin and ACE on board, although may need to hold ACE if BP trends down. - Foot and Eye exam up  to date.      Relevant Medications   simvastatin (ZOCOR) 40 MG tablet   ramipril (ALTACE) 2.5 MG capsule   Other Relevant Orders   Comprehensive metabolic panel   Hyperlipidemia associated with type 2 diabetes mellitus (HCC)    Chronic, ongoing.  Continue current medication regimen and adjust as needed.  Obtain lipid panel today.        Relevant Medications   simvastatin (ZOCOR) 40 MG tablet   ramipril (ALTACE) 2.5 MG capsule   Other Relevant Orders   Comprehensive metabolic panel   Lipid Panel w/o Chol/HDL Ratio   Type 2 diabetes mellitus with microalbuminuria (HCC) - Primary    Chronic,  ongoing, followed by endo.  A1c 9.7% in March 2024 with endo and urine ALB 30 in August 2023. Continue current regimen at this time and continue collaboration with endo.   CMP today.  She was unable to leave urine specimen for albumin check.   - Vaccinations up to date. - Statin and ACE on board, although may need to hold ACE if BP trends down. - Foot and Eye exam up to date.      Relevant Medications   simvastatin (ZOCOR) 40 MG tablet   ramipril (ALTACE) 2.5 MG capsule   Other Relevant Orders   Microalbumin, Urine Waived     Nervous and Auditory   Chronic bilateral low back pain with bilateral sciatica    Chronic issue with waxing and waning.  Continue simple treatment at home and ongoing stretches.  Lidocaine patches sent in.  May benefit imaging in future and referral to physical therapy, she wishes to hold off on these at this time.      Relevant Medications   amitriptyline (ELAVIL) 25 MG tablet   FLUoxetine (PROZAC) 40 MG capsule   Suvorexant (BELSOMRA) 5 MG TABS     Musculoskeletal and Integument   Osteopenia    Chronic, ongoing.  Last DEXA 02/2021 at [facility] with ongoing osteopenia, mild decrease in T-score.  Continue Vitamin D supplement and recheck Vit D level today.  Collaborate with endo on changes in future if needed.      Relevant Orders   VITAMIN D 25 Hydroxy (Vit-D  Deficiency, Fractures)     Other   Depression    Chronic, ongoing. Will continue Prozac 40 MG daily and adjust regimen as needed.  Denies SI/HI.  Adjust medication as needed and monitor NA+ level.      Relevant Medications   amitriptyline (ELAVIL) 25 MG tablet   FLUoxetine (PROZAC) 40 MG capsule   History of colon cancer    History of in 2013, worsening GERD symptoms, will get back into GI for assessment.  Is maintaining weight and no red flags at this time.      Insomnia    Chronic, ongoing with poor control -- continues to have ongoing fatigue issues and poor sleep.  Did not tolerate Trazodone nor benefit from it + no benefit from Melatonin.  Would avoid Ambien due to BEERS criteria -- will trial Belsomra 5 MG nightly, educated her on this medication including side effects and use.  Return in 6 weeks.  Consider sleep study if ongoing, she did not attend last time a referral was placed.      Long-term insulin use (HCC)    Chronic, ongoing.  Continue collaboration with endocrinology.      Relevant Orders   Comprehensive metabolic panel   Vitamin B12 deficiency    Ongoing, continue daily supplement and recheck B12 level today.      Relevant Orders   Vitamin B12   Vitamin D deficiency    Ongoing, continue daily supplement and recheck Vit D level today.      Relevant Orders   VITAMIN D 25 Hydroxy (Vit-D Deficiency, Fractures)     Follow up plan: Return in about 6 weeks (around 03/16/2023) for INSOMNIA.

## 2023-02-02 NOTE — Assessment & Plan Note (Signed)
Chronic, ongoing.  Continue collaboration with endocrinology. 

## 2023-02-02 NOTE — Assessment & Plan Note (Addendum)
Chronic, stable.  Has restarted medication on own due to higher readings recently -- monitor closely as had lows in past and recommend she stop if lows present again.  Had originally discontinued Altace in May 2022.  Recommend she continue to monitor BP at home daily and document + focus on DASH diet.  LABS: CMP, TSH, lipid, urine ALB today.  Unable to obtain urine, will get next visit.

## 2023-02-03 ENCOUNTER — Telehealth: Payer: Self-pay | Admitting: *Deleted

## 2023-02-03 ENCOUNTER — Other Ambulatory Visit: Payer: Self-pay | Admitting: Nurse Practitioner

## 2023-02-03 LAB — TSH: TSH: 2.31 u[IU]/mL (ref 0.450–4.500)

## 2023-02-03 LAB — VITAMIN D 25 HYDROXY (VIT D DEFICIENCY, FRACTURES): Vit D, 25-Hydroxy: 9.3 ng/mL — ABNORMAL LOW (ref 30.0–100.0)

## 2023-02-03 LAB — CBC WITH DIFFERENTIAL/PLATELET
Basophils Absolute: 0 10*3/uL (ref 0.0–0.2)
Basos: 0 %
EOS (ABSOLUTE): 0.1 10*3/uL (ref 0.0–0.4)
Eos: 2 %
Hematocrit: 37.4 % (ref 34.0–46.6)
Hemoglobin: 12.6 g/dL (ref 11.1–15.9)
Immature Grans (Abs): 0 10*3/uL (ref 0.0–0.1)
Immature Granulocytes: 0 %
Lymphocytes Absolute: 1 10*3/uL (ref 0.7–3.1)
Lymphs: 15 %
MCH: 31.2 pg (ref 26.6–33.0)
MCHC: 33.7 g/dL (ref 31.5–35.7)
MCV: 93 fL (ref 79–97)
Monocytes Absolute: 0.3 10*3/uL (ref 0.1–0.9)
Monocytes: 4 %
Neutrophils Absolute: 5.2 10*3/uL (ref 1.4–7.0)
Neutrophils: 79 %
Platelets: 204 10*3/uL (ref 150–450)
RBC: 4.04 x10E6/uL (ref 3.77–5.28)
RDW: 12.9 % (ref 11.7–15.4)
WBC: 6.6 10*3/uL (ref 3.4–10.8)

## 2023-02-03 LAB — LIPID PANEL W/O CHOL/HDL RATIO
Cholesterol, Total: 143 mg/dL (ref 100–199)
HDL: 66 mg/dL (ref >39)
LDL Chol Calc (NIH): 53 mg/dL (ref 0–99)
Triglycerides: 141 mg/dL (ref 0–149)
VLDL Cholesterol Cal: 24 mg/dL (ref 5–40)

## 2023-02-03 LAB — COMPREHENSIVE METABOLIC PANEL
ALT: 15 IU/L (ref 0–32)
AST: 17 IU/L (ref 0–40)
Albumin/Globulin Ratio: 1.7 (ref 1.2–2.2)
Albumin: 4.6 g/dL (ref 3.8–4.8)
Alkaline Phosphatase: 64 IU/L (ref 44–121)
BUN/Creatinine Ratio: 10 — ABNORMAL LOW (ref 12–28)
BUN: 16 mg/dL (ref 8–27)
Bilirubin Total: 0.5 mg/dL (ref 0.0–1.2)
CO2: 21 mmol/L (ref 20–29)
Calcium: 9.9 mg/dL (ref 8.7–10.3)
Chloride: 98 mmol/L (ref 96–106)
Creatinine, Ser: 1.54 mg/dL — ABNORMAL HIGH (ref 0.57–1.00)
Globulin, Total: 2.7 g/dL (ref 1.5–4.5)
Glucose: 236 mg/dL — ABNORMAL HIGH (ref 70–99)
Potassium: 4.8 mmol/L (ref 3.5–5.2)
Sodium: 139 mmol/L (ref 134–144)
Total Protein: 7.3 g/dL (ref 6.0–8.5)
eGFR: 36 mL/min/{1.73_m2} — ABNORMAL LOW (ref >59)

## 2023-02-03 LAB — VITAMIN B12: Vitamin B-12: 434 pg/mL (ref 232–1245)

## 2023-02-03 MED ORDER — CHOLECALCIFEROL 1.25 MG (50000 UT) PO TABS: 1.0000 | ORAL_TABLET | ORAL | 4 refills | Status: DC

## 2023-02-03 NOTE — Telephone Encounter (Signed)
Patient is calling for results and notified: Good morning, please let Jessica Montoya know her labs have returned: - Kidney function continues to show stage 3b kidney disease with no worsening -- I recommend continue to ensure good hydration daily.  We will continue to monitor and if any reduction to stage 4 or sudden decline we will get you into kidney doctor.  Liver function is normal. - Vitamin D is very low, I am going to send in a weekly supplement for you to start taking for this and overall bone health.   - Remainder of labs are stable, however B12 is on lower end of normal.  I do recommend you taking Vitamin B12 1000 MCG daily.  Any questions? Keep being stellar!!  Thank you for allowing me to participate in your care.  I appreciate you. Kindest regards, Jessica Montoya  Patient also states the pain patch for the back was too expensive- patient part- $351.26. Is there another option?

## 2023-02-03 NOTE — Progress Notes (Signed)
Good morning, please let Caterin know her labs have returned: - Kidney function continues to show stage 3b kidney disease with no worsening -- I recommend continue to ensure good hydration daily.  We will continue to monitor and if any reduction to stage 4 or sudden decline we will get you into kidney doctor.  Liver function is normal. - Vitamin D is very low, I am going to send in a weekly supplement for you to start taking for this and overall bone health.   - Remainder of labs are stable, however B12 is on lower end of normal.  I do recommend you taking Vitamin B12 1000 MCG daily.  Any questions? Keep being stellar!!  Thank you for allowing me to participate in your care.  I appreciate you. Kindest regards, Nalaysia Manganiello

## 2023-02-04 NOTE — Telephone Encounter (Addendum)
Patient left vm with providers recommendations, and asked to return call with any questions

## 2023-02-18 DIAGNOSIS — E113312 Type 2 diabetes mellitus with moderate nonproliferative diabetic retinopathy with macular edema, left eye: Secondary | ICD-10-CM | POA: Diagnosis not present

## 2023-02-28 DIAGNOSIS — E1165 Type 2 diabetes mellitus with hyperglycemia: Secondary | ICD-10-CM | POA: Diagnosis not present

## 2023-03-01 NOTE — Progress Notes (Signed)
Celso Amy, PA-C [address]  [address]  [place], [place] [address]  Main: [phone]  Fax: [phone]   Gastroenterology Consultation  Referring Provider:     Marjie Skiff, NP Primary Care Physician:  Marjie Skiff, NP Primary Gastroenterologist:  Celso Amy, PA-C / Dr. Wyline Mood  Reason for Consultation:     GERD / Belching, Dysphagia, Hx Colon Cancer        HPI:   Jessica Montoya is a 72 y.o. y/o female referred for consultation & management  by Marjie Skiff, NP.    Previous patient of Dr. Maximino Greenland, last follow-up OV 02/2020.  Patient reports increasing acid reflux, belching, and dysphagia for several months.  Her PCP recently increased omeprazole 40 mg from once to twice daily.  She feels like solid food gets stuck in her throat on occasion.  Denies vomiting or food bolus episodes.  She has irregular bowel habits since she had partial colectomy in 2013.  Denies rectal bleeding or unintentional weight loss.  Admits to fatigue.  No recent antibiotic use or steroid inhalers.  Previous history of colon cancer diagnosed in 2013 s/p partial colectomy.  She did not require chemo or radiation.  02/2020 EGD showed esophageal candidiasis treated with fluconazole.  Multiple benign fundic gland gastric polyps, otherwise normal. Bx Negative for H. Pylori.  02/2020 colonoscopy showed prior end-to-side ileocolonic anastomosis in the ascending colon with healthy mucosa.  15 mm polyp at 70 cm proximal to the anus removed (Sessile serrated polyp with low grade dysplasia).  5 mm tubular adenoma anal polyp removed.  Two 5 mm tubular adenoma polyps removed from descending colon.  10 mm inflammatory polyp removed from sigmoid colon.  Nodular mucosa in the rectum and biopsies showed benign mucosa with hyperplastic changes.  1 year repeat colonoscopy was recommended (overdue).    Past Medical History:  Diagnosis Date   Anxiety    Arthritis    knees   Chronic pain     CKD (chronic kidney disease), stage II    Depression    Diabetes mellitus without complication (HCC)    Diabetic neuropathy (HCC)    Fatigue    a. 05/2018 Echo: EF 60-65%, no rwma, nl RV fxn; b. 05/2018 Lexiscan MV: EF>65%. No ischemia/infarct.   Hyperlipidemia    Hypertension    Insomnia    Long term current use of insulin (HCC)    Peripheral neuropathy    Vertigo    2x/month   Wears dentures    full upper and lower    Past Surgical History:  Procedure Laterality Date   ABDOMINAL HYSTERECTOMY     CATARACT EXTRACTION W/PHACO Left 02/20/2019   Procedure: CATARACT EXTRACTION PHACO AND INTRAOCULAR LENS PLACEMENT (IOC) LEFT DIABETES;  Surgeon: Galen Manila, MD;  Location: [facility];  Service: Ophthalmology;  Laterality: Left;   CATARACT EXTRACTION W/PHACO Right 03/27/2019   Procedure: CATARACT EXTRACTION PHACO AND INTRAOCULAR LENS PLACEMENT (IOC)  RIGHT DIABETIC;  Surgeon: Galen Manila, MD;  Location: [facility];  Service: Ophthalmology;  Laterality: Right;  Diabetic - insulin   COLON SURGERY     COLONOSCOPY WITH PROPOFOL N/A 02/05/2020   Procedure: COLONOSCOPY WITH PROPOFOL;  Surgeon: Pasty Spillers, MD;  Location: [facility];  Service: Endoscopy;  Laterality: N/A;   ESOPHAGOGASTRODUODENOSCOPY (EGD) WITH PROPOFOL N/A 02/05/2020   Procedure: ESOPHAGOGASTRODUODENOSCOPY (EGD) WITH PROPOFOL;  Surgeon: Pasty Spillers, MD;  Location: [facility];  Service: Endoscopy;  Laterality: N/A;  Prior to Admission medications   Medication Sig Start Date End Date Taking? Authorizing Provider  Cholecalciferol 1.25 MG (50000 UT) TABS Take 1 tablet by mouth once a week. 02/03/23   [name], Corrie Dandy T, NP  amitriptyline (ELAVIL) 25 MG tablet Take 1 tablet by mouth at bedtime. 12/13/22 12/13/23  [provider]  aspirin 81 MG tablet Take 81 mg by mouth daily.    [provider]  Dulaglutide (TRULICITY Maybee) Inject 1.5 mg into the skin once a week.     [provider]  ferrous fumarate (HEMOCYTE - 106 MG FE) 325 (106 FE) MG TABS tablet Take 1 tablet by mouth daily.    [provider]  FLUoxetine (PROZAC) 40 MG capsule TAKE 1 CAPSULE BY MOUTH  DAILY 02/02/23   [name], Jolene T, NP  insulin regular human CONCENTRATED (HUMULIN R) 500 UNIT/ML kwikpen Inject 40 units at 8 AM. Inject 40 units at 2 PM. Inject 20 units at 8 PM. 03/10/20   [provider]  lidocaine (LIDODERM) 5 % Place 1 patch onto the skin daily. Remove & Discard patch within 12 hours or as directed by MD 02/02/23   Aura Dials T, NP  Multiple Vitamin (MULTIVITAMIN) tablet Take 1 tablet by mouth daily.    [provider]  omeprazole (PRILOSEC) 20 MG capsule TAKE 1 CAPSULE BY MOUTH TWICE  DAILY BEFORE A MEAL 02/02/23   [name], Jolene T, NP  ramipril (ALTACE) 2.5 MG capsule Take 1 capsule (2.5 mg total) by mouth daily. 02/02/23   [name], Corrie Dandy T, NP  simvastatin (ZOCOR) 40 MG tablet Take 1 tablet (40 mg total) by mouth daily. 02/02/23   [name], Corrie Dandy T, NP  Suvorexant (BELSOMRA) 5 MG TABS Take 1 tablet (5 mg total) by mouth at bedtime as needed. 02/02/23   [name], Corrie Dandy T, NP  vitamin B-12 (CYANOCOBALAMIN) 1000 MCG tablet Take 2,000 mcg by mouth daily.    [provider]    Family History  Problem Relation Age of Onset   Cancer Mother        melanoma   Breast cancer Mother 43   Cancer Father        pancreatic   Hypertension Maternal Grandmother    Cancer Maternal Grandmother        colon   Cancer Paternal Grandmother        stomach   Stroke Paternal Grandfather    Cancer Maternal Grandfather        kidney     Social History   Tobacco Use   Smoking status: Never   Smokeless tobacco: Never  Vaping Use   Vaping Use: Never used  Substance Use Topics   Alcohol use: No    Alcohol/week: 0.0 standard drinks of alcohol   Drug use: No    Allergies as of 03/02/2023 - Review Complete 02/02/2023  Allergen Reaction Noted   Compazine  [prochlorperazine edisylate] Rash 05/21/2015   Exenatide Nausea Only 02/04/2016   Insulin aspart Nausea Only 05/21/2015    Review of Systems:    All systems reviewed and negative except where noted in HPI.   Physical Exam:  LMP  (LMP Unknown)  No LMP recorded (lmp unknown). Patient has had a hysterectomy. Psych:  Alert and cooperative. Normal mood and affect. General:   Alert,  Well-developed, well-nourished, pleasant and cooperative in NAD Head:  Normocephalic and atraumatic. Eyes:  Sclera clear, no icterus.   Conjunctiva pink. Neck:  Supple; no masses or thyromegaly. Lungs:  Respirations even and  unlabored.  Clear throughout to auscultation.   No wheezes, crackles, or rhonchi. No acute distress. Heart:  Regular rate and rhythm; no murmurs, clicks, rubs, or gallops. Abdomen:  Normal bowel sounds.  No bruits.  Soft, and non-distended without masses, hepatosplenomegaly or hernias noted.  No Tenderness.  No guarding or rebound tenderness.    Neurologic:  Alert and oriented x3;  grossly normal neurologically.  Walks with a cane. Psych:  Alert and cooperative. Normal mood and affect.  Imaging Studies: No results found.  Assessment and Plan:   Jessica Montoya is a 72 y.o. y/o female has been referred for Multiple GI issues:  Dysphagia - Evaluate for Recurrent Esophageal Candida or stricture.  GERD / Belching Continue Omeprazole 40mg  BID. Add Pepcid AC 20mg  BID if needed. Recommend Lifestyle Modifications to prevent Acid Reflux.  Rec. Avoid coffee, sodas, peppermint, citrus fruits, and spicey foods.  Avoid eating 2-3 hours before bedtime.   3.   Hx of Esophageal Candidiasis   Scheduling EGD I discussed risks of EGD with patient to include risk of bleeding, perforation, and risk of sedation.   Patient expressed understanding and agrees to proceed with EGD.   4.   History of Colon Cancer - Dx 2013 s/p partial colectomy.  5.   History of Adenomatous Colon polyps with low grade  dysplasia  Scheduling Colonoscopy I discussed risks of colonoscopy with patient to include risk of bleeding, colon perforation, and risk of sedation.  Patient expressed understanding and agrees to proceed with colonoscopy.   Follow up in 4 weeks after EGD / Colon with TG.  Celso Amy, PA-C

## 2023-03-02 ENCOUNTER — Other Ambulatory Visit: Payer: Self-pay

## 2023-03-02 ENCOUNTER — Encounter: Payer: Self-pay | Admitting: Physician Assistant

## 2023-03-02 ENCOUNTER — Ambulatory Visit: Payer: Medicare Other | Admitting: Physician Assistant

## 2023-03-02 VITALS — BP 98/60 | HR 96 | Temp 98.1°F | Ht 66.0 in | Wt 168.2 lb

## 2023-03-02 DIAGNOSIS — R142 Eructation: Secondary | ICD-10-CM

## 2023-03-02 DIAGNOSIS — Z85038 Personal history of other malignant neoplasm of large intestine: Secondary | ICD-10-CM | POA: Diagnosis not present

## 2023-03-02 DIAGNOSIS — Z86010 Personal history of colonic polyps: Secondary | ICD-10-CM

## 2023-03-02 DIAGNOSIS — R1310 Dysphagia, unspecified: Secondary | ICD-10-CM | POA: Diagnosis not present

## 2023-03-02 DIAGNOSIS — K219 Gastro-esophageal reflux disease without esophagitis: Secondary | ICD-10-CM

## 2023-03-02 MED ORDER — PEG 3350-KCL-NABCB-NACL-NASULF 236 G PO SOLR: 4000.0000 mL | Freq: Once | ORAL | 0 refills | Status: AC

## 2023-03-16 ENCOUNTER — Ambulatory Visit: Payer: Medicare Other | Admitting: Nurse Practitioner

## 2023-03-16 DIAGNOSIS — E1129 Type 2 diabetes mellitus with other diabetic kidney complication: Secondary | ICD-10-CM

## 2023-03-18 DIAGNOSIS — E113312 Type 2 diabetes mellitus with moderate nonproliferative diabetic retinopathy with macular edema, left eye: Secondary | ICD-10-CM | POA: Diagnosis not present

## 2023-03-30 DIAGNOSIS — E1165 Type 2 diabetes mellitus with hyperglycemia: Secondary | ICD-10-CM | POA: Diagnosis not present

## 2023-04-15 DIAGNOSIS — E113312 Type 2 diabetes mellitus with moderate nonproliferative diabetic retinopathy with macular edema, left eye: Secondary | ICD-10-CM | POA: Diagnosis not present

## 2023-04-28 ENCOUNTER — Telehealth: Payer: Self-pay | Admitting: Gastroenterology

## 2023-04-28 NOTE — Telephone Encounter (Signed)
Pt left message to cancel procedure for 05/03/2023 please return call

## 2023-04-28 NOTE — Telephone Encounter (Signed)
fyi

## 2023-04-28 NOTE — Telephone Encounter (Signed)
Called patient to let her know that her procedure had been cancelled with the endoscopy unit. I spoke to Dava from the endo unit to cancel patient's procedures per patient's request.

## 2023-04-29 DIAGNOSIS — E1165 Type 2 diabetes mellitus with hyperglycemia: Secondary | ICD-10-CM | POA: Diagnosis not present

## 2023-05-03 ENCOUNTER — Ambulatory Visit: Admit: 2023-05-03 | Payer: Medicare Other | Admitting: Gastroenterology

## 2023-05-03 ENCOUNTER — Encounter: Admission: RE | Payer: Self-pay | Source: Home / Self Care

## 2023-05-03 SURGERY — ESOPHAGOGASTRODUODENOSCOPY (EGD) WITH PROPOFOL
Anesthesia: General

## 2023-05-04 ENCOUNTER — Telehealth: Payer: Self-pay | Admitting: Nurse Practitioner

## 2023-05-04 NOTE — Telephone Encounter (Signed)
Copied from CRM [id number]. Topic: Medicare AWV >> May 04, 2023  1:34 PM Payton Doughty wrote: Reason for CRM: LM 05/03/2023 to schedule AWV   Verlee Rossetti; Care Guide Ambulatory Clinical Support [place] [facility] Direct Dial: [phone]

## 2023-05-10 DIAGNOSIS — N1832 Chronic kidney disease, stage 3b: Secondary | ICD-10-CM | POA: Diagnosis not present

## 2023-05-10 DIAGNOSIS — Z794 Long term (current) use of insulin: Secondary | ICD-10-CM | POA: Diagnosis not present

## 2023-05-10 DIAGNOSIS — M8588 Other specified disorders of bone density and structure, other site: Secondary | ICD-10-CM | POA: Diagnosis not present

## 2023-05-10 DIAGNOSIS — E1142 Type 2 diabetes mellitus with diabetic polyneuropathy: Secondary | ICD-10-CM | POA: Diagnosis not present

## 2023-05-10 DIAGNOSIS — E1122 Type 2 diabetes mellitus with diabetic chronic kidney disease: Secondary | ICD-10-CM | POA: Diagnosis not present

## 2023-05-10 LAB — PROTEIN / CREATININE RATIO, URINE
Albumin, U: 37
Creatinine, Urine: 58.6

## 2023-05-10 LAB — MICROALBUMIN / CREATININE URINE RATIO: Microalb Creat Ratio: 63.1

## 2023-05-13 DIAGNOSIS — E113312 Type 2 diabetes mellitus with moderate nonproliferative diabetic retinopathy with macular edema, left eye: Secondary | ICD-10-CM | POA: Diagnosis not present

## 2023-05-17 DIAGNOSIS — M8580 Other specified disorders of bone density and structure, unspecified site: Secondary | ICD-10-CM | POA: Diagnosis not present

## 2023-05-17 DIAGNOSIS — N1832 Chronic kidney disease, stage 3b: Secondary | ICD-10-CM | POA: Diagnosis not present

## 2023-05-17 DIAGNOSIS — E1142 Type 2 diabetes mellitus with diabetic polyneuropathy: Secondary | ICD-10-CM | POA: Diagnosis not present

## 2023-05-17 DIAGNOSIS — E1165 Type 2 diabetes mellitus with hyperglycemia: Secondary | ICD-10-CM | POA: Diagnosis not present

## 2023-05-17 DIAGNOSIS — E1122 Type 2 diabetes mellitus with diabetic chronic kidney disease: Secondary | ICD-10-CM | POA: Diagnosis not present

## 2023-05-17 DIAGNOSIS — E1129 Type 2 diabetes mellitus with other diabetic kidney complication: Secondary | ICD-10-CM | POA: Diagnosis not present

## 2023-05-17 DIAGNOSIS — R809 Proteinuria, unspecified: Secondary | ICD-10-CM | POA: Diagnosis not present

## 2023-05-17 DIAGNOSIS — Z794 Long term (current) use of insulin: Secondary | ICD-10-CM | POA: Diagnosis not present

## 2023-05-29 DIAGNOSIS — E1165 Type 2 diabetes mellitus with hyperglycemia: Secondary | ICD-10-CM | POA: Diagnosis not present

## 2023-06-01 ENCOUNTER — Ambulatory Visit: Payer: Medicare Other | Admitting: Physician Assistant

## 2023-06-10 DIAGNOSIS — E1165 Type 2 diabetes mellitus with hyperglycemia: Secondary | ICD-10-CM | POA: Diagnosis not present

## 2023-06-30 DIAGNOSIS — E113312 Type 2 diabetes mellitus with moderate nonproliferative diabetic retinopathy with macular edema, left eye: Secondary | ICD-10-CM | POA: Diagnosis not present

## 2023-07-28 ENCOUNTER — Encounter: Payer: Self-pay | Admitting: Nurse Practitioner

## 2023-08-05 DIAGNOSIS — E113312 Type 2 diabetes mellitus with moderate nonproliferative diabetic retinopathy with macular edema, left eye: Secondary | ICD-10-CM | POA: Diagnosis not present

## 2023-08-22 DIAGNOSIS — E1122 Type 2 diabetes mellitus with diabetic chronic kidney disease: Secondary | ICD-10-CM | POA: Diagnosis not present

## 2023-08-22 DIAGNOSIS — M8580 Other specified disorders of bone density and structure, unspecified site: Secondary | ICD-10-CM | POA: Diagnosis not present

## 2023-08-22 DIAGNOSIS — E1165 Type 2 diabetes mellitus with hyperglycemia: Secondary | ICD-10-CM | POA: Diagnosis not present

## 2023-08-22 DIAGNOSIS — N1832 Chronic kidney disease, stage 3b: Secondary | ICD-10-CM | POA: Diagnosis not present

## 2023-08-22 DIAGNOSIS — E1129 Type 2 diabetes mellitus with other diabetic kidney complication: Secondary | ICD-10-CM | POA: Diagnosis not present

## 2023-08-22 DIAGNOSIS — R809 Proteinuria, unspecified: Secondary | ICD-10-CM | POA: Diagnosis not present

## 2023-08-22 DIAGNOSIS — Z794 Long term (current) use of insulin: Secondary | ICD-10-CM | POA: Diagnosis not present

## 2023-08-22 DIAGNOSIS — E1142 Type 2 diabetes mellitus with diabetic polyneuropathy: Secondary | ICD-10-CM | POA: Diagnosis not present

## 2023-08-29 DIAGNOSIS — E1165 Type 2 diabetes mellitus with hyperglycemia: Secondary | ICD-10-CM | POA: Diagnosis not present

## 2023-09-21 DIAGNOSIS — E113312 Type 2 diabetes mellitus with moderate nonproliferative diabetic retinopathy with macular edema, left eye: Secondary | ICD-10-CM | POA: Diagnosis not present

## 2023-10-31 DIAGNOSIS — E113312 Type 2 diabetes mellitus with moderate nonproliferative diabetic retinopathy with macular edema, left eye: Secondary | ICD-10-CM | POA: Diagnosis not present

## 2023-11-21 ENCOUNTER — Telehealth: Payer: Self-pay

## 2023-11-21 NOTE — Telephone Encounter (Signed)
Patient was identified as falling into the [place] Measure - Diabetes.   Patient was: Appointment scheduled with primary care provider in the next 30 days.  Patient is managed by [facility].

## 2023-11-26 LAB — HEMOGLOBIN A1C: A1c: 8.2

## 2023-11-26 NOTE — Patient Instructions (Signed)

## 2023-11-29 ENCOUNTER — Ambulatory Visit (INDEPENDENT_AMBULATORY_CARE_PROVIDER_SITE_OTHER): Payer: Medicare Other | Admitting: Nurse Practitioner

## 2023-11-29 ENCOUNTER — Encounter: Payer: Self-pay | Admitting: Nurse Practitioner

## 2023-11-29 VITALS — BP 104/68 | HR 102 | Temp 97.5°F | Ht 66.0 in | Wt 167.6 lb

## 2023-11-29 DIAGNOSIS — M85851 Other specified disorders of bone density and structure, right thigh: Secondary | ICD-10-CM

## 2023-11-29 DIAGNOSIS — F5101 Primary insomnia: Secondary | ICD-10-CM

## 2023-11-29 DIAGNOSIS — I152 Hypertension secondary to endocrine disorders: Secondary | ICD-10-CM | POA: Diagnosis not present

## 2023-11-29 DIAGNOSIS — E1122 Type 2 diabetes mellitus with diabetic chronic kidney disease: Secondary | ICD-10-CM | POA: Diagnosis not present

## 2023-11-29 DIAGNOSIS — M5442 Lumbago with sciatica, left side: Secondary | ICD-10-CM

## 2023-11-29 DIAGNOSIS — G8929 Other chronic pain: Secondary | ICD-10-CM | POA: Insufficient documentation

## 2023-11-29 DIAGNOSIS — E1142 Type 2 diabetes mellitus with diabetic polyneuropathy: Secondary | ICD-10-CM

## 2023-11-29 DIAGNOSIS — R1084 Generalized abdominal pain: Secondary | ICD-10-CM | POA: Insufficient documentation

## 2023-11-29 DIAGNOSIS — F331 Major depressive disorder, recurrent, moderate: Secondary | ICD-10-CM

## 2023-11-29 DIAGNOSIS — E1159 Type 2 diabetes mellitus with other circulatory complications: Secondary | ICD-10-CM

## 2023-11-29 DIAGNOSIS — E1169 Type 2 diabetes mellitus with other specified complication: Secondary | ICD-10-CM

## 2023-11-29 DIAGNOSIS — Z794 Long term (current) use of insulin: Secondary | ICD-10-CM | POA: Diagnosis not present

## 2023-11-29 DIAGNOSIS — E559 Vitamin D deficiency, unspecified: Secondary | ICD-10-CM

## 2023-11-29 DIAGNOSIS — N183 Chronic kidney disease, stage 3 unspecified: Secondary | ICD-10-CM | POA: Diagnosis not present

## 2023-11-29 DIAGNOSIS — E538 Deficiency of other specified B group vitamins: Secondary | ICD-10-CM | POA: Diagnosis not present

## 2023-11-29 DIAGNOSIS — E785 Hyperlipidemia, unspecified: Secondary | ICD-10-CM | POA: Diagnosis not present

## 2023-11-29 MED ORDER — HYDROCODONE-ACETAMINOPHEN 5-325 MG PO TABS: 1.0000 | ORAL_TABLET | Freq: Four times a day (QID) | ORAL | 0 refills | Status: AC | PRN

## 2023-11-29 NOTE — Assessment & Plan Note (Signed)
Ongoing, continue daily supplement and recheck Vit D level today. 

## 2023-11-29 NOTE — Assessment & Plan Note (Signed)
 Chronic, stable.  Maintain off Ramipril for now due to lower BP readings.  Recommend she continue to monitor BP at home daily and document + focus on DASH diet.  LABS: CMP, TSH, lipid, urine ALB today.  May need to consider Midodrine in future if low BP continues and falls.

## 2023-11-29 NOTE — Assessment & Plan Note (Signed)
 Chronic issue for years that is worsening.  No benefit from OTC medications currently, nor from PT or injections in past.  Will obtain new imaging, cervical and lumbar spine.  Five days or Norco sent in to help with pain, discussed need to use this only if needed for severe pain.  She requests referral to neurosurgery, Dr. Marcell Barlow, will place this.

## 2023-11-29 NOTE — Assessment & Plan Note (Signed)
 Chronic, ongoing, followed by endo.  A1c 8.2% in November with endo and urine ALB 30 in 2024, recheck urine today. Continue current regimen at this time and continue collaboration with endo.   CMP today.  Gabapentin caused dizziness.  Continue Amitriptyline daily. - Vaccinations up to date. - Statin on board, ACE on hold due to low BP level. - Foot and Eye exam up to date.

## 2023-11-29 NOTE — Assessment & Plan Note (Signed)
 Chronic, ongoing with poor control -- continues to have ongoing fatigue issues and poor sleep.  Did not tolerate Trazodone nor benefit from it + no benefit from Melatonin.  Continue Amitriptyline at this time.  Suspect pain is causing poor sleep. Consider sleep study if ongoing, she did not attend last time a referral was placed.

## 2023-11-29 NOTE — Progress Notes (Signed)
 BP 104/68 (BP Location: Left Arm, Patient Position: Sitting, Cuff Size: Normal)   Pulse (!) 102   Temp (!) 97.5 F (36.4 C) (Oral)   Ht 5\' 6"  (1.676 m)   Wt 167 lb 9.6 oz (76 kg)   LMP  (LMP Unknown)   SpO2 95%   BMI 27.05 kg/m    Subjective:    Patient ID: Jessica Montoya, female    DOB: 1951/02/14, 73 y.o.   MRN: [id number]  HPI: Jessica Montoya is a 73 y.o. female  Chief Complaint  Patient presents with   Diabetes   Fatigue    Patient said she has been having weakness in her back, neck and legs, she keep falling down. It's been going on for years and just getting worse    Last follow-up in May 2024, follows with multiple specialists.  DIABETES Follows with Dr. Tedd Sias, last visit 08/22/23, A1c was 8.2%. Currently taking Basaglar, Humalog, Trulicity.  Previously took U 500 and Marcelline Deist (could not take as $300 a month).  Is taking Metformin 1000 MG at night only -- in past caused GI issues. Continues on B12 daily for past low levels.  Has been having some abdominal pain over past 2 to 2 1/2 months, last saw GI on 03/02/23.  Like a grabbing pain, lasts 15 minutes and goes away on own.  To right and left side, right side is area of past surgery.  History of colon cancer.  Last colonoscopy 2021. Fatigue and reduced appetite with this.  Hypoglycemic episodes: occasionally, last night 52 -- varies high to low Polydipsia/polyuria: no Visual disturbance: no Chest pain: no Paresthesias: no Glucose Monitoring: yes             Accucheck frequency: TID with Libre             Fasting glucose: 160 to 170 range             Post prandial:              Evening: low 200's             Before meals:  Taking Insulin?: yes             Long acting insulin: 20 units             Short acting insulin: 16 units before breakfast and dinner Blood Pressure Monitoring: rarely Retinal Examination: Up to Date -- [facility] -- getting shots to left eye once a month, just started and had 1st  one Foot Exam: Up to Date Pneumovax: Up to Date Influenza: Refused Aspirin: yes    HYPERTENSION / HYPERLIPIDEMIA Continues Simvastatin.  Took Ramipril 2.5 MG daily in past, this was stopped due to lower BP and ongoing intermittent falls.  Follows with cardiology, but no recent visits, last visit was 04/03/2020. Satisfied with current treatment? yes Duration of hypertension: chronic BP monitoring frequency: weekly BP range: 100/60 range BP medication side effects: no Duration of hyperlipidemia: chronic Cholesterol medication side effects: no Cholesterol supplements: none Medication compliance: good compliance Aspirin: no Recent stressors: no Recurrent headaches: no Visual changes: no Palpitations: no Dyspnea: no Chest pain: no Lower extremity edema: no Dizzy/lightheaded: occasionally  OSTEOPENIA Continues on Vitamin D supplement daily. Follows with Dr. Tedd Sias. Satisfied with current treatment?: yes Past osteoporosis medications/treatments: none Adequate calcium & vitamin D: yes Intolerance to bisphosphonates: none Weight bearing exercises: yes  BACK AND NECK PAIN Ongoing lower back and neck pain for years.  Last MRI  2014 lower back noted arthritis and small disc protrusions.  Is high fall risk, last fall was 2 weeks ago -- trying to get into house and fell, scratched back up. Duration:  chronic Mechanism of injury: became worse after falling down escalator Location: bilateral and low back and neck both sides Onset: gradual Severity: 9/10 Quality: sharp, dull, aching, and throbbing Frequency: constant, but intermittent worsening with ADLs Radiation: none Aggravating factors: lifting, movement, and bending Alleviating factors: rest and APAP -- eases it up a little bit Status: worse Treatments attempted: rest, ice, heat, APAP, ibuprofen, and physical therapy  Relief with NSAIDs?: no Nighttime pain:  yes Paresthesias / decreased sensation:   neuropathy at baseline Bowel /  bladder incontinence:   at baseline Fevers:  no Dysuria / urinary frequency:  no   DEPRESSION Taking Prozac 40 MG. Taking Melatonin, but not fully benefiting.  Tried increasing Trazodone to 100 MG in past, made her too sleepy.  Lower doses do not work.  Has never had a sleep study.  Taking Amitriptyline for RLS at night ordered by Dr. Tedd Sias with endo.   Mood status: stable Satisfied with current treatment?: yes Symptom severity: mild  Duration of current treatment : chronic Side effects: no Medication compliance: good compliance Psychotherapy/counseling: none Depressed mood: occasional Anxious mood: occasional Anhedonia: yes Significant weight loss or gain: no Insomnia: yes hard to fall asleep  Fatigue: yes Feelings of worthlessness or guilt: no Impaired concentration/indecisiveness: no Suicidal ideations: no Hopelessness: no Crying spells: occasional    11/29/2023    2:38 PM 02/02/2023    1:21 PM 04/27/2022   10:18 AM 11/04/2021    3:12 PM 07/31/2021    1:48 PM  Depression screen PHQ 2/9  Decreased Interest 3 2 2  0 0  Down, Depressed, Hopeless 2 1 0 0 0  PHQ - 2 Score 5 3 2  0 0  Altered sleeping 3 3 3 2  0  Tired, decreased energy 3 3 3 3  0  Change in appetite 3 2 0 1 0  Feeling bad or failure about yourself  2 1 0 0 0  Trouble concentrating 1 1 0 0 0  Moving slowly or fidgety/restless 2 1 0 0 0  Suicidal thoughts 0 0 0 0 0  PHQ-9 Score 19 14 8 6  0  Difficult doing work/chores Very difficult Very difficult   Not difficult at all      11/29/2023    2:38 PM 02/02/2023    1:22 PM 11/04/2021    3:13 PM 07/31/2021    1:48 PM  GAD 7 : Generalized Anxiety Score  Nervous, Anxious, on Edge 2 1 1  0  Control/stop worrying 1 2 1  0  Worry too much - different things 2 2 1  0  Trouble relaxing 2 1 1  0  Restless 1 2 0 0  Easily annoyed or irritable 2 2 1  0  Afraid - awful might happen 1 1 1  0  Total GAD 7 Score 11 11 6  0  Anxiety Difficulty Very difficult Very difficult Somewhat  difficult Not difficult at all   Relevant past medical, surgical, family and social history reviewed and updated as indicated. Interim medical history since our last visit reviewed. Allergies and medications reviewed and updated.  Review of Systems  Constitutional:  Positive for appetite change and fatigue. Negative for activity change, diaphoresis and fever.  Respiratory:  Negative for cough, chest tightness, shortness of breath and wheezing.   Cardiovascular:  Negative for chest pain, palpitations and leg  swelling.  Gastrointestinal:  Positive for abdominal pain, constipation and diarrhea. Negative for nausea and vomiting.  Endocrine: Negative for cold intolerance, heat intolerance, polydipsia, polyphagia and polyuria.  Musculoskeletal:  Positive for back pain.  Neurological: Negative.   Psychiatric/Behavioral:  Positive for sleep disturbance. Negative for decreased concentration, self-injury and suicidal ideas. The patient is not nervous/anxious.     Per HPI unless specifically indicated above    Objective:    BP 104/68 (BP Location: Left Arm, Patient Position: Sitting, Cuff Size: Normal)   Pulse (!) 102   Temp (!) 97.5 F (36.4 C) (Oral)   Ht 5\' 6"  (1.676 m)   Wt 167 lb 9.6 oz (76 kg)   LMP  (LMP Unknown)   SpO2 95%   BMI 27.05 kg/m   Wt Readings from Last 3 Encounters:  11/29/23 167 lb 9.6 oz (76 kg)  03/02/23 168 lb 3.2 oz (76.3 kg)  02/02/23 163 lb 9.6 oz (74.2 kg)    Physical Exam Vitals and nursing note reviewed.  Constitutional:      General: She is awake. She is not in acute distress.    Appearance: She is well-developed and well-groomed. She is not ill-appearing.  HENT:     Head: Normocephalic.     Right Ear: Hearing, tympanic membrane, ear canal and external ear normal.     Left Ear: Hearing, tympanic membrane, ear canal and external ear normal.  Eyes:     General: Lids are normal.        Right eye: No discharge.        Left eye: No discharge.      Conjunctiva/sclera: Conjunctivae normal.     Pupils: Pupils are equal, round, and reactive to light.  Neck:     Thyroid: No thyromegaly.     Vascular: No carotid bruit.  Cardiovascular:     Rate and Rhythm: Normal rate and regular rhythm.     Heart sounds: Normal heart sounds. No murmur heard.    No gallop.  Pulmonary:     Effort: Pulmonary effort is normal. No accessory muscle usage or respiratory distress.     Breath sounds: Normal breath sounds.  Abdominal:     General: Bowel sounds are normal.     Palpations: Abdomen is soft.     Tenderness: There is no abdominal tenderness.  Musculoskeletal:     Cervical back: Normal range of motion and neck supple.     Right lower leg: No edema.     Left lower leg: No edema.  Skin:    General: Skin is warm and dry.  Neurological:     Mental Status: She is alert and oriented to person, place, and time.     Coordination: Coordination is intact.     Gait: Gait is intact.     Deep Tendon Reflexes: Reflexes are normal and symmetric.     Reflex Scores:      Brachioradialis reflexes are 2+ on the right side and 2+ on the left side.      Patellar reflexes are 2+ on the right side and 2+ on the left side.    Comments: Uses cane to assist gait.  Psychiatric:        Attention and Perception: Attention normal.        Mood and Affect: Mood normal.        Speech: Speech normal.        Behavior: Behavior normal. Behavior is cooperative.    Results for orders placed or  performed in visit on 11/29/23  Hemoglobin A1c   Collection Time: 08/22/23 12:00 AM  Result Value Ref Range   A1c 8.2       Assessment & Plan:   Problem List Items Addressed This Visit       Cardiovascular and Mediastinum   Hypertension associated with diabetes (HCC)   Chronic, stable.  Maintain off Ramipril for now due to lower BP readings.  Recommend she continue to monitor BP at home daily and document + focus on DASH diet.  LABS: CMP, TSH, lipid, urine ALB today.  May need  to consider Midodrine in future if low BP continues and falls.      Relevant Medications   Dulaglutide (TRULICITY) 3 MG/0.5ML SOAJ   Insulin Glargine (BASAGLAR KWIKPEN [place])   insulin lispro (HUMALOG) 100 UNIT/ML injection   Other Relevant Orders   Microalbumin, Urine Waived   Comprehensive metabolic panel   CBC with Differential/Platelet   TSH     Endocrine   CKD stage 3 due to type 2 diabetes mellitus (HCC)   Chronic, ongoing, followed by endo.  A1c 8.2% in November with endo and urine ALB 30 in 2024, recheck urine today. Continue current regimen at this time and continue collaboration with endo.   CMP today.   - Vaccinations up to date. - Statin on board, ACE on hold due to low BP level. - Foot and Eye exam up to date.      Relevant Medications   Dulaglutide (TRULICITY) 3 MG/0.5ML SOAJ   Insulin Glargine (BASAGLAR KWIKPEN [place])   insulin lispro (HUMALOG) 100 UNIT/ML injection   Diabetic neuropathy (HCC) - Primary   Chronic, ongoing, followed by endo.  A1c 8.2% in November with endo and urine ALB 30 in 2024, recheck urine today. Continue current regimen at this time and continue collaboration with endo.   CMP today.  Gabapentin caused dizziness.  Continue Amitriptyline daily. - Vaccinations up to date. - Statin on board, ACE on hold due to low BP level. - Foot and Eye exam up to date.      Relevant Medications   Dulaglutide (TRULICITY) 3 MG/0.5ML SOAJ   Insulin Glargine (BASAGLAR KWIKPEN [place])   insulin lispro (HUMALOG) 100 UNIT/ML injection   Other Relevant Orders   Microalbumin, Urine Waived   Hyperlipidemia associated with type 2 diabetes mellitus (HCC)   Chronic, ongoing.  Continue current medication regimen and adjust as needed.  Obtain lipid panel today.        Relevant Medications   Dulaglutide (TRULICITY) 3 MG/0.5ML SOAJ   Insulin Glargine (BASAGLAR KWIKPEN )   insulin lispro (HUMALOG) 100 UNIT/ML injection   Other Relevant Orders   Comprehensive metabolic panel    Lipid Panel w/o Chol/HDL Ratio     Nervous and Auditory   Chronic bilateral low back pain with bilateral sciatica   Chronic issue for years that is worsening.  No benefit from OTC medications currently, nor from PT or injections in past.  Will obtain new imaging, cervical and lumbar spine.  Five days or Norco sent in to help with pain, discussed need to use this only if needed for severe pain.  She requests referral to neurosurgery, Dr. Marcell Barlow, will place this.      Relevant Medications   HYDROcodone-acetaminophen (NORCO/VICODIN) 5-325 MG tablet   Other Relevant Orders   Ambulatory referral to Neurosurgery   DG Cervical Spine Complete   DG Lumbar Spine Complete     Musculoskeletal and Integument   Osteopenia  Chronic, ongoing.  Last DEXA in 2024.  Continue Vitamin D supplement and recheck Vit D level today.  Collaborate with endo on changes in future if needed.      Relevant Orders   VITAMIN D 25 Hydroxy (Vit-D Deficiency, Fractures)     Other   Chronic neck pain   Chronic issue for years that is worsening.  No benefit from OTC medications currently, nor from PT or injections in past.  Will obtain new imaging, cervical and lumbar spine.  Five days or Norco sent in to help with pain, discussed need to use this only if needed for severe pain.  She requests referral to neurosurgery, Dr. Marcell Barlow, will place this.      Relevant Medications   HYDROcodone-acetaminophen (NORCO/VICODIN) 5-325 MG tablet   Depression   Chronic, ongoing. Will continue Prozac 40 MG daily and adjust regimen as needed.  Denies SI/HI.  Adjust medication as needed and monitor NA+ level.      Generalized abdominal pain   For months with ongoing fatigue and reduced appetite.  History of colon cancer.  Has not followed up with GI since May 2024.  Recommend she call and schedule visit, may need colonoscopy to further assess.      Insomnia   Chronic, ongoing with poor control -- continues to have ongoing  fatigue issues and poor sleep.  Did not tolerate Trazodone nor benefit from it + no benefit from Melatonin.  Continue Amitriptyline at this time.  Suspect pain is causing poor sleep. Consider sleep study if ongoing, she did not attend last time a referral was placed.      Long-term insulin use (HCC)   Chronic, ongoing.  Continue collaboration with endocrinology.      Vitamin B12 deficiency   Ongoing, continue daily supplement and recheck B12 level today.      Relevant Orders   Vitamin B12     Follow up plan: Return in about 8 weeks (around 01/24/2024) for Back Pain and Abdominal Pain.

## 2023-11-29 NOTE — Assessment & Plan Note (Signed)
Chronic, ongoing. Will continue Prozac 40 MG daily and adjust regimen as needed.  Denies SI/HI.  Adjust medication as needed and monitor NA+ level. °

## 2023-11-29 NOTE — Assessment & Plan Note (Signed)
Chronic, ongoing.  Continue current medication regimen and adjust as needed.  Obtain lipid panel today. 

## 2023-11-29 NOTE — Assessment & Plan Note (Signed)
 Chronic, ongoing.  Last DEXA in 2024.  Continue Vitamin D supplement and recheck Vit D level today.  Collaborate with endo on changes in future if needed.

## 2023-11-29 NOTE — Assessment & Plan Note (Signed)
 Chronic, ongoing, followed by endo.  A1c 8.2% in November with endo and urine ALB 30 in 2024, recheck urine today. Continue current regimen at this time and continue collaboration with endo.   CMP today.   - Vaccinations up to date. - Statin on board, ACE on hold due to low BP level. - Foot and Eye exam up to date.

## 2023-11-29 NOTE — Assessment & Plan Note (Signed)
 For months with ongoing fatigue and reduced appetite.  History of colon cancer.  Has not followed up with GI since May 2024.  Recommend she call and schedule visit, may need colonoscopy to further assess.

## 2023-11-29 NOTE — Assessment & Plan Note (Signed)
Ongoing, continue daily supplement and recheck B12 level today. 

## 2023-11-29 NOTE — Assessment & Plan Note (Signed)
Chronic, ongoing.  Continue collaboration with endocrinology. 

## 2023-11-30 LAB — CBC WITH DIFFERENTIAL/PLATELET
Basophils Absolute: 0 10*3/uL (ref 0.0–0.2)
Basos: 0 %
EOS (ABSOLUTE): 0.1 10*3/uL (ref 0.0–0.4)
Eos: 1 %
Hematocrit: 37.1 % (ref 34.0–46.6)
Hemoglobin: 12.2 g/dL (ref 11.1–15.9)
Immature Grans (Abs): 0 10*3/uL (ref 0.0–0.1)
Immature Granulocytes: 0 %
Lymphocytes Absolute: 1.2 10*3/uL (ref 0.7–3.1)
Lymphs: 16 %
MCH: 31.4 pg (ref 26.6–33.0)
MCHC: 32.9 g/dL (ref 31.5–35.7)
MCV: 95 fL (ref 79–97)
Monocytes Absolute: 0.3 10*3/uL (ref 0.1–0.9)
Monocytes: 3 %
Neutrophils Absolute: 6 10*3/uL (ref 1.4–7.0)
Neutrophils: 80 %
Platelets: 222 10*3/uL (ref 150–450)
RBC: 3.89 x10E6/uL (ref 3.77–5.28)
RDW: 12.2 % (ref 11.7–15.4)
WBC: 7.6 10*3/uL (ref 3.4–10.8)

## 2023-11-30 LAB — COMPREHENSIVE METABOLIC PANEL
ALT: 15 [IU]/L (ref 0–32)
AST: 22 [IU]/L (ref 0–40)
Albumin: 4.3 g/dL (ref 3.8–4.8)
Alkaline Phosphatase: 87 [IU]/L (ref 44–121)
BUN/Creatinine Ratio: 13 (ref 12–28)
BUN: 24 mg/dL (ref 8–27)
Bilirubin Total: 0.5 mg/dL (ref 0.0–1.2)
CO2: 18 mmol/L — ABNORMAL LOW (ref 20–29)
Calcium: 9.3 mg/dL (ref 8.7–10.3)
Chloride: 100 mmol/L (ref 96–106)
Creatinine, Ser: 1.89 mg/dL — ABNORMAL HIGH (ref 0.57–1.00)
Globulin, Total: 2.5 g/dL (ref 1.5–4.5)
Glucose: 261 mg/dL — ABNORMAL HIGH (ref 70–99)
Potassium: 5.2 mmol/L (ref 3.5–5.2)
Sodium: 136 mmol/L (ref 134–144)
Total Protein: 6.8 g/dL (ref 6.0–8.5)
eGFR: 28 mL/min/{1.73_m2} — ABNORMAL LOW (ref >59)

## 2023-11-30 LAB — LIPID PANEL W/O CHOL/HDL RATIO
Cholesterol, Total: 117 mg/dL (ref 100–199)
HDL: 51 mg/dL (ref >39)
LDL Chol Calc (NIH): 37 mg/dL (ref 0–99)
Triglycerides: 179 mg/dL — ABNORMAL HIGH (ref 0–149)
VLDL Cholesterol Cal: 29 mg/dL (ref 5–40)

## 2023-11-30 LAB — VITAMIN B12: Vitamin B-12: 371 pg/mL (ref 232–1245)

## 2023-11-30 LAB — TSH: TSH: 2.15 u[IU]/mL (ref 0.450–4.500)

## 2023-11-30 LAB — VITAMIN D 25 HYDROXY (VIT D DEFICIENCY, FRACTURES): Vit D, 25-Hydroxy: 4.6 ng/mL — ABNORMAL LOW (ref 30.0–100.0)

## 2023-11-30 NOTE — Progress Notes (Signed)
 Good morning crew, I attempted to call this patient and no answer.  Please call and alert her labs have returned: - Kidney function has declined into Stage 4, previously has been Stage 3b.  I would recommend a visit with a kidney doctor.  Are you okay with this referral being placed? - Vitamin D and B12 levels are low -- please ensure you are taking the weekly Vitamin D supplement and B12 1000 MCG daily.  B12 can offer benefit to neuropathy at times because if levels are low we sometimes see worse neuropathy discomfort. - Remainder of labs are stable.  Have you scheduled with GI as we discussed?  Any questions? Keep being stellar!!  Thank you for allowing me to participate in your care.  I appreciate you. Kindest regards, Makynzi Eastland

## 2023-12-02 NOTE — Progress Notes (Signed)
 Referral in place

## 2023-12-02 NOTE — Addendum Note (Signed)
 Addended by: Marjie Skiff on: 12/02/2023 02:55 PM   Modules accepted: Orders

## 2023-12-05 ENCOUNTER — Ambulatory Visit
Admission: RE | Admit: 2023-12-05 | Discharge: 2023-12-05 | Disposition: A | Discharge status: Home / Self Care | Attending: Nurse Practitioner | Admitting: Nurse Practitioner

## 2023-12-05 ENCOUNTER — Ambulatory Visit
Admission: RE | Admit: 2023-12-05 | Discharge: 2023-12-05 | Disposition: A | Discharge status: Home / Self Care | Source: Ambulatory Visit | Attending: Nurse Practitioner | Admitting: Nurse Practitioner

## 2023-12-05 DIAGNOSIS — M5442 Lumbago with sciatica, left side: Secondary | ICD-10-CM | POA: Diagnosis not present

## 2023-12-05 DIAGNOSIS — M47812 Spondylosis without myelopathy or radiculopathy, cervical region: Secondary | ICD-10-CM | POA: Diagnosis not present

## 2023-12-05 DIAGNOSIS — M47816 Spondylosis without myelopathy or radiculopathy, lumbar region: Secondary | ICD-10-CM | POA: Diagnosis not present

## 2023-12-05 DIAGNOSIS — M5441 Lumbago with sciatica, right side: Secondary | ICD-10-CM | POA: Diagnosis not present

## 2023-12-05 DIAGNOSIS — G8929 Other chronic pain: Secondary | ICD-10-CM | POA: Diagnosis not present

## 2023-12-05 DIAGNOSIS — M542 Cervicalgia: Secondary | ICD-10-CM | POA: Diagnosis not present

## 2023-12-05 DIAGNOSIS — M5136 Other intervertebral disc degeneration, lumbar region with discogenic back pain only: Secondary | ICD-10-CM | POA: Diagnosis not present

## 2023-12-05 DIAGNOSIS — I700 Atherosclerosis of aorta: Secondary | ICD-10-CM | POA: Diagnosis not present

## 2023-12-05 NOTE — Progress Notes (Signed)
 Appt scheduled

## 2023-12-06 NOTE — Progress Notes (Unsigned)
 Referring Physician:  Marjie Skiff, NP [address] [place],  [place] [address]  Primary Physician:  Marjie Skiff, NP  History of Present Illness: 12/07/2023 [name] has a history of HTN, DM, GERD, DM neuropathy, hyperlidemia, CKD stage 4, osteopenia, history of colon CA.   History of chronic neck and back pain x years.   She has constant neck pain x  years. She also notes balance issues that are getting worse. No arm pain. No numbness, tingling, or weakness. No dexterity issues.   She has chronic constant LBP with no leg pain. She has known neuropathy with numbness/tingling from knees into feet bilaterally. Pain got worse after falling down escalator in 2018. She has weakness in her legs. Worse with activity and better with rest.   She does not smoke.   Bowel/Bladder Dysfunction: she notes urinary urgency, no incontinence issues, no perineal numbness.   Conservative measures:  Physical therapy:  has not participated in recently (did not help several years ago) Multimodal medical therapy including regular antiinflammatories: lidocaine patches, norco Injections: did lumbar epidural steroid injections recently (did not help several years ago)  Past Surgery: no spinal surgeries  Jessica Montoya has some balance issues.  The symptoms are causing a significant impact on the patient's life.   Review of Systems:  A 10 point review of systems is negative, except for the pertinent positives and negatives detailed in the HPI.  Past Medical History: Past Medical History:  Diagnosis Date   Anxiety    Arthritis    knees   Chronic pain    CKD (chronic kidney disease), stage II    Depression    Diabetes mellitus without complication (HCC)    Diabetic neuropathy (HCC)    Fatigue    a. 05/2018 Echo: EF 60-65%, no rwma, nl RV fxn; b. 05/2018 Lexiscan MV: EF>65%. No ischemia/infarct.   Hyperlipidemia    Hypertension    Insomnia    Long term current use of insulin (HCC)     Peripheral neuropathy    Vertigo    2x/month   Wears dentures    full upper and lower    Past Surgical History: Past Surgical History:  Procedure Laterality Date   ABDOMINAL HYSTERECTOMY     CATARACT EXTRACTION W/PHACO Left 02/20/2019   Procedure: CATARACT EXTRACTION PHACO AND INTRAOCULAR LENS PLACEMENT (IOC) LEFT DIABETES;  Surgeon: Galen Manila, MD;  Location: [facility];  Service: Ophthalmology;  Laterality: Left;   CATARACT EXTRACTION W/PHACO Right 03/27/2019   Procedure: CATARACT EXTRACTION PHACO AND INTRAOCULAR LENS PLACEMENT (IOC)  RIGHT DIABETIC;  Surgeon: Galen Manila, MD;  Location: [facility];  Service: Ophthalmology;  Laterality: Right;  Diabetic - insulin   COLON SURGERY     COLONOSCOPY WITH PROPOFOL N/A 02/05/2020   Procedure: COLONOSCOPY WITH PROPOFOL;  Surgeon: Pasty Spillers, MD;  Location: [facility];  Service: Endoscopy;  Laterality: N/A;   ESOPHAGOGASTRODUODENOSCOPY (EGD) WITH PROPOFOL N/A 02/05/2020   Procedure: ESOPHAGOGASTRODUODENOSCOPY (EGD) WITH PROPOFOL;  Surgeon: Pasty Spillers, MD;  Location: [facility];  Service: Endoscopy;  Laterality: N/A;    Allergies: Allergies as of 12/07/2023 - Review Complete 12/07/2023  Allergen Reaction Noted   Compazine [prochlorperazine edisylate] Rash 05/21/2015   Exenatide Nausea Only 02/04/2016   Insulin aspart (human analog) Nausea Only 05/21/2015    Medications: Outpatient Encounter Medications as of 12/07/2023  Medication Sig   amitriptyline (ELAVIL) 25 MG tablet Take 1 tablet by mouth at bedtime.   aspirin  81 MG tablet Take 81 mg by mouth daily.   Cholecalciferol 1.25 MG (50000 UT) TABS Take 1 tablet by mouth once a week.   Dulaglutide (TRULICITY) 3 MG/0.5ML SOAJ Inject 3 mg into the skin once a week.   ferrous fumarate (HEMOCYTE - 106 MG FE) 325 (106 FE) MG TABS tablet Take 1 tablet by mouth daily.   FLUoxetine (PROZAC) 40 MG capsule TAKE 1 CAPSULE BY MOUTH  DAILY    Insulin Glargine (BASAGLAR KWIKPEN [place]) Inject 20 Units into the skin daily.   insulin lispro (HUMALOG) 100 UNIT/ML injection Inject 16 Units into the skin 2 (two) times daily.   Melatonin 12 MG TABS Take 2 tablets by mouth daily. bedtime   Multiple Vitamin (MULTIVITAMIN) tablet Take 1 tablet by mouth daily.   omeprazole (PRILOSEC) 20 MG capsule TAKE 1 CAPSULE BY MOUTH TWICE  DAILY BEFORE A MEAL   ramipril (ALTACE) 2.5 MG capsule Take 1 capsule (2.5 mg total) by mouth daily. (Patient not taking: Reported on 11/29/2023)   simvastatin (ZOCOR) 40 MG tablet Take 1 tablet (40 mg total) by mouth daily.   vitamin B-12 (CYANOCOBALAMIN) 1000 MCG tablet Take 2,000 mcg by mouth daily.   No facility-administered encounter medications on file as of 12/07/2023.    Social History: Social History   Tobacco Use   Smoking status: Never   Smokeless tobacco: Never  Vaping Use   Vaping status: Never Used  Substance Use Topics   Alcohol use: No    Alcohol/week: 0.0 standard drinks of alcohol   Drug use: No    Family Medical History: Family History  Problem Relation Age of Onset   Cancer Mother        melanoma   Breast cancer Mother 66   Cancer Father        pancreatic   Hypertension Maternal Grandmother    Cancer Maternal Grandmother        colon   Cancer Paternal Grandmother        stomach   Stroke Paternal Grandfather    Cancer Maternal Grandfather        kidney    Physical Examination: Vitals:   12/07/23 1355  BP: 120/70    General: Patient is well developed, well nourished, calm, collected, and in no apparent distress. Attention to examination is appropriate.  Respiratory: Patient is breathing without any difficulty.   NEUROLOGICAL:     Awake, alert, oriented to person, place, and time.  Speech is clear and fluent. Fund of knowledge is appropriate.   Cranial Nerves: Pupils equal round and reactive to light.  Facial tone is symmetric.    No posterior cervical tenderness. Diffuse  mild tenderness in bilateral trapezial region.   Diffuse lower posterior lumbar tenderness.   No abnormal lesions on exposed skin.   Strength: Side Biceps Triceps Deltoid Interossei Grip Wrist Ext. Wrist Flex.  R 5 5 5 5 5 5 5   L 5 5 5 5 5 5 5    Side Iliopsoas Quads Hamstring PF DF EHL  R 5 5 5 5 5 5   L 5 5 5 5 5 5    Reflexes are 1+ and symmetric at the biceps, brachioradialis, patella and achilles.   Hoffman's is absent.  Clonus is not present.   Bilateral upper and lower extremity sensation is intact to light touch, but diminished in bilateral legs from knees to feet.   Reasonable ROM of both shoulder with no pain.   No pain with IR/ER of both hips.  Gait is slow and slightly unsteady. She ambulates with a cane.   Medical Decision Making  Imaging: Cervical xrays dated 12/05/23:  Retrolisthesis C4-C5, diffuse cervical spondylosis and DDD.   Lumbar xrays dated 12/05/23:  Severe DDD L5-S1 with mild diffuse lumbar spondylosis.   Report for above xrays is not yet available. Cervical xrays reviewed with Dr. Katrinka Blazing.   Assessment and Plan: Jessica Montoya has constant neck pain x  years. She also notes balance issues that are getting worse. No arm pain. No numbness, tingling, or weakness. No dexterity issues.   She has known retrolisthesis C4-C5 with diffuse cervical spondylosis and DDD.   She has chronic constant LBP with no leg pain. She has known neuropathy with numbness/tingling from knees into feet bilaterally.   She has known severe DDD L5-S1 with mild diffuse lumbar spondylosis  Treatment options discussed with patient and following plan made:   - MRI of cervical and lumbar spine to further evaluate pain. No improvement with previous PT, injections, time, or medications.  - She can continue on prn norco per PCP.  - Medications limited, she has CKD stage 4.  - May consider cervical xrays with flex/ext at some point.  - Depending on results, may consider revisiting PT and/or  injections.   Area of calcification seen on cervical AP xray that is possible vascular. Will check report once radiology reads xrays. Will call to get read done.   I spent a total of 45 minutes in face-to-face and non-face-to-face activities related to this patient's care today including review of outside records, review of imaging, review of symptoms, physical exam, discussion of differential diagnosis, discussion of treatment options, and documentation.   Thank you for involving me in the care of this patient.   Drake Leach PA-C Dept. of Neurosurgery

## 2023-12-07 ENCOUNTER — Encounter: Payer: Self-pay | Admitting: Orthopedic Surgery

## 2023-12-07 ENCOUNTER — Ambulatory Visit: Payer: Medicare Other | Admitting: Orthopedic Surgery

## 2023-12-07 VITALS — BP 120/70 | Ht 66.0 in | Wt 168.8 lb

## 2023-12-07 DIAGNOSIS — M5136 Other intervertebral disc degeneration, lumbar region with discogenic back pain only: Secondary | ICD-10-CM | POA: Diagnosis not present

## 2023-12-07 DIAGNOSIS — M47816 Spondylosis without myelopathy or radiculopathy, lumbar region: Secondary | ICD-10-CM | POA: Diagnosis not present

## 2023-12-07 DIAGNOSIS — M50321 Other cervical disc degeneration at C4-C5 level: Secondary | ICD-10-CM

## 2023-12-07 DIAGNOSIS — M47812 Spondylosis without myelopathy or radiculopathy, cervical region: Secondary | ICD-10-CM | POA: Diagnosis not present

## 2023-12-07 DIAGNOSIS — E1122 Type 2 diabetes mellitus with diabetic chronic kidney disease: Secondary | ICD-10-CM

## 2023-12-07 DIAGNOSIS — M5030 Other cervical disc degeneration, unspecified cervical region: Secondary | ICD-10-CM

## 2023-12-07 DIAGNOSIS — N184 Chronic kidney disease, stage 4 (severe): Secondary | ICD-10-CM

## 2023-12-07 DIAGNOSIS — M4312 Spondylolisthesis, cervical region: Secondary | ICD-10-CM

## 2023-12-07 DIAGNOSIS — M542 Cervicalgia: Secondary | ICD-10-CM

## 2023-12-07 NOTE — Patient Instructions (Signed)
 It was so nice to see you today. Thank you so much for coming in.    You have some wear and tear in your neck and lower back.   I want to get an MRI of your neck and lower back to look into things further. We will get this approved through your insurance and [facility] will call you to schedule the appointment.   [facility] (building with the white pillars) is located off of [address]. The address is [address] [place], [place] [address].    After you have the MRI, it takes 14-21 days for me to get the results back. Once I have them, we will call you to schedule a follow up visit with me to review them.   Please do not hesitate to call if you have any questions or concerns. You can also message me in MyChart.   Drake Leach PA-C [phone]     The physicians and staff at [facility] at [facility] are committed to providing excellent care. You may receive a survey asking for feedback about your experience at our office. We value you your feedback and appreciate you taking the time to to fill it out. The [facility] leadership team is also available to discuss your experience in person, feel free to contact us [phone].

## 2023-12-08 NOTE — Progress Notes (Signed)
 Good morning, please let Jessica Montoya know her imaging has returned and there is worsening degenerative disc to lower back and arthritic changes to neck area.  I am glad you are seeing neurosurgery and are in good hands with them. I see they already have MRIs ordered.  Let me know if you need anything. Keep being amazing!!  Thank you for allowing me to participate in your care.  I appreciate you. Kindest regards, Kristan Votta

## 2023-12-13 NOTE — Progress Notes (Signed)
 2nd attempt made to reach patient and discuss results. Will try again on next business day. Ok for E2C2 to review results if call is returned.

## 2023-12-21 DIAGNOSIS — E1122 Type 2 diabetes mellitus with diabetic chronic kidney disease: Secondary | ICD-10-CM | POA: Diagnosis not present

## 2023-12-21 DIAGNOSIS — E785 Hyperlipidemia, unspecified: Secondary | ICD-10-CM | POA: Diagnosis not present

## 2023-12-21 DIAGNOSIS — R8290 Unspecified abnormal findings in urine: Secondary | ICD-10-CM | POA: Diagnosis not present

## 2023-12-21 DIAGNOSIS — I129 Hypertensive chronic kidney disease with stage 1 through stage 4 chronic kidney disease, or unspecified chronic kidney disease: Secondary | ICD-10-CM | POA: Diagnosis not present

## 2023-12-21 DIAGNOSIS — N184 Chronic kidney disease, stage 4 (severe): Secondary | ICD-10-CM | POA: Diagnosis not present

## 2023-12-21 DIAGNOSIS — R809 Proteinuria, unspecified: Secondary | ICD-10-CM | POA: Diagnosis not present

## 2023-12-22 ENCOUNTER — Other Ambulatory Visit: Payer: Self-pay | Admitting: Nephrology

## 2023-12-22 DIAGNOSIS — E1122 Type 2 diabetes mellitus with diabetic chronic kidney disease: Secondary | ICD-10-CM

## 2023-12-22 DIAGNOSIS — N184 Chronic kidney disease, stage 4 (severe): Secondary | ICD-10-CM

## 2023-12-22 DIAGNOSIS — R8290 Unspecified abnormal findings in urine: Secondary | ICD-10-CM

## 2023-12-22 DIAGNOSIS — R809 Proteinuria, unspecified: Secondary | ICD-10-CM

## 2023-12-27 DIAGNOSIS — E1122 Type 2 diabetes mellitus with diabetic chronic kidney disease: Secondary | ICD-10-CM | POA: Diagnosis not present

## 2023-12-27 DIAGNOSIS — E119 Type 2 diabetes mellitus without complications: Secondary | ICD-10-CM | POA: Diagnosis not present

## 2023-12-28 ENCOUNTER — Ambulatory Visit
Admission: RE | Admit: 2023-12-28 | Discharge: 2023-12-28 | Disposition: A | Discharge status: Home / Self Care | Source: Ambulatory Visit | Attending: Orthopedic Surgery | Admitting: Orthopedic Surgery

## 2023-12-28 DIAGNOSIS — M5137 Other intervertebral disc degeneration, lumbosacral region with discogenic back pain only: Secondary | ICD-10-CM | POA: Diagnosis not present

## 2023-12-28 DIAGNOSIS — M5030 Other cervical disc degeneration, unspecified cervical region: Secondary | ICD-10-CM | POA: Insufficient documentation

## 2023-12-28 DIAGNOSIS — M4802 Spinal stenosis, cervical region: Secondary | ICD-10-CM | POA: Diagnosis not present

## 2023-12-28 DIAGNOSIS — G8929 Other chronic pain: Secondary | ICD-10-CM | POA: Diagnosis not present

## 2023-12-28 DIAGNOSIS — M47816 Spondylosis without myelopathy or radiculopathy, lumbar region: Secondary | ICD-10-CM | POA: Insufficient documentation

## 2023-12-28 DIAGNOSIS — M542 Cervicalgia: Secondary | ICD-10-CM

## 2023-12-28 DIAGNOSIS — M50221 Other cervical disc displacement at C4-C5 level: Secondary | ICD-10-CM | POA: Diagnosis not present

## 2023-12-28 DIAGNOSIS — M47812 Spondylosis without myelopathy or radiculopathy, cervical region: Secondary | ICD-10-CM | POA: Diagnosis not present

## 2023-12-28 DIAGNOSIS — M5136 Other intervertebral disc degeneration, lumbar region with discogenic back pain only: Secondary | ICD-10-CM | POA: Insufficient documentation

## 2023-12-28 DIAGNOSIS — M5126 Other intervertebral disc displacement, lumbar region: Secondary | ICD-10-CM | POA: Diagnosis not present

## 2023-12-30 ENCOUNTER — Ambulatory Visit

## 2024-01-02 ENCOUNTER — Ambulatory Visit
Admission: RE | Admit: 2024-01-02 | Discharge: 2024-01-02 | Disposition: A | Discharge status: Home / Self Care | Source: Ambulatory Visit | Attending: Nurse Practitioner | Admitting: Nurse Practitioner

## 2024-01-02 DIAGNOSIS — R8290 Unspecified abnormal findings in urine: Secondary | ICD-10-CM | POA: Insufficient documentation

## 2024-01-02 DIAGNOSIS — R809 Proteinuria, unspecified: Secondary | ICD-10-CM | POA: Diagnosis not present

## 2024-01-02 DIAGNOSIS — E1122 Type 2 diabetes mellitus with diabetic chronic kidney disease: Secondary | ICD-10-CM | POA: Diagnosis not present

## 2024-01-02 DIAGNOSIS — N184 Chronic kidney disease, stage 4 (severe): Secondary | ICD-10-CM | POA: Diagnosis not present

## 2024-01-24 DIAGNOSIS — I959 Hypotension, unspecified: Secondary | ICD-10-CM | POA: Diagnosis not present

## 2024-01-24 DIAGNOSIS — N184 Chronic kidney disease, stage 4 (severe): Secondary | ICD-10-CM | POA: Diagnosis not present

## 2024-01-24 DIAGNOSIS — R809 Proteinuria, unspecified: Secondary | ICD-10-CM | POA: Diagnosis not present

## 2024-01-24 DIAGNOSIS — E785 Hyperlipidemia, unspecified: Secondary | ICD-10-CM | POA: Diagnosis not present

## 2024-01-24 DIAGNOSIS — E1122 Type 2 diabetes mellitus with diabetic chronic kidney disease: Secondary | ICD-10-CM | POA: Diagnosis not present

## 2024-01-30 ENCOUNTER — Ambulatory Visit: Payer: Medicare Other | Admitting: Nurse Practitioner

## 2024-02-03 NOTE — Progress Notes (Unsigned)
 Telephone Visit- Progress Note: Referring Physician:  Lemar Pyles, NP [address] [place],  [place] [address]  Primary Physician:  Lemar Pyles, NP  This visit was performed via telephone.  Patient location: home Provider location: office  I spent a total of 10 minutes non-face-to-face activities for this visit on the date of this encounter including review of current clinical condition and response to treatment.    Patient has given verbal consent to this telephone visits and we reviewed the limitations of a telephone visit. Patient wishes to proceed.    Chief Complaint:  review imaging  History of Present Illness: [name] has a history of HTN, DM, GERD, DM neuropathy, hyperlidemia, CKD stage 4, osteopenia, history of colon CA.   History of chronic neck and back pain x years.   Last seen by me on 12/07/23 for neck and LBP. She has known retrolisthesis C4-C5 with diffuse cervical spondylosis and DDD. She also has known severe DDD L5-S1 with mild diffuse lumbar spondylosis.   Phone visit scheduled to review her cervical and lumbar MRI scans.   She is about the same. She continues with constant neck pain with no arm. No numbness, tingling, or weakness. No dexterity issues. No specific aggravating/alleviating factors. She notes intermittent popping in her neck.   She has chronic constant LBP with no leg pain. She has known neuropathy with numbness/tingling from knees into feet bilaterally. She has weakness in her legs. Worse with activity and better with rest.   She does not smoke.   Bowel/Bladder Dysfunction: she notes urinary urgency, no incontinence issues, no perineal numbness.   Conservative measures:  Physical therapy:  has not participated in recently (did not help several years ago) Multimodal medical therapy including regular antiinflammatories: lidocaine  patches, norco Injections: did lumbar epidural steroid injections recently (did not help several  years ago)  Past Surgery: no spinal surgeries  The symptoms are causing a significant impact on the patient's life.   Exam: No exam done as this was a telephone encounter.     Imaging: Cervical MRI dated 12/28/23:  FINDINGS: Alignment: Mild retrolisthesis of C4 on C5.   Vertebrae: Degenerative endplate marrow changes at a few levels. No acute fracture is identified.   Cord: Normal signal and morphology.   Posterior Fossa, vertebral arteries, paraspinal tissues: The visualized portions of the skull base and the posterior fossa are normal. No soft tissue abnormality is identified.   Disc levels:   C2-C3: The disk is normal in configuration. No facet arthropathy. No uncovertebral joint disease. No neuroforaminal stenosis. No spinal canal stenosis.   C3-C4: Disc osteophyte complex. No facet arthropathy. Mild left uncovertebral joint disease. Mild left neuroforaminal stenosis. Mild spinal canal stenosis.   C4-C5: Disc osteophyte complex. Mild bilateral facet arthropathy. Moderate bilateral uncovertebral joint disease. Moderate bilateral neuroforaminal stenosis. Mild spinal canal stenosis.   C5-C6: The disk is normal in configuration. No facet arthropathy. No uncovertebral joint disease. No neuroforaminal stenosis. No spinal canal stenosis.   C6-C7: The disk is normal in configuration. No facet arthropathy. No uncovertebral joint disease. No neuroforaminal stenosis. No spinal canal stenosis.   C7-T1: The disk is normal in configuration. No facet arthropathy. No uncovertebral joint disease. No neuroforaminal stenosis. No spinal canal stenosis.   IMPRESSION: 1. Mild canal stenoses at C3-C4 and C4-C5 secondary to disc osteophyte complexes. 2. Moderate foraminal stenosis bilaterally at C4-C5 secondary to uncovertebral joint disease.     Electronically Signed   By: Johnanna Mylar  M.D.   On: 01/24/2024 09:40   Lumbar MRI dated 12/28/23:  FINDINGS: Segmentation:  Standard.   Alignment:  Physiologic lumbar alignment is maintained.   Vertebrae: Vertebral bodies demonstrate normal signal intensity. No compression fractures.   Conus medullaris and cauda equina: The conus medullaris terminates at the level of L1-L2. The distal spinal cord signal intensity is normal.   Paraspinal and other soft tissues: The visualized abdomen and pelvis show no soft tissue abnormality. The visualized aorta is normal.   Disc levels:   L1-L2: Small left central protrusion. No facet arthropathy. No neuroforaminal stenosis. No spinal canal stenosis.   L2-L3: Disc is normal in configuration. No facet arthropathy. No neuroforaminal stenosis. No spinal canal stenosis.   L3-L4: Disc is normal in configuration. No facet arthropathy. No neuroforaminal stenosis. No spinal canal stenosis.   L4-L5: Disc is normal in configuration. No facet arthropathy. No neuroforaminal stenosis. No spinal canal stenosis.   L5-S1: Disc bulge with small left central protrusion. No facet arthropathy. No neuroforaminal stenosis. No spinal canal stenosis.   IMPRESSION: Mild disc bulging with small protrusions without significant canal or foraminal stenosis.     Electronically Signed   By: Johnanna Mylar M.D.   On: 01/24/2024 09:34    I have personally reviewed the images and agree with the above interpretation.  Assessment and Plan: She continues with constant neck pain with no arm. No numbness, tingling, or weakness.   She has known mild left foraminal and central stenosis C3-C$ along with retrolisthesis C4-C5 with moderate central and bilateral foraminal stenosis.   She also has constant LBP with no leg pain. She has known neuropathy with numbness/tingling from knees into feet bilaterally. She has weakness in her legs. Worse with activity and better with rest.   She has known severe DDD L5-S1 with mild diffuse lumbar spondylosis. No compression seen on MRI.   Treatment options  discussed with patient and following plan made:   - Recommend cervical xrays with flex/ext to further evaluate retrolisthesis C4-C5.  - Discussed revisiting PT for cervical and lumbar spine. Has done PT in the past with no improvement.  - Also discussed injections for cervical and lumbar spine. Has done in the past for lumbar spine with no improvement.  - Medications limited due to CKD- can only take tylenol . No relief with muscle relaxers in the past.   She would like to discuss all of the above with her husband. She will call back to let me know how she wants to proceed.   Lucetta Russel PA-C Neurosurgery

## 2024-02-06 ENCOUNTER — Ambulatory Visit: Admitting: Orthopedic Surgery

## 2024-02-06 ENCOUNTER — Encounter: Payer: Self-pay | Admitting: Orthopedic Surgery

## 2024-02-06 DIAGNOSIS — M47816 Spondylosis without myelopathy or radiculopathy, lumbar region: Secondary | ICD-10-CM | POA: Diagnosis not present

## 2024-02-06 DIAGNOSIS — M5136 Other intervertebral disc degeneration, lumbar region with discogenic back pain only: Secondary | ICD-10-CM

## 2024-02-06 DIAGNOSIS — M4310 Spondylolisthesis, site unspecified: Secondary | ICD-10-CM

## 2024-02-06 DIAGNOSIS — M4802 Spinal stenosis, cervical region: Secondary | ICD-10-CM

## 2024-02-06 DIAGNOSIS — G629 Polyneuropathy, unspecified: Secondary | ICD-10-CM

## 2024-02-06 DIAGNOSIS — M47812 Spondylosis without myelopathy or radiculopathy, cervical region: Secondary | ICD-10-CM | POA: Diagnosis not present

## 2024-02-16 ENCOUNTER — Other Ambulatory Visit: Payer: Self-pay | Admitting: Nurse Practitioner

## 2024-02-17 NOTE — Telephone Encounter (Signed)
 Requested Prescriptions  Pending Prescriptions Disp Refills   simvastatin  (ZOCOR ) 40 MG tablet [Pharmacy Med Name: Simvastatin  40 MG Oral Tablet] 100 tablet 2    Sig: TAKE 1 TABLET BY MOUTH DAILY     Cardiovascular:  Antilipid - Statins Failed - 02/17/2024  1:13 PM      Failed - Lipid Panel in normal range within the last 12 months    Cholesterol, Total  Date Value Ref Range Status  11/29/2023 117 100 - 199 mg/dL Final   Cholesterol Piccolo, Waived  Date Value Ref Range Status  05/21/2015 125 <200 mg/dL Final    Comment:                            Desirable                <200                         Borderline High      200- 239                         High                     >239    LDL Chol Calc (NIH)  Date Value Ref Range Status  11/29/2023 37 0 - 99 mg/dL Final   HDL  Date Value Ref Range Status  11/29/2023 51 >39 mg/dL Final   Triglycerides  Date Value Ref Range Status  11/29/2023 179 (H) 0 - 149 mg/dL Final   Triglycerides Piccolo,Waived  Date Value Ref Range Status  05/21/2015 225 (H) <150 mg/dL Final    Comment:                            Normal                   <150                         Borderline High     150 - 199                         High                200 - 499                         Very High                >499          Passed - Patient is not pregnant      Passed - Valid encounter within last 12 months    Recent Outpatient Visits           2 months ago Diabetic polyneuropathy associated with type 2 diabetes mellitus (HCC)   [facility], Lavelle Posey, NP

## 2024-02-28 ENCOUNTER — Ambulatory Visit

## 2024-06-10 NOTE — Patient Instructions (Signed)

## 2024-06-12 ENCOUNTER — Encounter: Payer: Self-pay | Admitting: Nurse Practitioner

## 2024-06-12 ENCOUNTER — Ambulatory Visit: Admitting: Nurse Practitioner

## 2024-06-12 VITALS — BP 101/69 | HR 82 | Temp 98.9°F | Resp 17 | Ht 65.98 in | Wt 165.0 lb

## 2024-06-12 DIAGNOSIS — I951 Orthostatic hypotension: Secondary | ICD-10-CM | POA: Diagnosis not present

## 2024-06-12 DIAGNOSIS — E1169 Type 2 diabetes mellitus with other specified complication: Secondary | ICD-10-CM

## 2024-06-12 DIAGNOSIS — E559 Vitamin D deficiency, unspecified: Secondary | ICD-10-CM | POA: Diagnosis not present

## 2024-06-12 DIAGNOSIS — E538 Deficiency of other specified B group vitamins: Secondary | ICD-10-CM

## 2024-06-12 DIAGNOSIS — E1142 Type 2 diabetes mellitus with diabetic polyneuropathy: Secondary | ICD-10-CM

## 2024-06-12 DIAGNOSIS — E785 Hyperlipidemia, unspecified: Secondary | ICD-10-CM | POA: Diagnosis not present

## 2024-06-12 DIAGNOSIS — F5101 Primary insomnia: Secondary | ICD-10-CM | POA: Diagnosis not present

## 2024-06-12 DIAGNOSIS — F331 Major depressive disorder, recurrent, moderate: Secondary | ICD-10-CM

## 2024-06-12 DIAGNOSIS — E1122 Type 2 diabetes mellitus with diabetic chronic kidney disease: Secondary | ICD-10-CM | POA: Diagnosis not present

## 2024-06-12 DIAGNOSIS — I152 Hypertension secondary to endocrine disorders: Secondary | ICD-10-CM

## 2024-06-12 DIAGNOSIS — Z794 Long term (current) use of insulin: Secondary | ICD-10-CM | POA: Diagnosis not present

## 2024-06-12 DIAGNOSIS — E1159 Type 2 diabetes mellitus with other circulatory complications: Secondary | ICD-10-CM | POA: Diagnosis not present

## 2024-06-12 DIAGNOSIS — N183 Chronic kidney disease, stage 3 unspecified: Secondary | ICD-10-CM | POA: Diagnosis not present

## 2024-06-12 LAB — BAYER DCA HB A1C WAIVED: HB A1C (BAYER DCA - WAIVED): 9.8 % — ABNORMAL HIGH (ref 4.8–5.6)

## 2024-06-12 MED ORDER — BELSOMRA 5 MG PO TABS: 5.0000 mg | ORAL_TABLET | Freq: Every evening | ORAL | 1 refills | Status: AC | PRN

## 2024-06-12 MED ORDER — CHOLECALCIFEROL 1.25 MG (50000 UT) PO TABS: 1.0000 | ORAL_TABLET | ORAL | 4 refills | Status: AC

## 2024-06-12 MED ORDER — FLUOXETINE HCL 40 MG PO CAPS: ORAL_CAPSULE | ORAL | 4 refills | Status: AC

## 2024-06-12 MED ORDER — SIMVASTATIN 40 MG PO TABS: 40.0000 mg | ORAL_TABLET | Freq: Every day | ORAL | 2 refills | Status: AC

## 2024-06-12 MED ORDER — RAMIPRIL 2.5 MG PO CAPS: 2.5000 mg | ORAL_CAPSULE | Freq: Every day | ORAL | 4 refills | Status: DC

## 2024-06-12 MED ORDER — OMEPRAZOLE 20 MG PO CPDR: DELAYED_RELEASE_CAPSULE | ORAL | 4 refills | Status: AC

## 2024-06-12 NOTE — Assessment & Plan Note (Signed)
Ongoing, continue daily supplement and recheck Vit D level today. 

## 2024-06-12 NOTE — Assessment & Plan Note (Addendum)
 Chronic, ongoing, followed by endo.  A1c 9.8% today in office and urine ALB 30 in 2024, recheck urine today. She sees endo upcoming, will defer changes to them and provided her A1c result to take with her.  Continue current regimen at this time and continue collaboration with endo.   CMP today.  Gabapentin  & Lyrica caused dizziness.  Continue Amitriptyline  daily.  Referral to neurology for further testing and recommendations due to worsening balance and weakness + will work on getting manual wheelchair for her so she can attend visits and go on outings.  She is a higher fall risk. - Vaccinations up to date. - Statin on board, ACE on hold due to low BP level. - Foot and Eye exam up to date.

## 2024-06-12 NOTE — Assessment & Plan Note (Signed)
Chronic, ongoing. Will continue Prozac 40 MG daily and adjust regimen as needed.  Denies SI/HI.  Adjust medication as needed and monitor NA+ level. °

## 2024-06-12 NOTE — Assessment & Plan Note (Addendum)
 Chronic, stable.  Maintain off Ramipril  for now due to lower BP readings and continue Farxiga.  Recommend she continue to monitor BP at home daily and document + focus on DASH diet.  LABS: CMP, urine ALB today.  May need to consider Midodrine in future if low BP and falls.

## 2024-06-12 NOTE — Assessment & Plan Note (Signed)
Chronic, ongoing.  Continue collaboration with endocrinology. 

## 2024-06-12 NOTE — Assessment & Plan Note (Signed)
 Chronic, ongoing with poor control -- continues to have ongoing fatigue issues and poor sleep.  Did not tolerate Trazodone  nor benefit from it + no benefit from Melatonin.  Continue Amitriptyline  at this time.  Will see if can get Belsomra  covered at lowest dose. Consider sleep study if ongoing, she did not attend last time a referral was placed.

## 2024-06-12 NOTE — Progress Notes (Signed)
 BP 101/69 (BP Location: Left Arm, Patient Position: Sitting, Cuff Size: Normal)   Pulse 82   Temp 98.9 F (37.2 C) (Oral)   Resp 17   Ht 5' 5.98 (1.676 m)   Wt 165 lb (74.8 kg)   LMP  (LMP Unknown)   SpO2 98%   BMI 26.64 kg/m    Subjective:    Patient ID: Jessica Montoya, female    DOB: Aug 28, 1951, 73 y.o.   MRN: [id number]  HPI: Jessica Montoya is a 73 y.o. female  Chief Complaint  Patient presents with   Medication Refill   Chronic Kidney Disease    Doctor took her off amitriptyline  but she was given the ok by endo to resume this medication to help with leg pain. Simvastatin  was also d/c and not resumed.    Diabetes    Ranging non fasting around 180. Headaches, nausea, vomiting and some blurry vision.    Balance    A little over a year where she relies on her husband when out in public it is getting worse with time.   Insomnia    Ongoing for a couple years and uses melatonin gummies but don't seem to help much. Finds herself only falling asleep after hours of trying.    DIABETES Scheduled to see Dr. Damian on 06/14/24, has missed some follow-up visits with PCP and her.  Last A1c appears to be from November 2024 when was 8.2%. Continues Basaglar , Humalog, Trulicity, Farxiga (started by nephrology).  Previously took U 500 and Metformin .  Taking B12 at home and D3 for past lows. Amtriptyline for neuropathy.  Tried multiple other medications in past without benefit. Hypoglycemic episodes: occasionally Polydipsia/polyuria: no Visual disturbance: no Chest pain: no Paresthesias: no Glucose Monitoring: yes             Accucheck frequency: TID with Libre -- 52 to 340 various times of day             Fasting glucose:              Post prandial:              Evening:             Before meals:  Taking Insulin ?: yes             Long acting insulin : 60 units             Short acting insulin : 16 units before breakfast and dinner Blood Pressure Monitoring: rarely Retinal  Examination: Up to Date -- [facility] -- was getting shots Foot Exam: Up to Date Pneumovax: Up to Date Influenza: Refused Aspirin : yes    HYPERTENSION / HYPERLIPIDEMIA Takes Simvastatin  daily.  Took Ramipril  in past but caused lower BP levels. Has not seen cardiology since 04/03/20. Satisfied with current treatment? yes Duration of hypertension: chronic BP monitoring frequency: 2-3 times BP range: 100/60 range on average BP medication side effects: no Duration of hyperlipidemia: chronic Cholesterol medication side effects: no Cholesterol supplements: none Medication compliance: good compliance Aspirin : no Recent stressors: no Recurrent headaches: no Visual changes: no Palpitations: no Dyspnea: no Chest pain: no Lower extremity edema: no Dizzy/lightheaded: weakness and dizziness is ongoing issue, will give out easily  CHRONIC KIDNEY DISEASE Follows with nephrology, last visit on 01/24/24.  She reports they stopped Amitriptyline  at that visit but she has restarted as her neuropathy became worse without it.   CKD status: stable Medications renally dose: yes Previous renal evaluation: yes Pneumovax:  Up to Date Influenza Vaccine:  refuses   OSTEOPENIA Follows with endo and taking Vitamin D  daily. Satisfied with current treatment?: yes Past osteoporosis medications/treatments: none Adequate calcium  & vitamin D : yes Intolerance to bisphosphonates: none Weight bearing exercises: yes  BACK AND NECK PAIN WITH NEUROPATHY Chronic lower back and neck pain for years. MRI lumbar and cervical spine on 12/28/23 with disc bulging and protrusions lumbar spine + stenosis, similar findings to cervical spine. Is high fall risk. Saw neurosurgery last on 02/06/24. Does notice ongoing issues with numbness to lower legs and feet.  Will start walking and legs give way. Has fallen 4 times recently without major injury, cuts and bruises only.  Her husband is having trouble with helping her maintain  balance when out and about.  Would like a manual wheelchair to assist in quality of life and her ability to attend appointments and go to stores.  Needs lighter one, 34 lbs, as husband has history of back surgery and cannot lift a heavy chair.   Did injections years ago and PT, but no benefit. Pain control status: stable Duration: chronic Location: back -- 7/10 today, stays in the 7 range often notices more if standing for long periods Quality: sharp, dull, aching, and throbbing Current Pain Level: 7/10 Previous Pain Level: 7/10 What Activities task can be accomplished with current medication? varies Previous pain specialty evaluation: no Non-narcotic analgesic meds: yes -- Tylenol    DEPRESSION Taking Prozac  40 MG. Has tried Trazodone  and Melatonin for sleep, both with no benefit.  Higher doses Trazodone  made her too sleepy and lower doses did not work. In past took Ambien  10 MG, many years ago with previous PCP. Takes Amitriptyline  for RLS at night ordered by Dr. Damian with endo.   Mood status: stable Satisfied with current treatment?: yes Symptom severity: mild  Duration of current treatment : chronic Side effects: no Medication compliance: good compliance Psychotherapy/counseling: none Depressed mood: occasional Anxious mood: occasional Anhedonia: yes Significant weight loss or gain: no Insomnia: yes hard to fall asleep  Fatigue: yes Feelings of worthlessness or guilt: no Impaired concentration/indecisiveness: no Suicidal ideations: no Hopelessness: no Crying spells: occasional    06/12/2024    1:25 PM 11/29/2023    2:38 PM 02/02/2023    1:21 PM 04/27/2022   10:18 AM 11/04/2021    3:12 PM  Depression screen PHQ 2/9  Decreased Interest 2 3 2 2  0  Down, Depressed, Hopeless 2 2 1  0 0  PHQ - 2 Score 4 5 3 2  0  Altered sleeping 3 3 3 3 2   Tired, decreased energy 3 3 3 3 3   Change in appetite 2 3 2  0 1  Feeling bad or failure about yourself  1 2 1  0 0  Trouble concentrating 1 1  1  0 0  Moving slowly or fidgety/restless 3 2 1  0 0  Suicidal thoughts 0 0 0 0 0  PHQ-9 Score 17 19 14 8 6   Difficult doing work/chores Very difficult Very difficult Very difficult        06/12/2024    1:27 PM 11/29/2023    2:38 PM 02/02/2023    1:22 PM 11/04/2021    3:13 PM  GAD 7 : Generalized Anxiety Score  Nervous, Anxious, on Edge 2 2 1 1   Control/stop worrying 2 1 2 1   Worry too much - different things 2 2 2 1   Trouble relaxing 2 2 1 1   Restless 2 1 2  0  Easily annoyed or irritable  2 2 2 1   Afraid - awful might happen 1 1 1 1   Total GAD 7 Score 13 11 11 6   Anxiety Difficulty Very difficult Very difficult Very difficult Somewhat difficult   Relevant past medical, surgical, family and social history reviewed and updated as indicated. Interim medical history since our last visit reviewed. Allergies and medications reviewed and updated.  Review of Systems  Constitutional:  Positive for fatigue. Negative for activity change, appetite change, diaphoresis and fever.  Respiratory:  Negative for cough, chest tightness, shortness of breath and wheezing.   Cardiovascular:  Negative for chest pain, palpitations and leg swelling.  Gastrointestinal: Negative.   Endocrine: Negative for cold intolerance, heat intolerance, polydipsia, polyphagia and polyuria.  Musculoskeletal:  Positive for back pain.  Neurological:  Positive for weakness and numbness. Negative for dizziness, syncope, facial asymmetry and light-headedness.  Psychiatric/Behavioral:  Positive for sleep disturbance. Negative for decreased concentration, self-injury and suicidal ideas. The patient is nervous/anxious.     Per HPI unless specifically indicated above    Objective:    BP 101/69 (BP Location: Left Arm, Patient Position: Sitting, Cuff Size: Normal)   Pulse 82   Temp 98.9 F (37.2 C) (Oral)   Resp 17   Ht 5' 5.98 (1.676 m)   Wt 165 lb (74.8 kg)   LMP  (LMP Unknown)   SpO2 98%   BMI 26.64 kg/m   Wt Readings  from Last 3 Encounters:  06/12/24 165 lb (74.8 kg)  12/07/23 168 lb 12.8 oz (76.6 kg)  11/29/23 167 lb 9.6 oz (76 kg)    Physical Exam Vitals and nursing note reviewed.  Constitutional:      General: She is awake. She is not in acute distress.    Appearance: She is well-developed and well-groomed. She is not ill-appearing or toxic-appearing.     Comments: Using cane for support.  HENT:     Head: Normocephalic.     Right Ear: Hearing and external ear normal.     Left Ear: Hearing and external ear normal.  Eyes:     General: Lids are normal.        Right eye: No discharge.        Left eye: No discharge.     Conjunctiva/sclera: Conjunctivae normal.     Pupils: Pupils are equal, round, and reactive to light.  Neck:     Thyroid : No thyromegaly.     Vascular: No carotid bruit.  Cardiovascular:     Rate and Rhythm: Normal rate and regular rhythm.     Heart sounds: Normal heart sounds. No murmur heard.    No gallop.  Pulmonary:     Effort: Pulmonary effort is normal. No accessory muscle usage or respiratory distress.     Breath sounds: Normal breath sounds. No decreased breath sounds, wheezing or rales.  Abdominal:     General: Bowel sounds are normal. There is no distension.     Palpations: Abdomen is soft.     Tenderness: There is no abdominal tenderness.  Musculoskeletal:     Cervical back: Normal range of motion and neck supple.     Right lower leg: No edema.     Left lower leg: No edema.  Lymphadenopathy:     Cervical: No cervical adenopathy.  Skin:    General: Skin is warm and dry.  Neurological:     Mental Status: She is alert and oriented to person, place, and time.     Cranial Nerves: Cranial nerves 2-12 are  intact.     Sensory: Sensory deficit (feet bilaterally) present.     Motor: Weakness (BLE) present.     Gait: Gait abnormal (slightly unsteady with cane as support).     Deep Tendon Reflexes: Reflexes are normal and symmetric.     Reflex Scores:       Brachioradialis reflexes are 2+ on the right side and 2+ on the left side.      Patellar reflexes are 2+ on the right side and 2+ on the left side. Psychiatric:        Attention and Perception: Attention normal.        Mood and Affect: Mood normal.        Speech: Speech normal.        Behavior: Behavior normal. Behavior is cooperative.        Thought Content: Thought content normal.    Results for orders placed or performed in visit on 06/12/24  Bayer DCA Hb A1c Waived   Collection Time: 06/12/24  1:32 PM  Result Value Ref Range   HB A1C (BAYER DCA - WAIVED) 9.8 (H) 4.8 - 5.6 %      Assessment & Plan:   Problem List Items Addressed This Visit       Cardiovascular and Mediastinum   Orthostatic hypotension   Stable. Recommend she start wearing compression hose on during day and off at night.  Could consider addition of Florinef or Midodrine if needed in future.  Recommend increase fluid intake at home. Continue to collaborate with cardiology as needed.      Relevant Medications   simvastatin  (ZOCOR ) 40 MG tablet   Hypertension associated with diabetes (HCC)   Chronic, stable.  Maintain off Ramipril  for now due to lower BP readings and continue Farxiga.  Recommend she continue to monitor BP at home daily and document + focus on DASH diet.  LABS: CMP, urine ALB today.  May need to consider Midodrine in future if low BP and falls.      Relevant Medications   dapagliflozin propanediol (FARXIGA) 10 MG TABS tablet   simvastatin  (ZOCOR ) 40 MG tablet   Other Relevant Orders   Bayer DCA Hb A1c Waived (Completed)   Comprehensive metabolic panel with GFR   Microalbumin, Urine Waived     Endocrine   Hyperlipidemia associated with type 2 diabetes mellitus (HCC)   Chronic, ongoing.  Continue current medication regimen and adjust as needed.  Obtain lipid panel today.        Relevant Medications   dapagliflozin propanediol (FARXIGA) 10 MG TABS tablet   simvastatin  (ZOCOR ) 40 MG tablet    Other Relevant Orders   Bayer DCA Hb A1c Waived (Completed)   Comprehensive metabolic panel with GFR   Lipid Panel w/o Chol/HDL Ratio   Diabetic neuropathy (HCC) - Primary   Chronic, ongoing, followed by endo.  A1c 9.8% today in office and urine ALB 30 in 2024, recheck urine today. She sees endo upcoming, will defer changes to them and provided her A1c result to take with her.  Continue current regimen at this time and continue collaboration with endo.   CMP today.  Gabapentin  & Lyrica caused dizziness.  Continue Amitriptyline  daily.  Referral to neurology for further testing and recommendations due to worsening balance and weakness + will work on getting manual wheelchair for her so she can attend visits and go on outings.  She is a higher fall risk. - Vaccinations up to date. - Statin on board, ACE on hold  due to low BP level. - Foot and Eye exam up to date.      Relevant Medications   dapagliflozin propanediol (FARXIGA) 10 MG TABS tablet   simvastatin  (ZOCOR ) 40 MG tablet   Other Relevant Orders   Bayer DCA Hb A1c Waived (Completed)   Ambulatory referral to Neurology   CKD stage 3 due to type 2 diabetes mellitus (HCC)   Chronic, ongoing, followed by endo.  A1c 9.8% today in office and urine ALB 30 in 2024, recheck urine today. She sees endo upcoming, will defer changes to them and provided her A1c result to take with her.  Continue current regimen at this time and continue collaboration with endo.   CMP today.  Gabapentin  & Lyrica caused dizziness.  Continue Amitriptyline  daily. Continues to collaborate with nephrology. - Vaccinations up to date. - Statin on board, ACE on hold due to low BP level. - Foot and Eye exam up to date.      Relevant Medications   dapagliflozin propanediol (FARXIGA) 10 MG TABS tablet   simvastatin  (ZOCOR ) 40 MG tablet   Other Relevant Orders   Bayer DCA Hb A1c Waived (Completed)   Comprehensive metabolic panel with GFR   Microalbumin, Urine Waived      Other   Vitamin D  deficiency   Ongoing, continue daily supplement and recheck Vit D level today.      Relevant Orders   VITAMIN D  25 Hydroxy (Vit-D Deficiency, Fractures)   Vitamin B12 deficiency   Ongoing, continue daily supplement and recheck B12 level today.      Relevant Orders   Vitamin B12   Long-term insulin  use (HCC)   Chronic, ongoing.  Continue collaboration with endocrinology.      Relevant Orders   Bayer DCA Hb A1c Waived (Completed)   Insomnia   Chronic, ongoing with poor control -- continues to have ongoing fatigue issues and poor sleep.  Did not tolerate Trazodone  nor benefit from it + no benefit from Melatonin.  Continue Amitriptyline  at this time.  Will see if can get Belsomra  covered at lowest dose. Consider sleep study if ongoing, she did not attend last time a referral was placed.      Depression   Chronic, ongoing. Will continue Prozac  40 MG daily and adjust regimen as needed.  Denies SI/HI.  Adjust medication as needed and monitor NA+ level.      Relevant Medications   amitriptyline  (ELAVIL ) 25 MG tablet   FLUoxetine  (PROZAC ) 40 MG capsule     Follow up plan: Return in about 8 weeks (around 08/07/2024) for NEUROPATHY, WEAKNESS.

## 2024-06-12 NOTE — Assessment & Plan Note (Signed)
 Chronic, ongoing, followed by endo.  A1c 9.8% today in office and urine ALB 30 in 2024, recheck urine today. She sees endo upcoming, will defer changes to them and provided her A1c result to take with her.  Continue current regimen at this time and continue collaboration with endo.   CMP today.  Gabapentin  & Lyrica caused dizziness.  Continue Amitriptyline  daily. Continues to collaborate with nephrology. - Vaccinations up to date. - Statin on board, ACE on hold due to low BP level. - Foot and Eye exam up to date.

## 2024-06-12 NOTE — Assessment & Plan Note (Signed)
 Stable. Recommend she start wearing compression hose on during day and off at night.  Could consider addition of Florinef or Midodrine if needed in future.  Recommend increase fluid intake at home. Continue to collaborate with cardiology as needed.

## 2024-06-12 NOTE — Assessment & Plan Note (Signed)
Chronic, ongoing.  Continue current medication regimen and adjust as needed.  Obtain lipid panel today. 

## 2024-06-12 NOTE — Assessment & Plan Note (Signed)
Ongoing, continue daily supplement and recheck B12 level today. 

## 2024-06-13 ENCOUNTER — Ambulatory Visit: Payer: Self-pay | Admitting: Nurse Practitioner

## 2024-06-13 LAB — COMPREHENSIVE METABOLIC PANEL WITH GFR
ALT: 19 IU/L (ref 0–32)
AST: 27 IU/L (ref 0–40)
Albumin: 4.3 g/dL (ref 3.8–4.8)
Alkaline Phosphatase: 94 IU/L (ref 44–121)
BUN/Creatinine Ratio: 12 (ref 12–28)
BUN: 18 mg/dL (ref 8–27)
Bilirubin Total: 0.4 mg/dL (ref 0.0–1.2)
CO2: 22 mmol/L (ref 20–29)
Calcium: 9.6 mg/dL (ref 8.7–10.3)
Chloride: 100 mmol/L (ref 96–106)
Creatinine, Ser: 1.48 mg/dL — ABNORMAL HIGH (ref 0.57–1.00)
Globulin, Total: 2.8 g/dL (ref 1.5–4.5)
Glucose: 109 mg/dL — ABNORMAL HIGH (ref 70–99)
Potassium: 4.7 mmol/L (ref 3.5–5.2)
Sodium: 140 mmol/L (ref 134–144)
Total Protein: 7.1 g/dL (ref 6.0–8.5)
eGFR: 37 mL/min/1.73 — ABNORMAL LOW (ref >59)

## 2024-06-13 LAB — LIPID PANEL W/O CHOL/HDL RATIO
Cholesterol, Total: 211 mg/dL — ABNORMAL HIGH (ref 100–199)
HDL: 54 mg/dL (ref >39)
LDL Chol Calc (NIH): 115 mg/dL — ABNORMAL HIGH (ref 0–99)
Triglycerides: 241 mg/dL — ABNORMAL HIGH (ref 0–149)
VLDL Cholesterol Cal: 42 mg/dL — ABNORMAL HIGH (ref 5–40)

## 2024-06-13 LAB — VITAMIN B12: Vitamin B-12: 364 pg/mL (ref 232–1245)

## 2024-06-13 LAB — VITAMIN D 25 HYDROXY (VIT D DEFICIENCY, FRACTURES): Vit D, 25-Hydroxy: 6.6 ng/mL — ABNORMAL LOW (ref 30.0–100.0)

## 2024-06-13 NOTE — Progress Notes (Signed)
 Good morning, please let Jessica Montoya know her labs have returned: - Kidneys continue to show Stage 3b kidney disease with no worsening, continue visit with kidney doctor. - Lipid panel is showing elevations from past check. Have you taken Rosuvastatin in past? Since nephrology stopped Simvastatin , Rosuvastatin may be a good alternate option to lower levels.  Currently levels have trended up a lot.  Do you want to try this? - Vitamin D  level very low and so is B12.  Ensure to take Vitamin D3 supplement daily, 2000 units and B12 1000 MCG.  Any questions? Keep being amazing!!  Thank you for allowing me to participate in your care.  I appreciate you. Kindest regards, Jona Zappone

## 2024-06-13 NOTE — Progress Notes (Signed)
 Called patient and left a message for her to call back to get scheduled for her AWV with the Nurse

## 2024-06-14 DIAGNOSIS — E1165 Type 2 diabetes mellitus with hyperglycemia: Secondary | ICD-10-CM | POA: Diagnosis not present

## 2024-06-14 DIAGNOSIS — E1129 Type 2 diabetes mellitus with other diabetic kidney complication: Secondary | ICD-10-CM | POA: Diagnosis not present

## 2024-06-14 DIAGNOSIS — Z794 Long term (current) use of insulin: Secondary | ICD-10-CM | POA: Diagnosis not present

## 2024-06-14 DIAGNOSIS — R809 Proteinuria, unspecified: Secondary | ICD-10-CM | POA: Diagnosis not present

## 2024-06-14 DIAGNOSIS — E1122 Type 2 diabetes mellitus with diabetic chronic kidney disease: Secondary | ICD-10-CM | POA: Diagnosis not present

## 2024-06-14 DIAGNOSIS — N1832 Chronic kidney disease, stage 3b: Secondary | ICD-10-CM | POA: Diagnosis not present

## 2024-06-14 DIAGNOSIS — E1142 Type 2 diabetes mellitus with diabetic polyneuropathy: Secondary | ICD-10-CM | POA: Diagnosis not present

## 2024-06-18 NOTE — Progress Notes (Signed)
 Called patient and left a message for her to call back to get scheduled for her AWV with the Nurse

## 2024-06-26 ENCOUNTER — Telehealth: Payer: Self-pay

## 2024-06-26 DIAGNOSIS — E1165 Type 2 diabetes mellitus with hyperglycemia: Secondary | ICD-10-CM | POA: Diagnosis not present

## 2024-06-26 NOTE — Telephone Encounter (Signed)
 Patient aware this is legit and nothing to be concerned with.

## 2024-06-26 NOTE — Telephone Encounter (Signed)
 Copied from CRM #[id number]. Topic: General - Other >> Jun 25, 2024  3:27 PM Charlet HERO wrote: Reason for CRM: Patient is calling bc she rcvd a text from [facility] about a wheel chair that had been ordered by her provider and she just wanted to make sure that it is legit, ckd chart did not see any notes about order for dme, please reach out to the patient with further asst.

## 2024-07-14 DIAGNOSIS — N183 Chronic kidney disease, stage 3 unspecified: Secondary | ICD-10-CM | POA: Diagnosis not present

## 2024-07-14 DIAGNOSIS — M5442 Lumbago with sciatica, left side: Secondary | ICD-10-CM | POA: Diagnosis not present

## 2024-07-19 DIAGNOSIS — E1142 Type 2 diabetes mellitus with diabetic polyneuropathy: Secondary | ICD-10-CM | POA: Diagnosis not present

## 2024-07-19 DIAGNOSIS — N1832 Chronic kidney disease, stage 3b: Secondary | ICD-10-CM | POA: Diagnosis not present

## 2024-07-19 DIAGNOSIS — E1122 Type 2 diabetes mellitus with diabetic chronic kidney disease: Secondary | ICD-10-CM | POA: Diagnosis not present

## 2024-07-19 DIAGNOSIS — E1129 Type 2 diabetes mellitus with other diabetic kidney complication: Secondary | ICD-10-CM | POA: Diagnosis not present

## 2024-07-19 DIAGNOSIS — Z794 Long term (current) use of insulin: Secondary | ICD-10-CM | POA: Diagnosis not present

## 2024-07-19 DIAGNOSIS — E1165 Type 2 diabetes mellitus with hyperglycemia: Secondary | ICD-10-CM | POA: Diagnosis not present

## 2024-07-19 DIAGNOSIS — M8580 Other specified disorders of bone density and structure, unspecified site: Secondary | ICD-10-CM | POA: Diagnosis not present

## 2024-07-19 DIAGNOSIS — R809 Proteinuria, unspecified: Secondary | ICD-10-CM | POA: Diagnosis not present

## 2024-07-22 NOTE — Patient Instructions (Incomplete)
 Nerve Damage (Peripheral Neuropathy): What to Know Peripheral neuropathy happens when there's damage to the peripheral nerves. These nerves send signals between your spinal cord and your arms and legs. You can have damage in one or more nerves. What are the causes? There are many reasons why nerves can get damaged. Damage may be caused by some diseases, such as: Diabetes. This is the most common cause. Autoimmune diseases, such as rheumatoid arthritis or lupus. These happen when your body's defense system (immune system) attacks healthy parts of your body by mistake. Inherited nerve disease. This is passed down in families. Kidney disease. Thyroid disease. Other causes include: Pressure or stress on a nerve that lasts a long time. Lack of B vitamins from poor diet or drinking too much alcohol. Infections. Some medicines, like chemotherapy used for cancer treatment. Poisonous (toxic) substances, such as lead or mercury. Too little blood flowing to the legs. Sometimes, the cause isn't known. What are the signs or symptoms? Symptoms depend on which nerves are damaged. In your legs, hands, or arms, you may have: Loss of feeling (numbness). Tingling. Burning pain. Very sensitive skin. Weakness. Paralysis. This is when you can't move a part of your body. Clumsiness. Muscle twitching. Loss of balance. You may have trouble walking. Other symptoms can include: Not being able to control when you pee. Feeling dizzy. Problems during sex. How is this diagnosed? Your health care provider will: Ask about your symptoms and medical history. Do a physical exam. Do a neurological exam. They will check your reflexes, how you move, and what you can feel. You may need to have other tests, such as: Blood tests. Nerve tests to check how well your nerves and muscles work. Imaging tests, such as a CT scan or MRI. These are done to rule out other causes. Nerve biopsy. This is when a small piece of a  nerve is removed for testing. Lumbar puncture. A small amount of the fluid that surrounds the brain and spinal cord is taken and checked in a lab. How is this treated? Treatment may include: Treating the cause of the nerve damage. Pain medicines. Other medicines that may help with pain, such as: Medicines that treat seizures. Medicines that treat depression. Pain patches or creams that are put on painful areas of skin. TENS therapy. This uses a device that sends mild electrical pulses to your nerves. This will prevent pain signals from reaching the brain. Massage. Acupuncture. Surgery to relieve pressure on a nerve or to destroy a nerve that's causing pain. This is done only if the other treatments are not helping. You may also need physical therapy to help improve your movement, balance, and muscle strength. You may be told to use a cane or walker if needed. Follow these instructions at home: Medicines Take your medicines only as told. You may need to take steps to help treat or prevent trouble pooping (constipation), such as: Taking medicines to help you poop. Eating foods high in fiber, like beans, whole grains, and fresh fruits and vegetables. Drinking more fluids as told. Ask your provider if it's safe to drive or use machines while taking your medicine. Lifestyle  Do not smoke, vape, or use nicotine or tobacco. Avoid or limit alcohol. Too much alcohol can be harmful to nerves and cause damage. Eat a healthy diet. Safety  Take care to avoid burning or damaging your skin if you have numbness. If you have numbness in your feet: Check your feet each day for signs of injury  or infection. Watch for redness, warmth, and swelling. Wear padded socks and comfortable shoes. These help protect your feet. General instructions If you have diabetes, keep your blood sugar under control. Develop a good support system. Living with nerve damage can cause a lot of stress. Talk with a mental  health therapist or join a support group. Exercise as told. Ask what things are safe for you to do at home. Work with a pain specialist to find the best way to manage your pain. Keep all follow-up visits. Your provider will check if the treatments are working and change them if needed. Where to find support The Foundation for Peripheral Neuropathy: [employer]si Where to find more information To learn more, go to: [employer] of Neurological Disorders and Stroke at Basic[employer]no. Click Search and type peripheral neuropathy. Find the link you need. Contact a health care provider if: Your pain treatments are not working. Your symptoms are getting worse. You have side effects from any of your medicines. You have new symptoms. You're having a hard time coping with your condition. Get help right away if: You have a wound that's not healing. You have new weakness in an arm or leg. You've fallen or you fall often. This information is not intended to replace advice given to you by your health care provider. Make sure you discuss any questions you have with your health care provider. Document Revised: 12/15/2023 Document Reviewed: 12/15/2023 Elsevier Patient Education  2025 [employer].

## 2024-07-25 ENCOUNTER — Ambulatory Visit: Admitting: Nurse Practitioner

## 2024-08-15 NOTE — Progress Notes (Signed)
 Jessica Montoya                                          MRN: [id number]   08/15/2024   The [facility] Quality Team Specialist reviewed this patient medical record for the purposes of chart review for care gap closure. The following were reviewed: chart review for care gap closure-kidney health evaluation for diabetes:eGFR  and uACR.    [facility] Quality Team

## 2024-08-15 NOTE — Progress Notes (Signed)
 Bonny Vanleeuwen                                          MRN: [id number]   08/15/2024   The [facility] Quality Team Specialist reviewed this patient medical record for the purposes of chart review for care gap closure. The following were reviewed: chart review for care gap closure-glycemic status assessment.    [facility] Quality Team

## 2024-09-19 NOTE — Progress Notes (Signed)
 Jessica Montoya                                          MRN: [id number]   09/19/2024   The [facility] Quality Team Specialist reviewed this patient medical record for the purposes of chart review for care gap closure. The following were reviewed: chart review for care gap closure-glycemic status assessment.    [facility] Quality Team

## 2024-10-26 NOTE — Progress Notes (Signed)
 Jessica Montoya                                          MRN: [id number]   10/26/2024   The [facility] Quality Team Specialist reviewed this patient medical record for the purposes of chart review for care gap closure. The following were reviewed: chart review for care gap closure-glycemic status assessment.    [facility] Quality Team

## 2024-11-02 NOTE — Progress Notes (Signed)
 Jessica Montoya                                          MRN: [id number]   11/02/2024   The [facility] Quality Team Specialist reviewed this patient medical record for the purposes of chart review for care gap closure. The following were reviewed: chart review for care gap closure-glycemic status assessment.    [facility] Quality Team

## 2024-11-29 ENCOUNTER — Ambulatory Visit
